# Patient Record
Sex: Female | Born: 1990 | Race: White | Hispanic: No | Marital: Single | Smoking: Former smoker
Health system: Southern US, Community
[De-identification: ages and names within clinical notes are randomized; demographics above are authoritative.]

## PROBLEM LIST (undated history)

## (undated) DIAGNOSIS — R000 Tachycardia, unspecified: Secondary | ICD-10-CM

## (undated) DIAGNOSIS — I499 Cardiac arrhythmia, unspecified: Secondary | ICD-10-CM

## (undated) DIAGNOSIS — F419 Anxiety disorder, unspecified: Secondary | ICD-10-CM

## (undated) DIAGNOSIS — R87629 Unspecified abnormal cytological findings in specimens from vagina: Secondary | ICD-10-CM

## (undated) DIAGNOSIS — F32A Depression, unspecified: Secondary | ICD-10-CM

## (undated) DIAGNOSIS — J45909 Unspecified asthma, uncomplicated: Secondary | ICD-10-CM

## (undated) HISTORY — DX: Unspecified abnormal cytological findings in specimens from vagina: R87.629

## (undated) HISTORY — DX: Unspecified asthma, uncomplicated: J45.909

## (undated) HISTORY — DX: Depression, unspecified: F32.A

## (undated) HISTORY — DX: Anxiety disorder, unspecified: F41.9

---
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
## 2009-02-20 ENCOUNTER — Emergency Department (HOSPITAL_COMMUNITY): Admission: EM | Admit: 2009-02-20 | Discharge: 2009-02-20 | Payer: Self-pay | Admitting: Emergency Medicine

## 2009-12-04 ENCOUNTER — Emergency Department (HOSPITAL_COMMUNITY): Admission: EM | Admit: 2009-12-04 | Discharge: 2009-12-04 | Payer: Self-pay | Admitting: Emergency Medicine

## 2010-06-27 ENCOUNTER — Emergency Department (HOSPITAL_COMMUNITY)
Admission: EM | Admit: 2010-06-27 | Discharge: 2010-06-28 | Disposition: A | Discharge status: Home / Self Care | Payer: Self-pay | Attending: Emergency Medicine | Admitting: Emergency Medicine

## 2010-06-27 DIAGNOSIS — I10 Essential (primary) hypertension: Secondary | ICD-10-CM | POA: Insufficient documentation

## 2010-06-27 DIAGNOSIS — L723 Sebaceous cyst: Secondary | ICD-10-CM | POA: Insufficient documentation

## 2010-12-07 LAB — CBC
HCT: 41.6
MCHC: 33.9
MCV: 85.8
Platelets: 314
RDW: 12.7
WBC: 11.1 — ABNORMAL HIGH

## 2010-12-07 LAB — BASIC METABOLIC PANEL
BUN: 13
CO2: 21
Chloride: 109
Glucose, Bld: 103 — ABNORMAL HIGH
Potassium: 4

## 2010-12-07 LAB — URINALYSIS, ROUTINE W REFLEX MICROSCOPIC
Bilirubin Urine: NEGATIVE
Ketones, ur: NEGATIVE
Nitrite: NEGATIVE
pH: 6

## 2010-12-07 LAB — DIFFERENTIAL
Eosinophils Absolute: 0.2
Eosinophils Relative: 2
Lymphs Abs: 4

## 2010-12-07 LAB — RAPID URINE DRUG SCREEN, HOSP PERFORMED
Benzodiazepines: NOT DETECTED
Cocaine: NOT DETECTED
Tetrahydrocannabinol: NOT DETECTED

## 2010-12-07 LAB — PREGNANCY, URINE: Preg Test, Ur: NEGATIVE

## 2012-04-06 ENCOUNTER — Other Ambulatory Visit: Payer: Self-pay

## 2012-04-06 ENCOUNTER — Emergency Department (HOSPITAL_COMMUNITY)
Admission: EM | Admit: 2012-04-06 | Discharge: 2012-04-06 | Disposition: A | Discharge status: Home / Self Care | Payer: Self-pay | Attending: Emergency Medicine | Admitting: Emergency Medicine

## 2012-04-06 ENCOUNTER — Encounter (HOSPITAL_COMMUNITY): Payer: Self-pay | Admitting: Emergency Medicine

## 2012-04-06 DIAGNOSIS — F1010 Alcohol abuse, uncomplicated: Secondary | ICD-10-CM | POA: Insufficient documentation

## 2012-04-06 DIAGNOSIS — F17200 Nicotine dependence, unspecified, uncomplicated: Secondary | ICD-10-CM | POA: Insufficient documentation

## 2012-04-06 DIAGNOSIS — R4182 Altered mental status, unspecified: Secondary | ICD-10-CM | POA: Insufficient documentation

## 2012-04-06 DIAGNOSIS — F10929 Alcohol use, unspecified with intoxication, unspecified: Secondary | ICD-10-CM

## 2012-04-06 HISTORY — DX: Tachycardia, unspecified: R00.0

## 2012-04-06 MED ORDER — SODIUM CHLORIDE 0.9 % IV BOLUS (SEPSIS)
1000.0000 mL | Freq: Once | INTRAVENOUS | Status: AC
  Administered 2012-04-06: 1000 mL via INTRAVENOUS

## 2012-04-06 NOTE — ED Notes (Addendum)
EMS brought patient in intoxicated from ETOH, pt admits to ETOH. Pt lethargic on triage but able to follow directions and answer questions. Vitals stable, resp stable. Denies any other drug use

## 2012-04-06 NOTE — ED Provider Notes (Signed)
History    22 year old female brought in by EMS for decreased mental status. Patient was sent home with her fianc when she laid down on the bed. He subsequently had difficulty arousing her. Patient had been drinking heavily earlier in the evening. Patient denies any other ingestion. Neysa Bonito was with her throughout the night. He is not concerned for any other ingestions aside from alcohol. No trauma. Patient in usual state of health earlier in the day. Currently her mental status is improved. Patient currently with no complaints. She has a past history of what sounds like some type of tachydysrhythmia and possibly an ablation. Currently denies any palpitations. No shortness of breath.   CSN: [id number]  Arrival date & time 04/06/12  0440   First MD Initiated Contact with Patient 04/06/12 [phone]      Chief Complaint  Patient presents with  . Alcohol Intoxication    (Consider location/radiation/quality/duration/timing/severity/associated sxs/prior treatment) HPI  Past Medical History  Diagnosis Date  . Tachycardia     Past Surgical History  Procedure Laterality Date  . Cardiac catheterization      states due to tacycardia    No family history on file.  History  Substance Use Topics  . Smoking status: Current Every Day Smoker  . Smokeless tobacco: Not on file  . Alcohol Use: Yes    OB History   Grav Para Term Preterm Abortions TAB SAB Ect Mult Living                  Review of Systems  All systems reviewed and negative, other than as noted in HPI.  Allergies  Review of patient's allergies indicates no known allergies.  Home Medications  No current outpatient prescriptions on file.  BP 136/98  Pulse 89  Temp(Src) 97.4 F (36.3 C) (Oral)  Ht 5\' 4"  (1.626 m)  Wt 132 lb (59.875 kg)  BMI 22.65 kg/m2  SpO2 100%  LMP 04/01/2012  Physical Exam  Nursing note and vitals reviewed. Constitutional: She appears well-developed and well-nourished. No distress.  No  external signs of acute trauma. Alcohol on breath.  HENT:  Head: Normocephalic and atraumatic.  Eyes: Pupils are equal, round, and reactive to light. Right eye exhibits no discharge. Left eye exhibits no discharge.  Conjunctiva injected bilaterally  Neck: Neck supple.  Cardiovascular: Normal rate, regular rhythm and normal heart sounds.  Exam reveals no gallop and no friction rub.   No murmur heard. Pulmonary/Chest: Effort normal and breath sounds normal. No respiratory distress.  Abdominal: Soft. She exhibits no distension. There is no tenderness.  Musculoskeletal: She exhibits no edema and no tenderness.  Neurological: She is alert. No cranial nerve deficit. She exhibits normal muscle tone. Coordination normal.  Speech is somewhat slow, but patient is answering questions appropriately.  Skin: Skin is warm and dry.  Psychiatric: Her behavior is normal. Thought content normal.    ED Course  Procedures (including critical care time)  Labs Reviewed - No data to display No results found.  EKG:  Rhythm: Sinus tachycardia Rate: 80 Axis: Normal Intervals: Normal ST segments: Nonspecific ST changes. There is some T wave flattening in precordial leads. Comparison: None  1. Alcohol intoxication       MDM  21yf with decreased mental status. Likely 2/2 etoh use. Drowsy on exam but protecting airway and is answering questions appropriately. Observed in ed for almost 2 hours with progressive improvement.         Raeford Razor, MD 04/06/12 [id number]

## 2014-03-04 LAB — OB RESULTS CONSOLE VARICELLA ZOSTER ANTIBODY, IGG: VARICELLA IGG: IMMUNE

## 2014-03-04 LAB — OB RESULTS CONSOLE HGB/HCT, BLOOD
HEMATOCRIT: 44 %
Hemoglobin: 14.3 g/dL

## 2014-03-04 LAB — OB RESULTS CONSOLE HEPATITIS B SURFACE ANTIGEN: HEP B S AG: NEGATIVE

## 2014-03-04 LAB — OB RESULTS CONSOLE ANTIBODY SCREEN: Antibody Screen: NEGATIVE

## 2014-03-04 LAB — OB RESULTS CONSOLE GC/CHLAMYDIA
Chlamydia: NEGATIVE
Gonorrhea: NEGATIVE

## 2014-03-04 LAB — OB RESULTS CONSOLE PLATELET COUNT: Platelets: 225 10*3/uL

## 2014-03-04 LAB — OB RESULTS CONSOLE RUBELLA ANTIBODY, IGM: RUBELLA: IMMUNE

## 2014-03-04 LAB — OB RESULTS CONSOLE ABO/RH: RH TYPE: POSITIVE

## 2014-03-04 LAB — OB RESULTS CONSOLE RPR: RPR: NONREACTIVE

## 2014-03-04 LAB — OB RESULTS CONSOLE HIV ANTIBODY (ROUTINE TESTING): HIV: NONREACTIVE

## 2014-03-04 LAB — HM PAP SMEAR: HM PAP: NEGATIVE

## 2014-04-26 DIAGNOSIS — I4719 Other supraventricular tachycardia: Secondary | ICD-10-CM

## 2014-04-26 DIAGNOSIS — Z8679 Personal history of other diseases of the circulatory system: Secondary | ICD-10-CM | POA: Insufficient documentation

## 2014-04-26 DIAGNOSIS — Z9889 Other specified postprocedural states: Secondary | ICD-10-CM

## 2014-04-26 DIAGNOSIS — I471 Supraventricular tachycardia: Secondary | ICD-10-CM | POA: Insufficient documentation

## 2014-04-26 HISTORY — DX: Other specified postprocedural states: Z98.890

## 2014-04-26 HISTORY — DX: Personal history of other diseases of the circulatory system: Z86.79

## 2014-04-26 HISTORY — DX: Other supraventricular tachycardia: I47.19

## 2014-04-26 HISTORY — DX: Supraventricular tachycardia: I47.1

## 2014-05-07 ENCOUNTER — Encounter: Payer: Self-pay | Admitting: *Deleted

## 2014-05-07 DIAGNOSIS — R87619 Unspecified abnormal cytological findings in specimens from cervix uteri: Secondary | ICD-10-CM | POA: Insufficient documentation

## 2014-05-11 ENCOUNTER — Ambulatory Visit (INDEPENDENT_AMBULATORY_CARE_PROVIDER_SITE_OTHER): Payer: Medicaid Other | Admitting: Advanced Practice Midwife

## 2014-05-11 ENCOUNTER — Encounter: Payer: Self-pay | Admitting: Advanced Practice Midwife

## 2014-05-11 VITALS — BP 100/60 | HR 72 | Wt 117.0 lb

## 2014-05-11 DIAGNOSIS — R000 Tachycardia, unspecified: Secondary | ICD-10-CM | POA: Insufficient documentation

## 2014-05-11 DIAGNOSIS — Z1389 Encounter for screening for other disorder: Secondary | ICD-10-CM

## 2014-05-11 DIAGNOSIS — Z3492 Encounter for supervision of normal pregnancy, unspecified, second trimester: Secondary | ICD-10-CM

## 2014-05-11 DIAGNOSIS — Z0283 Encounter for blood-alcohol and blood-drug test: Secondary | ICD-10-CM

## 2014-05-11 DIAGNOSIS — Z331 Pregnant state, incidental: Secondary | ICD-10-CM

## 2014-05-11 DIAGNOSIS — O09899 Supervision of other high risk pregnancies, unspecified trimester: Secondary | ICD-10-CM | POA: Insufficient documentation

## 2014-05-11 MED ORDER — CITRANATAL ASSURE 35-1 & 300 MG PO MISC: 1.0000 | Freq: Every day | ORAL | Status: DC

## 2014-05-11 NOTE — Progress Notes (Signed)
  Subjective:    Mary Meyer is a G1P0 6540w3d being seen today for her first obstetrical visit.  Her obstetrical history is significant for smoker.  Has decreased significantly Patient reports no complaints.  [facility]  Pt had a cardiac cath/ablation for SVT 2009 and thinks she had a stent placed, but records do not reflect this  Had consult 04/26/14 and report "all was normal".  Records requested   Filed Vitals:   05/11/14 1023  BP: 100/60  Pulse: 72  Weight: 117 lb (53.071 kg)    HISTORY: OB History  Gravida Para Term Preterm AB SAB TAB Ectopic Multiple Living  1             # Outcome Date GA Lbr Len/2nd Weight Sex Delivery Anes PTL Lv  1 Current              Past Medical History  Diagnosis Date  . Tachycardia    Past Surgical History  Procedure Laterality Date  . Cardiac catheterization  2009    states due to tacycardia, had alblation  . Cardiac catheterization  2009   History reviewed. No pertinent family history.   Exam                       Uterus Normal, Gravid, FH: u-3                System:     Skin: normal coloration and turgor, no rashes    Neurologic: oriented, normal, normal mood   Extremities: normal strength, tone, and muscle mass   HEENT PERRLA   Mouth/Teeth mucous membranes moist, normal dentition   Neck supple and no masses   Cardiovascular: regular rate and rhythm   Respiratory:  appears well, vitals normal, no respiratory distress, acyanotic   Abdomen: soft, non-tender;  FHR: 157          Assessment:    Pregnancy: G1P0 Patient Active Problem List   Diagnosis Date Noted  . Supervision of normal pregnancy 05/11/2014  . Tachycardia   . Abnormal Pap smear of cervix 05/07/2014        Plan:     IContinue prenatal vitamins  Problem list reviewed and updated  Reviewed recommended weight gain based on pre-gravid BMI  Encouraged well-balanced diet Genetic Screening  discussed Integrated Screen: normal.  Ultrasound discussed; fetal survey: requested.  Follow up in 1 weeks for anatomy scan.  CRESENZO-DISHMAN,Yamili Lichtenwalner 05/11/2014

## 2014-05-11 NOTE — Progress Notes (Signed)
New OB visit here at our office. [facility].

## 2014-05-12 LAB — PMP SCREEN PROFILE (10S), URINE
Amphetamine Screen, Ur: NEGATIVE ng/mL
BARBITURATE SCRN UR: NEGATIVE ng/mL
BENZODIAZEPINE SCREEN, URINE: NEGATIVE ng/mL
CREATININE(CRT), U: 93.8 mg/dL (ref 20.0–300.0)
Cannabinoids Ur Ql Scn: NEGATIVE ng/mL
Cocaine(Metab.)Screen, Urine: NEGATIVE ng/mL
METHADONE SCREEN, URINE: NEGATIVE ng/mL
OXYCODONE+OXYMORPHONE UR QL SCN: NEGATIVE ng/mL
Opiate Scrn, Ur: NEGATIVE ng/mL
PCP Scrn, Ur: NEGATIVE ng/mL
PROPOXYPHENE SCREEN: NEGATIVE ng/mL
Ph of Urine: 7.8 (ref 4.5–8.9)

## 2014-05-18 ENCOUNTER — Other Ambulatory Visit: Payer: Self-pay | Admitting: Advanced Practice Midwife

## 2014-05-18 DIAGNOSIS — Z3689 Encounter for other specified antenatal screening: Secondary | ICD-10-CM

## 2014-05-19 ENCOUNTER — Ambulatory Visit (INDEPENDENT_AMBULATORY_CARE_PROVIDER_SITE_OTHER): Payer: Medicaid Other

## 2014-05-19 ENCOUNTER — Ambulatory Visit (INDEPENDENT_AMBULATORY_CARE_PROVIDER_SITE_OTHER): Payer: Medicaid Other | Admitting: Advanced Practice Midwife

## 2014-05-19 VITALS — BP 114/62 | HR 80 | Wt 121.0 lb

## 2014-05-19 DIAGNOSIS — Z1389 Encounter for screening for other disorder: Secondary | ICD-10-CM

## 2014-05-19 DIAGNOSIS — Z331 Pregnant state, incidental: Secondary | ICD-10-CM

## 2014-05-19 DIAGNOSIS — Z36 Encounter for antenatal screening of mother: Secondary | ICD-10-CM

## 2014-05-19 DIAGNOSIS — Z3689 Encounter for other specified antenatal screening: Secondary | ICD-10-CM

## 2014-05-19 DIAGNOSIS — Z3492 Encounter for supervision of normal pregnancy, unspecified, second trimester: Secondary | ICD-10-CM

## 2014-05-19 LAB — POCT URINALYSIS DIPSTICK
Blood, UA: NEGATIVE
GLUCOSE UA: NEGATIVE
Glucose, UA: NEGATIVE
Ketones, UA: NEGATIVE
NITRITE UA: NEGATIVE
PROTEIN UA: NEGATIVE
Protein, UA: NEGATIVE

## 2014-05-19 NOTE — Progress Notes (Signed)
G1P0 4752w4d Estimated Date of Delivery: 10/09/14  Blood pressure 114/62, pulse 80, weight 121 lb (54.885 kg), last menstrual period 01/11/2014.   BP weight and urine results all reviewed and noted.  Please refer to the obstetrical flow sheet for the fundal height and fetal heart rate documentation: Had anatomy scan today:  U/S 4815w1d active fetus, pos fht 157bpm,ant pl grade 0,bilat adnexa wnl,sdp of fluid 5.34cm,anatomy appears to be normal,female  Patient reports good fetal movement, denies any bleeding and no rupture of membranes symptoms or regular contractions. Patient is without complaints. All questions were answered.  Plan:  Continued routine obstetrical care,   Follow up in 4 weeks for OB appointment,

## 2014-05-19 NOTE — Progress Notes (Signed)
U/S 3863w1d active fetus, pos fht 157bpm,ant pl grade 0,bilat adnexa wnl,sdp of fluid 5.34cm,anatomy appears to be normal,female

## 2014-06-16 ENCOUNTER — Ambulatory Visit (INDEPENDENT_AMBULATORY_CARE_PROVIDER_SITE_OTHER): Payer: Medicaid Other | Admitting: Women's Health

## 2014-06-16 ENCOUNTER — Encounter: Payer: Self-pay | Admitting: Women's Health

## 2014-06-16 VITALS — BP 110/60 | HR 88 | Wt 129.5 lb

## 2014-06-16 DIAGNOSIS — Z1389 Encounter for screening for other disorder: Secondary | ICD-10-CM

## 2014-06-16 DIAGNOSIS — Z23 Encounter for immunization: Secondary | ICD-10-CM

## 2014-06-16 DIAGNOSIS — Z3402 Encounter for supervision of normal first pregnancy, second trimester: Secondary | ICD-10-CM

## 2014-06-16 DIAGNOSIS — Z331 Pregnant state, incidental: Secondary | ICD-10-CM

## 2014-06-16 LAB — POCT URINALYSIS DIPSTICK
Blood, UA: NEGATIVE
GLUCOSE UA: NEGATIVE
KETONES UA: NEGATIVE
NITRITE UA: NEGATIVE
Protein, UA: NEGATIVE

## 2014-06-16 NOTE — Progress Notes (Signed)
Low-risk OB appointment G1P0 7480w4d Estimated Date of Delivery: 10/09/14 BP 110/60 mmHg  Pulse 88  Wt 129 lb 8 oz (58.741 kg)  LMP 01/11/2014  BP, weight, and urine reviewed.  Refer to obstetrical flow sheet for FH & FHR.  Reports good fm.  Denies regular uc's, lof, vb, or uti s/s. No complaints. No circ. Discussed cb classes- fob works 2nd so will be unable to attend and she doesn't have anyone else to take her. [facility]  Does not want to drink glucola next visit- has read a lot about it and is concerned w/ chemicals, etc. Wants to do 28 brach's jelly beans- understands this is not as sensitive. Discussed peds- her sister does not take her kids to peds and does home/natural remedies and she is looking into this. Advised against this- discussed local peds & info given.  Reviewed ptl s/s, fm. Plan:  Continue routine obstetrical care  F/U in 4wks for OB appointment and pn2 w/ jelly beans

## 2014-06-16 NOTE — Addendum Note (Signed)
Addended by: Richardson ChiquitoRAVIS, ASHLEY M on: 06/16/2014 11:21 AM   Modules accepted: Orders

## 2014-06-16 NOTE — Patient Instructions (Signed)
BRING BRACH'S JELLY BEANS WITH YOU (YOU WILL NEED 28)  [place] Pediatricians/Family Doctors:  [facility] [phone]            [facility] [phone]                 [facility] [phone] (usually not accepting new patients unless you have family there already, you are always welcome to call and ask)            [facility] [phone]([address] [place]) [phone]   [facility] Pediatricians/Family Doctors:   [facility]: [phone]  [facility]: [phone]   You will have your sugar test next visit.  Please do not eat or drink anything after midnight the night before you come, not even water.  You will be here for at least two hours.     Call the office [phone]) or go to [facility] if:  You begin to have strong, frequent contractions  Your water breaks.  Sometimes it is a big gush of fluid, sometimes it is just a trickle that keeps getting your panties wet or running down your legs  You have vaginal bleeding.  It is normal to have a small amount of spotting if your cervix was checked.   You don't feel your baby moving like normal.  If you don't, get you something to eat and drink and lay down and focus on feeling your baby move.   If your baby is still not moving like normal, you should call the office or go to [facility].  Second Trimester of Pregnancy The second trimester is from week 13 through week 28, months 4 through 6. The second trimester is often a time when you feel your best. Your body has also adjusted to being pregnant, and you begin to feel better physically. Usually, morning sickness has lessened or quit completely, you may have more energy, and you may have an increase in appetite. The second trimester is also a time when the fetus is growing rapidly. At the end of the sixth month, the fetus is about 9 inches long and weighs about 1 pounds. You will likely begin to  feel the baby move (quickening) between 18 and 20 weeks of the pregnancy. BODY CHANGES Your body goes through many changes during pregnancy. The changes vary from woman to woman.   Your weight will continue to increase. You will notice your lower abdomen bulging out.  You may begin to get stretch marks on your hips, abdomen, and breasts.  You may develop headaches that can be relieved by medicines approved by your health care provider.  You may urinate more often because the fetus is pressing on your bladder.  You may develop or continue to have heartburn as a result of your pregnancy.  You may develop constipation because certain hormones are causing the muscles that push waste through your intestines to slow down.  You may develop hemorrhoids or swollen, bulging veins (varicose veins).  You may have back pain because of the weight gain and pregnancy hormones relaxing your joints between the bones in your pelvis and as a result of a shift in weight and the muscles that support your balance.  Your breasts will continue to grow and be tender.  Your gums may bleed and may be sensitive to brushing and flossing.  Dark spots or blotches (chloasma, mask of pregnancy) may develop on your face. This will likely fade after the baby is born.  A dark line from your belly button to the pubic area (linea nigra) may appear. This will likely fade after the baby is born.  You may have changes in your hair. These can include thickening of your hair, rapid growth, and changes in texture. Some women also have hair loss during or after pregnancy, or hair that feels dry or thin. Your hair will most likely return to normal after your baby is born. WHAT TO EXPECT AT YOUR PRENATAL VISITS During a routine prenatal visit:  You will be weighed to make sure you and the fetus are growing normally.  Your blood pressure will be taken.  Your abdomen will be measured to track your baby's growth.  The fetal  heartbeat will be listened to.  Any test results from the previous visit will be discussed. Your health care provider may ask you:  How you are feeling.  If you are feeling the baby move.  If you have had any abnormal symptoms, such as leaking fluid, bleeding, severe headaches, or abdominal cramping.  If you have any questions. Other tests that may be performed during your second trimester include:  Blood tests that check for:  Low iron levels (anemia).  Gestational diabetes (between 24 and 28 weeks).  Rh antibodies.  Urine tests to check for infections, diabetes, or protein in the urine.  An ultrasound to confirm the proper growth and development of the baby.  An amniocentesis to check for possible genetic problems.  Fetal screens for spina bifida and Down syndrome. HOME CARE INSTRUCTIONS   Avoid all smoking, herbs, alcohol, and unprescribed drugs. These chemicals affect the formation and growth of the baby.  Follow your health care provider's instructions regarding medicine use. There are medicines that are either safe or unsafe to take during pregnancy.  Exercise only as directed by your health care provider. Experiencing uterine cramps is a good sign to stop exercising.  Continue to eat regular, healthy meals.  Wear a good support bra for breast tenderness.  Do not use hot tubs, steam rooms, or saunas.  Wear your seat belt at all times when driving.  Avoid raw meat, uncooked cheese, cat litter boxes, and soil used by cats. These carry germs that can cause birth defects in the baby.  Take your prenatal vitamins.  Try taking a stool softener (if your health care provider approves) if you develop constipation. Eat more high-fiber foods, such as fresh vegetables or fruit and whole grains. Drink plenty of fluids to keep your urine clear or pale yellow.  Take warm sitz baths to soothe any pain or discomfort caused by hemorrhoids. Use hemorrhoid cream if your health  care provider approves.  If you develop varicose veins, wear support hose. Elevate your feet for 15 minutes, 3-4 times a day. Limit salt in your diet.  Avoid heavy lifting, wear low heel shoes, and practice good posture.  Rest with your legs elevated if you have leg cramps or low back pain.  Visit your dentist if you have not gone yet during your pregnancy. Use a soft toothbrush to brush your teeth and be gentle when you floss.  A sexual relationship may be continued unless your health care provider directs you otherwise.  Continue to go to all your prenatal visits as directed by your health care provider. SEEK MEDICAL CARE IF:   You have dizziness.  You have mild pelvic cramps, pelvic pressure, or nagging pain in the abdominal area.  You have persistent nausea, vomiting, or diarrhea.  You have a bad smelling vaginal discharge.  You have pain with urination. SEEK IMMEDIATE MEDICAL CARE IF:   You have a fever.  You are leaking fluid from your vagina.  You have spotting or bleeding from your vagina.  You have severe abdominal cramping or pain.  You have rapid weight gain or loss.  You have shortness of breath with chest pain.  You notice sudden or extreme swelling of your face, hands, ankles, feet, or legs.  You have not felt your baby move in over an hour.  You have severe headaches that do not go away with medicine.  You have vision changes. Document Released: 02/06/2001 Document Revised: 02/17/2013 Document Reviewed: 04/15/2012 [facility] Patient Information 2015 [place], [place]. This information is not intended to replace advice given to you by your health care provider. Make sure you discuss any questions you have with your health care provider.

## 2014-07-14 ENCOUNTER — Ambulatory Visit (INDEPENDENT_AMBULATORY_CARE_PROVIDER_SITE_OTHER): Payer: Medicaid Other | Admitting: Women's Health

## 2014-07-14 ENCOUNTER — Other Ambulatory Visit: Payer: Medicaid Other

## 2014-07-14 ENCOUNTER — Encounter: Payer: Self-pay | Admitting: Women's Health

## 2014-07-14 VITALS — BP 110/62 | HR 68 | Wt 137.0 lb

## 2014-07-14 DIAGNOSIS — Z3402 Encounter for supervision of normal first pregnancy, second trimester: Secondary | ICD-10-CM

## 2014-07-14 DIAGNOSIS — Z331 Pregnant state, incidental: Secondary | ICD-10-CM

## 2014-07-14 DIAGNOSIS — Z369 Encounter for antenatal screening, unspecified: Secondary | ICD-10-CM

## 2014-07-14 DIAGNOSIS — Z131 Encounter for screening for diabetes mellitus: Secondary | ICD-10-CM

## 2014-07-14 DIAGNOSIS — Z1389 Encounter for screening for other disorder: Secondary | ICD-10-CM

## 2014-07-14 DIAGNOSIS — R000 Tachycardia, unspecified: Secondary | ICD-10-CM

## 2014-07-14 LAB — POCT URINALYSIS DIPSTICK
Blood, UA: NEGATIVE
Glucose, UA: NEGATIVE
Ketones, UA: NEGATIVE
Leukocytes, UA: NEGATIVE
NITRITE UA: NEGATIVE
Protein, UA: NEGATIVE

## 2014-07-14 NOTE — Patient Instructions (Signed)

## 2014-07-14 NOTE — Progress Notes (Signed)
Low-risk OB appointment G1P0 3342w4d Estimated Date of Delivery: 10/09/14 BP 110/62 mmHg  Pulse 68  Wt 137 lb (62.143 kg)  LMP 01/11/2014  BP, weight, and urine reviewed.  Refer to obstetrical flow sheet for FH & FHR.  Reports good fm.  Denies regular uc's, lof, vb, or uti s/s. No complaints. Reviewed ptl s/s, fkc. Recommended Tdap at HD/PCP per CDC guidelines.  Plan:  Continue routine obstetrical care  F/U in 3wks for OB appointment  PN2 today (decided not to do jelly beans, did glucola)

## 2014-07-15 LAB — CBC
HEMATOCRIT: 35.6 % (ref 34.0–46.6)
HEMOGLOBIN: 11.9 g/dL (ref 11.1–15.9)
MCH: 30.9 pg (ref 26.6–33.0)
MCHC: 33.4 g/dL (ref 31.5–35.7)
MCV: 93 fL (ref 79–97)
Platelets: 198 10*3/uL (ref 150–379)
RBC: 3.85 x10E6/uL (ref 3.77–5.28)
RDW: 13.9 % (ref 12.3–15.4)
WBC: 9.4 10*3/uL (ref 3.4–10.8)

## 2014-07-15 LAB — ANTIBODY SCREEN: ANTIBODY SCREEN: NEGATIVE

## 2014-07-15 LAB — GLUCOSE TOLERANCE, 2 HOURS W/ 1HR
Glucose, 1 hour: 99 mg/dL (ref 65–179)
Glucose, 2 hour: 97 mg/dL (ref 65–152)
Glucose, Fasting: 71 mg/dL (ref 65–91)

## 2014-07-15 LAB — HIV ANTIBODY (ROUTINE TESTING W REFLEX): HIV SCREEN 4TH GENERATION: NONREACTIVE

## 2014-07-15 LAB — RPR: RPR Ser Ql: NONREACTIVE

## 2014-07-15 LAB — HSV 2 ANTIBODY, IGG: HSV 2 Glycoprotein G Ab, IgG: <0.91 index (ref 0.00–0.90)

## 2014-07-19 ENCOUNTER — Ambulatory Visit (INDEPENDENT_AMBULATORY_CARE_PROVIDER_SITE_OTHER): Payer: Medicaid Other | Admitting: Advanced Practice Midwife

## 2014-07-19 ENCOUNTER — Encounter: Payer: Self-pay | Admitting: Advanced Practice Midwife

## 2014-07-19 VITALS — BP 102/58 | HR 72 | Wt 137.0 lb

## 2014-07-19 DIAGNOSIS — O368131 Decreased fetal movements, third trimester, fetus 1: Secondary | ICD-10-CM

## 2014-07-19 DIAGNOSIS — Z331 Pregnant state, incidental: Secondary | ICD-10-CM

## 2014-07-19 DIAGNOSIS — Z1389 Encounter for screening for other disorder: Secondary | ICD-10-CM

## 2014-07-19 LAB — POCT URINALYSIS DIPSTICK
GLUCOSE UA: NEGATIVE
KETONES UA: NEGATIVE
LEUKOCYTES UA: NEGATIVE
Nitrite, UA: NEGATIVE
Protein, UA: NEGATIVE
RBC UA: NEGATIVE

## 2014-07-19 NOTE — Progress Notes (Signed)
WORK IN:  FELL ON HANDS AND KNEES @ 1700 YESTERDAY; DECREASED fm TODAY, no bleeding G1P0 6676w2d Estimated Date of Delivery: 10/09/14  Blood pressure 102/58, pulse 72, weight 137 lb (62.143 kg), last menstrual period 01/11/2014.   BP weight and urine results all reviewed and noted..NST reassuring with moderate variability and 10X10 accels; no uterine activity, uterus soft, nontender.  All questions were answered.  Plan:  Continued routine obstetrical care, pt reassured and advised to seek FU care asap if she falls agian  Follow up in as scheduled for OB appointment,

## 2014-08-04 ENCOUNTER — Ambulatory Visit (INDEPENDENT_AMBULATORY_CARE_PROVIDER_SITE_OTHER): Payer: Medicaid Other | Admitting: Advanced Practice Midwife

## 2014-08-04 VITALS — BP 92/54 | HR 77 | Wt 140.5 lb

## 2014-08-04 DIAGNOSIS — Z3402 Encounter for supervision of normal first pregnancy, second trimester: Secondary | ICD-10-CM

## 2014-08-04 DIAGNOSIS — Z1389 Encounter for screening for other disorder: Secondary | ICD-10-CM

## 2014-08-04 DIAGNOSIS — Z3403 Encounter for supervision of normal first pregnancy, third trimester: Secondary | ICD-10-CM

## 2014-08-04 DIAGNOSIS — Z331 Pregnant state, incidental: Secondary | ICD-10-CM

## 2014-08-04 LAB — POCT URINALYSIS DIPSTICK
Blood, UA: NEGATIVE
GLUCOSE UA: NEGATIVE
Ketones, UA: NEGATIVE
LEUKOCYTES UA: NEGATIVE
NITRITE UA: NEGATIVE
Protein, UA: NEGATIVE

## 2014-08-04 NOTE — Progress Notes (Signed)
G1P0 3113w4d Estimated Date of Delivery: 10/09/14  Blood pressure 92/54, pulse 77, weight 140 lb 8 oz (63.73 kg), last menstrual period 01/11/2014.   BP weight and urine results all reviewed and noted.  Please refer to the obstetrical flow sheet for the fundal height and fetal heart rate documentation:  Patient reports good fetal movement, denies any bleeding and no rupture of membranes symptoms or regular contractions. Patient c/o hemorrhoids that sometimes hurt sometimes not.  Declines exam.  All questions were answered.  Plan:  Continued routine obstetrical care,   Follow up in 2 weeks for OB appointment,

## 2014-08-18 ENCOUNTER — Encounter: Payer: Self-pay | Admitting: Women's Health

## 2014-08-18 ENCOUNTER — Ambulatory Visit (INDEPENDENT_AMBULATORY_CARE_PROVIDER_SITE_OTHER): Payer: Medicaid Other | Admitting: Women's Health

## 2014-08-18 VITALS — BP 104/62 | HR 68 | Wt 142.0 lb

## 2014-08-18 DIAGNOSIS — Z3403 Encounter for supervision of normal first pregnancy, third trimester: Secondary | ICD-10-CM

## 2014-08-18 DIAGNOSIS — Z1389 Encounter for screening for other disorder: Secondary | ICD-10-CM

## 2014-08-18 DIAGNOSIS — Z331 Pregnant state, incidental: Secondary | ICD-10-CM

## 2014-08-18 LAB — POCT URINALYSIS DIPSTICK
Blood, UA: NEGATIVE
Glucose, UA: NEGATIVE
Ketones, UA: NEGATIVE
LEUKOCYTES UA: NEGATIVE
Nitrite, UA: NEGATIVE
PROTEIN UA: NEGATIVE

## 2014-08-18 NOTE — Progress Notes (Signed)
Low-risk OB appointment G1P0 [redacted]w[redacted]d Estimated Date of Delivery: 10/09/14 BP 104/62 mmHg  Pulse 68  Wt 142 lb (64.411 kg)  LMP 01/11/2014  BP, weight, and urine reviewed.  Refer to obstetrical flow sheet for FH & FHR.  Reports good fm.  Denies regular uc's, lof, vb, or uti s/s. No complaints. Picked out ped. Wants to go to tour- gave info again.  Reviewed ptl s/s, fkc. Plan:  Continue routine obstetrical care  F/U in 2wks for OB appointment

## 2014-08-18 NOTE — Patient Instructions (Signed)
Call the office ([phone]) or go to [facility] if:  You begin to have strong, frequent contractions  Your water breaks.  Sometimes it is a big gush of fluid, sometimes it is just a trickle that keeps getting your panties wet or running down your legs  You have vaginal bleeding.  It is normal to have a small amount of spotting if your cervix was checked.   You don't feel your baby moving like normal.  If you don't, get you something to eat and drink and lay down and focus on feeling your baby move.  You should feel at least 10 movements in 2 hours.  If you don't, you should call the office or go to [facility].    Preterm Labor Information Preterm labor is when labor starts at less than 37 weeks of pregnancy. The normal length of a pregnancy is 39 to 41 weeks. CAUSES Often, there is no identifiable underlying cause as to why a woman goes into preterm labor. One of the most common known causes of preterm labor is infection. Infections of the uterus, cervix, vagina, amniotic sac, bladder, kidney, or even the lungs (pneumonia) can cause labor to start. Other suspected causes of preterm labor include:   Urogenital infections, such as yeast infections and bacterial vaginosis.   Uterine abnormalities (uterine shape, uterine septum, fibroids, or bleeding from the placenta).   A cervix that has been operated on (it may fail to stay closed).   Malformations in the fetus.   Multiple gestations (twins, triplets, and so on).   Breakage of the amniotic sac.  RISK FACTORS  Having a previous history of preterm labor.   Having premature rupture of membranes (PROM).   Having a placenta that covers the opening of the cervix (placenta previa).   Having a placenta that separates from the uterus (placental abruption).   Having a cervix that is too weak to hold the fetus in the uterus (incompetent cervix).   Having too much fluid in the amniotic sac (polyhydramnios).   Taking  illegal drugs or smoking while pregnant.   Not gaining enough weight while pregnant.   Being younger than 18 and older than 25 years old.   Having a low socioeconomic status.   Being African American. SYMPTOMS Signs and symptoms of preterm labor include:   Menstrual-like cramps, abdominal pain, or back pain.  Uterine contractions that are regular, as frequent as six in an hour, regardless of their intensity (may be mild or painful).  Contractions that start on the top of the uterus and spread down to the lower abdomen and back.   A sense of increased pelvic pressure.   A watery or bloody mucus discharge that comes from the vagina.  TREATMENT Depending on the length of the pregnancy and other circumstances, your health care provider may suggest bed rest. If necessary, there are medicines that can be given to stop contractions and to mature the fetal lungs. If labor happens before 34 weeks of pregnancy, a prolonged hospital stay may be recommended. Treatment depends on the condition of both you and the fetus.  WHAT SHOULD YOU DO IF YOU THINK YOU ARE IN PRETERM LABOR? Call your health care provider right away. You will need to go to the hospital to get checked immediately. HOW CAN YOU PREVENT PRETERM LABOR IN FUTURE PREGNANCIES? You should:   Stop smoking if you smoke.  Maintain healthy weight gain and avoid chemicals and drugs that are not necessary.  Be watchful for   any type of infection.  Inform your health care provider if you have a known history of preterm labor. Document Released: 05/05/2003 Document Revised: 10/15/2012 Document Reviewed: 03/17/2012 [employer] Patient Information 2015 [employer]. This information is not intended to replace advice given to you by your health care provider. Make sure you discuss any questions you have with your health care provider.  

## 2014-08-25 ENCOUNTER — Inpatient Hospital Stay (HOSPITAL_COMMUNITY): Admission: AD | Admit: 2014-08-25 | Payer: Self-pay | Source: Ambulatory Visit | Admitting: Obstetrics & Gynecology

## 2014-09-01 ENCOUNTER — Encounter: Payer: Self-pay | Admitting: Obstetrics and Gynecology

## 2014-09-01 ENCOUNTER — Ambulatory Visit (INDEPENDENT_AMBULATORY_CARE_PROVIDER_SITE_OTHER): Payer: Medicaid Other | Admitting: Obstetrics and Gynecology

## 2014-09-01 VITALS — BP 110/60 | HR 84 | Wt 146.0 lb

## 2014-09-01 DIAGNOSIS — Z3402 Encounter for supervision of normal first pregnancy, second trimester: Secondary | ICD-10-CM

## 2014-09-01 DIAGNOSIS — Z1389 Encounter for screening for other disorder: Secondary | ICD-10-CM

## 2014-09-01 DIAGNOSIS — Z331 Pregnant state, incidental: Secondary | ICD-10-CM

## 2014-09-01 LAB — POCT URINALYSIS DIPSTICK
GLUCOSE UA: NEGATIVE
Ketones, UA: NEGATIVE
Leukocytes, UA: NEGATIVE
NITRITE UA: NEGATIVE
PROTEIN UA: NEGATIVE
RBC UA: NEGATIVE

## 2014-09-01 NOTE — Progress Notes (Signed)
Pt denies any problems or concerns at this time. LROB G1P0 2822w4d Estimated Date of Delivery: 10/09/14  Blood pressure 110/60, pulse 84, weight 146 lb (66.225 kg), last menstrual period 01/11/2014.   refer to the ob flow sheet for FH and FHR, also BP, Wt, Urine results:notable for negative  Patient reports   good fetal movement, denies any bleeding and no rupture of membranes symptoms or regular contractions. Patient complaints:none , has a few braxton hicks. .  Questions were answered. Assessment: LROB, NO COMPLAINTS G1P0 Plan:  Continued routine obstetrical care,   F/u in 2 weeks for lrob visit

## 2014-09-15 ENCOUNTER — Ambulatory Visit (INDEPENDENT_AMBULATORY_CARE_PROVIDER_SITE_OTHER): Payer: Medicaid Other

## 2014-09-15 ENCOUNTER — Other Ambulatory Visit: Payer: Self-pay | Admitting: Women's Health

## 2014-09-15 ENCOUNTER — Ambulatory Visit (INDEPENDENT_AMBULATORY_CARE_PROVIDER_SITE_OTHER): Payer: Medicaid Other | Admitting: Women's Health

## 2014-09-15 ENCOUNTER — Encounter: Payer: Self-pay | Admitting: Women's Health

## 2014-09-15 VITALS — BP 120/68 | HR 76 | Wt 148.0 lb

## 2014-09-15 DIAGNOSIS — Z1159 Encounter for screening for other viral diseases: Secondary | ICD-10-CM

## 2014-09-15 DIAGNOSIS — O26843 Uterine size-date discrepancy, third trimester: Secondary | ICD-10-CM | POA: Diagnosis not present

## 2014-09-15 DIAGNOSIS — Z3403 Encounter for supervision of normal first pregnancy, third trimester: Secondary | ICD-10-CM

## 2014-09-15 DIAGNOSIS — O403XX0 Polyhydramnios, third trimester, not applicable or unspecified: Secondary | ICD-10-CM | POA: Insufficient documentation

## 2014-09-15 DIAGNOSIS — Z118 Encounter for screening for other infectious and parasitic diseases: Secondary | ICD-10-CM

## 2014-09-15 DIAGNOSIS — O403XX1 Polyhydramnios, third trimester, fetus 1: Secondary | ICD-10-CM

## 2014-09-15 DIAGNOSIS — Z1389 Encounter for screening for other disorder: Secondary | ICD-10-CM

## 2014-09-15 DIAGNOSIS — Z3685 Encounter for antenatal screening for Streptococcus B: Secondary | ICD-10-CM

## 2014-09-15 DIAGNOSIS — Z331 Pregnant state, incidental: Secondary | ICD-10-CM

## 2014-09-15 DIAGNOSIS — O09893 Supervision of other high risk pregnancies, third trimester: Secondary | ICD-10-CM

## 2014-09-15 LAB — OB RESULTS CONSOLE GBS: STREP GROUP B AG: NEGATIVE

## 2014-09-15 LAB — POCT URINALYSIS DIPSTICK
Blood, UA: NEGATIVE
Glucose, UA: NEGATIVE
Ketones, UA: NEGATIVE
NITRITE UA: NEGATIVE
Protein, UA: NEGATIVE

## 2014-09-15 NOTE — Progress Notes (Signed)
US 36+4wks,ant pl gr 3,cephalic,bilat adnexa's wnl,fht 138bpm,afi 24cm,sdp 8.8cm polyhydramnios, EFW 2932 g (47%) which is consistent with dating. BPP 6/8 (-respiration).

## 2014-09-15 NOTE — Patient Instructions (Addendum)
Call the office ([phone]) or go to [facility] if:  You begin to have strong, frequent contractions  Your water breaks.  Sometimes it is a big gush of fluid, sometimes it is just a trickle that keeps getting your panties wet or running down your legs  You have vaginal bleeding.  It is normal to have a small amount of spotting if your cervix was checked.   You don't feel your baby moving like normal.  If you don't, get you something to eat and drink and lay down and focus on feeling your baby move.  You should feel at least 10 movements in 2 hours.  If you don't, you should call the office or go to [facility].    Preterm Labor Information Preterm labor is when labor starts at less than 37 weeks of pregnancy. The normal length of a pregnancy is 39 to 41 weeks. CAUSES Often, there is no identifiable underlying cause as to why a woman goes into preterm labor. One of the most common known causes of preterm labor is infection. Infections of the uterus, cervix, vagina, amniotic sac, bladder, kidney, or even the lungs (pneumonia) can cause labor to start. Other suspected causes of preterm labor include:   Urogenital infections, such as yeast infections and bacterial vaginosis.   Uterine abnormalities (uterine shape, uterine septum, fibroids, or bleeding from the placenta).   A cervix that has been operated on (it may fail to stay closed).   Malformations in the fetus.   Multiple gestations (twins, triplets, and so on).   Breakage of the amniotic sac.  RISK FACTORS  Having a previous history of preterm labor.   Having premature rupture of membranes (PROM).   Having a placenta that covers the opening of the cervix (placenta previa).   Having a placenta that separates from the uterus (placental abruption).   Having a cervix that is too weak to hold the fetus in the uterus (incompetent cervix).   Having too much fluid in the amniotic sac (polyhydramnios).   Taking  illegal drugs or smoking while pregnant.   Not gaining enough weight while pregnant.   Being younger than 18 and older than 25 years old.   Having a low socioeconomic status.   Being African American. SYMPTOMS Signs and symptoms of preterm labor include:   Menstrual-like cramps, abdominal pain, or back pain.  Uterine contractions that are regular, as frequent as six in an hour, regardless of their intensity (may be mild or painful).  Contractions that start on the top of the uterus and spread down to the lower abdomen and back.   A sense of increased pelvic pressure.   A watery or bloody mucus discharge that comes from the vagina.  TREATMENT Depending on the length of the pregnancy and other circumstances, your health care provider may suggest bed rest. If necessary, there are medicines that can be given to stop contractions and to mature the fetal lungs. If labor happens before 34 weeks of pregnancy, a prolonged hospital stay may be recommended. Treatment depends on the condition of both you and the fetus.  WHAT SHOULD YOU DO IF YOU THINK YOU ARE IN PRETERM LABOR? Call your health care provider right away. You will need to go to the hospital to get checked immediately. HOW CAN YOU PREVENT PRETERM LABOR IN FUTURE PREGNANCIES? You should:   Stop smoking if you smoke.  Maintain healthy weight gain and avoid chemicals and drugs that are not necessary.  Be watchful for   any type of infection.  Inform your health care provider if you have a known history of preterm labor. Document Released: 05/05/2003 Document Revised: 10/15/2012 Document Reviewed: 03/17/2012 [facility] Patient Information 2015 [place], [place]. This information is not intended to replace advice given to you by your health care provider. Make sure you discuss any questions you have with your health care provider.   Polyhydramnios When a woman becomes pregnant, a sac is formed around the fertilized egg (embryo)  and later the growing baby (fetus). This sac is called the amniotic sac. The amniotic sac is filled with fluid. It gets bigger as the pregnancy grows. When there is too much fluid in the sac, it is called polyhydramnios. All babies born with polyhydramnios should be looked at for congenital abnormalities. The amniotic fluid cushions and protects the baby. It also provides the baby with fluids and is crucial to normal development. Your baby breathes this fluid into its lungs and swallows it. This helps promote the healthy growth of the lungs and gastrointestinal tract. Amniotic fluid also helps the baby move around, helping with the normal development of muscle and bone.  CAUSES   Diabetes mellitus.  Downs Syndrome, fetal abnormalities of the intestinal tract and anencephaly (the fetus has no brain) can prevent the fetus from swallowing the amniotic fluid.  One twins passes (transfuses) their blood into the other twin (twin-twin transfusion syndrome).  Medical illness of the mother or heart.  Kidney disease.  Tumor (chorioangioma) of the placenta. SYMPTOMS   The uterus enlarges beyond the size it should be for that particular time of the pregnancy.  The mother may feel more pressure and discomfort than should be expected.  The mother may notice a quick and unexpected enlargement of her stomach. DIAGNOSIS   Your health care provider notices your uterus is beyond the size that is consistent with the time of the pregnancy when he or she measures you.  An ultrasound is then used (abdominally or vaginally) to see if there are twins or more, measure the growth of the baby, looks for birth defects and measures the amount of fluid in the amniotic sac.  Amniotic Fluid Index (AFI). AFI measures the amount of fluid in the amniotic sac in four different areas. If there is more than 24 centimeters, you have polyhydramnios. TREATMENT   Removing some fluid from the amniotic sac.  Take medications  that lower the fluids in your body.  Stop using salt or salty foods because it causes you to keep fluid in your body (retention).  If your health care provider thinks you have polyhydramnios, you will likely need more testing. You will be watched for the rest of your pregnancy. HOME CARE INSTRUCTIONS   Keep all your prenatal appointments. Follow your health care provider's recommendations.  Do not eat a lot of salt and salty foods.  If you have diabetes, keep in it control.  If you have heart or kidney disease, treat the disease as advised by your health care provider. SEEK MEDICAL CARE IF:   You think your uterus has grown too fast in a short period of time.  You feel a great amount of pressure in your lower belly (pelvis) and are more uncomfortable than expected. SEEK IMMEDIATE MEDICAL CARE IF:   You have a gush of fluid or are leaking fluid from your vagina.  You stop feeling the baby move.  You do not feel the baby kicking as much as usual.  You have a hard time keeping  your diabetes under control.  You are having problems with your heart or kidney disease. Document Released: 05/05/2002 Document Revised: 12/03/2012 Document Reviewed: 09/11/2012 [facility] Patient Information 2015 [place]. This information is not intended to replace advice given to you by your health care provider. Make sure you discuss any questions you have with your health care provider.

## 2014-09-15 NOTE — Progress Notes (Signed)
High Risk Pregnancy Diagnosis(es): polyhydramnios dx today G1P0 4227w4d Estimated Date of Delivery: 10/09/14 BP 120/68 mmHg  Pulse 76  Wt 148 lb (67.132 kg)  LMP 01/11/2014  Urinalysis: Negative HPI:  Doing well, no complaints BP, weight, and urine reviewed.  Reports good fm. Denies regular uc's, lof, vb, uti s/s.   Fundal Height:  32 Fetal Heart rate:  125 Edema:  1+ GBS collected SVE per request: able to reach posterior lip of outer os, but can't reach any further, unable to determine presentation  Mary Meyer has available slot, will work her in for s<d efw/afi/presentation u/s U/S: vtx, AFI 24.72=polyhydramnios, efw 47%, femur & humerus are shortened but all other long bones (radius/ulna, tib/fib) normal, BPP 6/8 (-2 for respirations)  Reviewed us results, ptl s/s, fkc All questions were answered Assessment: 7127w4d polyhydramnios dx today Medication(s) Plans:  n/a Treatment Plan:  2x/wk testing nst/sono, IOL @ 39wks Follow up in 2s for high-risk OB appt and nst GBS today

## 2014-09-16 LAB — GC/CHLAMYDIA PROBE AMP
CHLAMYDIA, DNA PROBE: NEGATIVE
NEISSERIA GONORRHOEAE BY PCR: NEGATIVE

## 2014-09-17 ENCOUNTER — Ambulatory Visit (INDEPENDENT_AMBULATORY_CARE_PROVIDER_SITE_OTHER): Payer: Medicaid Other | Admitting: Obstetrics & Gynecology

## 2014-09-17 ENCOUNTER — Encounter: Payer: Self-pay | Admitting: Obstetrics & Gynecology

## 2014-09-17 ENCOUNTER — Other Ambulatory Visit: Payer: Medicaid Other

## 2014-09-17 VITALS — BP 100/80 | HR 88 | Wt 148.0 lb

## 2014-09-17 DIAGNOSIS — O09893 Supervision of other high risk pregnancies, third trimester: Secondary | ICD-10-CM

## 2014-09-17 DIAGNOSIS — O403XX1 Polyhydramnios, third trimester, fetus 1: Secondary | ICD-10-CM

## 2014-09-17 DIAGNOSIS — Z1389 Encounter for screening for other disorder: Secondary | ICD-10-CM

## 2014-09-17 DIAGNOSIS — Z331 Pregnant state, incidental: Secondary | ICD-10-CM

## 2014-09-17 LAB — POCT URINALYSIS DIPSTICK
Glucose, UA: NEGATIVE
Ketones, UA: NEGATIVE
Leukocytes, UA: NEGATIVE
Nitrite, UA: NEGATIVE
PROTEIN UA: NEGATIVE
RBC UA: NEGATIVE

## 2014-09-17 NOTE — Progress Notes (Signed)
Fetal Surveillance Testing today:  Reactive NST   High Risk Pregnancy Diagnosis(es):   Idiopathic polyhydramnios, mild  G1P0 [redacted]w[redacted]d Estimated Date of Delivery: 10/09/14  Blood pressure 100/80, pulse 88, weight 148 lb (67.132 kg), last menstrual period 01/11/2014.  Urinalysis: Negative   HPI: The patient is being seen today for ongoing management of idiopathic polyhydramnios. Today she reports pelvic pressure patient contractions   BP weight and urine results all reviewed and noted. Patient reports good fetal movement, denies any bleeding and no rupture of membranes symptoms or regular contractions.  Fundal Height:  34 Fetal Heart rate:  130 Edema:  Trace  Patient is without complaints other than noted in her HPI. All questions were answered.  All lab and sonogram results have been reviewed. Comments: abnormal: sonogram AFI   Assessment:  1.  Pregnancy at [redacted]w[redacted]d,  Estimated Date of Delivery: 10/09/14 :                          2.  Idiopathic polyhydramnios                        3.    Medication(s) Plans:  No Changes  Treatment Plan:  Twice weekly surveillance  Follow up in Tuesday weeks for appointment for high risk OB care, nonstress test

## 2014-09-19 LAB — CULTURE, BETA STREP (GROUP B ONLY): Strep Gp B Culture: NEGATIVE

## 2014-09-21 ENCOUNTER — Other Ambulatory Visit: Payer: Medicaid Other

## 2014-09-21 ENCOUNTER — Ambulatory Visit (INDEPENDENT_AMBULATORY_CARE_PROVIDER_SITE_OTHER): Payer: Medicaid Other | Admitting: Obstetrics & Gynecology

## 2014-09-21 ENCOUNTER — Encounter: Payer: Self-pay | Admitting: Obstetrics & Gynecology

## 2014-09-21 VITALS — BP 120/80 | HR 72 | Wt 150.0 lb

## 2014-09-21 DIAGNOSIS — O09893 Supervision of other high risk pregnancies, third trimester: Secondary | ICD-10-CM | POA: Diagnosis not present

## 2014-09-21 DIAGNOSIS — Z331 Pregnant state, incidental: Secondary | ICD-10-CM

## 2014-09-21 DIAGNOSIS — Z1389 Encounter for screening for other disorder: Secondary | ICD-10-CM

## 2014-09-21 DIAGNOSIS — O403XX1 Polyhydramnios, third trimester, fetus 1: Secondary | ICD-10-CM

## 2014-09-21 LAB — POCT URINALYSIS DIPSTICK
Blood, UA: NEGATIVE
Glucose, UA: NEGATIVE
KETONES UA: NEGATIVE
Leukocytes, UA: NEGATIVE
Nitrite, UA: NEGATIVE
PROTEIN UA: NEGATIVE

## 2014-09-21 NOTE — Progress Notes (Signed)
Fetal Surveillance Testing today:  Reactive NST   High Risk Pregnancy Diagnosis(es):   Idiopathic polyhydramnios  G1P0 [redacted]w[redacted]d Estimated Date of Delivery: 10/09/14  Blood pressure 120/80, pulse 72, weight 150 lb (68.04 kg), last menstrual period 01/11/2014.  Urinalysis: Negative   HPI: The patient is being seen today for ongoing management of as above. Today she reports pelvic pressure back ache   BP weight and urine results all reviewed and noted. Patient reports good fetal movement, denies any bleeding and no rupture of membranes symptoms or regular contractions.  Fundal Height:  36 Fetal Heart rate:  130 Edema:  none  Patient is without complaints other than noted in her HPI. All questions were answered.  All lab and sonogram results have been reviewed. Comments: abnormal: idiopathic polyhydramnios   Assessment:  1.  Pregnancy at [redacted]w[redacted]d,  Estimated Date of Delivery: 10/09/14 :                          2.  Idiopathic poly, mild AFI 25 cm                        3.    Medication(s) Plans:  No changes  Treatment Plan:  twcie weekly surveillance  Follow up in friday weeks for appointment for high risk OB care, NST, sonogram next week

## 2014-09-21 NOTE — Addendum Note (Signed)
Addended by: Criss Alvine on: 09/21/2014 05:06 PM   Modules accepted: Orders

## 2014-09-22 LAB — US OB FOLLOW UP

## 2014-09-24 ENCOUNTER — Encounter: Payer: Self-pay | Admitting: Obstetrics & Gynecology

## 2014-09-24 ENCOUNTER — Ambulatory Visit (INDEPENDENT_AMBULATORY_CARE_PROVIDER_SITE_OTHER): Payer: Medicaid Other | Admitting: Obstetrics & Gynecology

## 2014-09-24 ENCOUNTER — Other Ambulatory Visit: Payer: Medicaid Other

## 2014-09-24 VITALS — BP 120/80 | HR 80 | Wt 149.4 lb

## 2014-09-24 DIAGNOSIS — O09893 Supervision of other high risk pregnancies, third trimester: Secondary | ICD-10-CM

## 2014-09-24 DIAGNOSIS — Z331 Pregnant state, incidental: Secondary | ICD-10-CM

## 2014-09-24 DIAGNOSIS — O403XX1 Polyhydramnios, third trimester, fetus 1: Secondary | ICD-10-CM | POA: Diagnosis not present

## 2014-09-24 DIAGNOSIS — Z1389 Encounter for screening for other disorder: Secondary | ICD-10-CM

## 2014-09-24 LAB — POCT URINALYSIS DIPSTICK
Blood, UA: NEGATIVE
GLUCOSE UA: NEGATIVE
Ketones, UA: NEGATIVE
Leukocytes, UA: NEGATIVE
Nitrite, UA: NEGATIVE
Protein, UA: NEGATIVE

## 2014-09-24 NOTE — Progress Notes (Signed)
Fetal Surveillance Testing today:  Reactive NST   High Risk Pregnancy Diagnosis(es):   Idiopathic polyhydramnios  G1P0 [redacted]w[redacted]d Estimated Date of Delivery: 10/09/14  Blood pressure 120/80, pulse 80, weight 149 lb 6.4 oz (67.767 kg), last menstrual period 01/11/2014.  Urinalysis: Negative   HPI: The patient is being seen today for ongoing management of idiopathic mild polyhydramnios. Today she reports back ache pelvic pressure   BP weight and urine results all reviewed and noted. Patient reports good fetal movement, denies any bleeding and no rupture of membranes symptoms or regular contractions.  Fundal Height:  36 Fetal Heart rate:  140 Edema:  none  Patient is without complaints other than noted in her HPI. All questions were answered.  All lab and sonogram results have been reviewed. Comments: abnormal: poly   Assessment:  1.  Pregnancy at [redacted]w[redacted]d,  Estimated Date of Delivery: 10/09/14 :                          2.  Idiopathic polyhydramnios, mild                        3.    Medication(s) Plans:  No changes  Treatment Plan:  Induction next Saturday, 8/6@0700  NST wednesday  Follow up in Wednesday weeks for appointment for high risk OB care, NST

## 2014-09-28 NOTE — Addendum Note (Signed)
Addended by: Criss Alvine on: 09/28/2014 11:39 AM   Modules accepted: Orders

## 2014-09-28 NOTE — Addendum Note (Signed)
Addended by: Criss Alvine on: 09/28/2014 11:40 AM   Modules accepted: Orders

## 2014-09-29 ENCOUNTER — Encounter: Payer: Self-pay | Admitting: Women's Health

## 2014-09-29 ENCOUNTER — Ambulatory Visit (INDEPENDENT_AMBULATORY_CARE_PROVIDER_SITE_OTHER): Payer: Medicaid Other | Admitting: Women's Health

## 2014-09-29 VITALS — BP 118/66 | HR 80 | Wt 149.0 lb

## 2014-09-29 DIAGNOSIS — O09893 Supervision of other high risk pregnancies, third trimester: Secondary | ICD-10-CM

## 2014-09-29 DIAGNOSIS — O403XX1 Polyhydramnios, third trimester, fetus 1: Secondary | ICD-10-CM | POA: Diagnosis not present

## 2014-09-29 DIAGNOSIS — Z1389 Encounter for screening for other disorder: Secondary | ICD-10-CM

## 2014-09-29 DIAGNOSIS — Z331 Pregnant state, incidental: Secondary | ICD-10-CM

## 2014-09-29 LAB — POCT URINALYSIS DIPSTICK
Glucose, UA: NEGATIVE
Ketones, UA: NEGATIVE
Leukocytes, UA: NEGATIVE
Nitrite, UA: NEGATIVE
PROTEIN UA: NEGATIVE
RBC UA: NEGATIVE

## 2014-09-29 NOTE — Patient Instructions (Signed)
Call the office ([phone]) or go to [facility] if:  You begin to have strong, frequent contractions  Your water breaks.  Sometimes it is a big gush of fluid, sometimes it is just a trickle that keeps getting your panties wet or running down your legs  You have vaginal bleeding.  It is normal to have a small amount of spotting if your cervix was checked.   You don't feel your baby moving like normal.  If you don't, get you something to eat and drink and lay down and focus on feeling your baby move.  You should feel at least 10 movements in 2 hours.  If you don't, you should call the office or go to [facility].    Braxton Hicks Contractions Contractions of the uterus can occur throughout pregnancy. Contractions are not always a sign that you are in labor.  WHAT ARE BRAXTON HICKS CONTRACTIONS?  Contractions that occur before labor are called Braxton Hicks contractions, or false labor. Toward the end of pregnancy (32-34 weeks), these contractions can develop more often and may become more forceful. This is not true labor because these contractions do not result in opening (dilatation) and thinning of the cervix. They are sometimes difficult to tell apart from true labor because these contractions can be forceful and people have different pain tolerances. You should not feel embarrassed if you go to the hospital with false labor. Sometimes, the only way to tell if you are in true labor is for your health care provider to look for changes in the cervix. If there are no prenatal problems or other health problems associated with the pregnancy, it is completely safe to be sent home with false labor and await the onset of true labor. HOW CAN YOU TELL THE DIFFERENCE BETWEEN TRUE AND FALSE LABOR? False Labor  The contractions of false labor are usually shorter and not as hard as those of true labor.   The contractions are usually irregular.   The contractions are often felt in the front of  the lower abdomen and in the groin.   The contractions may go away when you walk around or change positions while lying down.   The contractions get weaker and are shorter lasting as time goes on.   The contractions do not usually become progressively stronger, regular, and closer together as with true labor.  True Labor  Contractions in true labor last 30-70 seconds, become very regular, usually become more intense, and increase in frequency.   The contractions do not go away with walking.   The discomfort is usually felt in the top of the uterus and spreads to the lower abdomen and low back.   True labor can be determined by your health care provider with an exam. This will show that the cervix is dilating and getting thinner.  WHAT TO REMEMBER  Keep up with your usual exercises and follow other instructions given by your health care provider.   Take medicines as directed by your health care provider.   Keep your regular prenatal appointments.   Eat and drink lightly if you think you are going into labor.   If Braxton Hicks contractions are making you uncomfortable:   Change your position from lying down or resting to walking, or from walking to resting.   Sit and rest in a tub of warm water.   Drink 2-3 glasses of water. Dehydration may cause these contractions.   Do slow and deep breathing several times an hour.    WHEN SHOULD I SEEK IMMEDIATE MEDICAL CARE? Seek immediate medical care if:  Your contractions become stronger, more regular, and closer together.   You have fluid leaking or gushing from your vagina.   You have a fever.   You pass blood-tinged mucus.   You have vaginal bleeding.   You have continuous abdominal pain.   You have low back pain that you never had before.   You feel your baby's head pushing down and causing pelvic pressure.   Your baby is not moving as much as it used to.  Document Released: 02/12/2005 Document  Revised: 02/17/2013 Document Reviewed: 11/24/2012 [employer] Patient Information 2015 [employer]. This information is not intended to replace advice given to you by your health care provider. Make sure you discuss any questions you have with your health care provider.  

## 2014-09-29 NOTE — Progress Notes (Signed)
High Risk Pregnancy Diagnosis(es): Polyhydramnios G1P0 [redacted]w[redacted]d Estimated Date of Delivery: 10/09/14 BP 118/66 mmHg  Pulse 80  Wt 149 lb (67.586 kg)  LMP 01/11/2014  Urinalysis: Negative HPI:  Doing well, no complaints BP, weight, and urine reviewed.  Reports good fm. Denies regular uc's, lof, vb, uti s/s.   Fundal Height:  37 Fetal Heart rate:  130, reactive SNT Edema:  trace SVE per request: 1.5/70/-2, vtx  Reviewed labor s/s, fkc All questions were answered Assessment: [redacted]w[redacted]d Polyhydramnios Medication(s) Plans:  n/a Treatment Plan:  IOL 8/6 @ 0700 as previously scheduled Follow up in 2d for high-risk OB appt & bpp/afi u/s

## 2014-09-30 ENCOUNTER — Telehealth (HOSPITAL_COMMUNITY): Payer: Self-pay | Admitting: *Deleted

## 2014-09-30 NOTE — Telephone Encounter (Signed)
Preadmission screen  

## 2014-10-01 ENCOUNTER — Ambulatory Visit (INDEPENDENT_AMBULATORY_CARE_PROVIDER_SITE_OTHER): Payer: Medicaid Other

## 2014-10-01 ENCOUNTER — Encounter: Payer: Self-pay | Admitting: Obstetrics and Gynecology

## 2014-10-01 ENCOUNTER — Ambulatory Visit (INDEPENDENT_AMBULATORY_CARE_PROVIDER_SITE_OTHER): Payer: Medicaid Other | Admitting: Obstetrics and Gynecology

## 2014-10-01 VITALS — BP 120/80 | HR 80 | Wt 150.0 lb

## 2014-10-01 DIAGNOSIS — O403XX1 Polyhydramnios, third trimester, fetus 1: Secondary | ICD-10-CM

## 2014-10-01 DIAGNOSIS — O09893 Supervision of other high risk pregnancies, third trimester: Secondary | ICD-10-CM | POA: Diagnosis not present

## 2014-10-01 DIAGNOSIS — Z331 Pregnant state, incidental: Secondary | ICD-10-CM

## 2014-10-01 DIAGNOSIS — Z1389 Encounter for screening for other disorder: Secondary | ICD-10-CM

## 2014-10-01 LAB — POCT URINALYSIS DIPSTICK
Glucose, UA: NEGATIVE
Nitrite, UA: NEGATIVE
Protein, UA: NEGATIVE

## 2014-10-01 NOTE — Progress Notes (Signed)
Patient ID: Mary Meyer, female   DOB: April 22, 1990, 24 y.o.   MRN: [id number]  Fetal Surveillance Testing today:  U/S Fetal BPP W/O non-stress   High Risk Pregnancy Diagnosis(es):   Polyhydramnios - NOW RESOLVED  G1P0 [redacted]w[redacted]d Estimated Date of Delivery: 10/09/14  Blood pressure 120/80, pulse 80, weight 150 lb (68.04 kg), last menstrual period 01/11/2014.  Urinalysis: Positive for trace blood, trace ketones, trace leukocytes, otherwise negative   HPI: The patient is being seen today for ongoing management of polyhydra Today she reports . The last time her cervix was checked, it was 1.5 and 75%.    BP weight and urine results all reviewed and noted. Patient reports good fetal movement, denies any bleeding and no rupture of membranes symptoms or regular contractions.  Fundal Height:  36 cm Fetal Heart rate:  134 bpm Edema:  n/a   Patient is without complaints other than noted in her HPI. All questions were answered.  All lab and sonogram results have been reviewed. Comments: normal   Assessment:  1.  Pregnancy at [redacted]w[redacted]d,  Estimated Date of Delivery: 10/09/14   2. IOL tomorrow morning  Follow up in 4 weeks for postpartum check     This chart was SCRIBED for Christin Bach, MD by Ronney Lion, ED Scribe. This patient was seen in room 2, and the patient's care was started at 9:59 AM.  I personally performed the services described in this documentation, which was SCRIBED in my presence. The recorded information has been reviewed and considered accurate. It has been edited as necessary during review. Tilda Burrow, MD

## 2014-10-01 NOTE — Progress Notes (Signed)
[place] BPP 38+6wks,cephalic,ant pl gr 3,afi 19.7cm,fht 136bpm,normal ov's bilat,BPP 8/8

## 2014-10-02 ENCOUNTER — Encounter (HOSPITAL_COMMUNITY): Payer: Self-pay

## 2014-10-02 ENCOUNTER — Inpatient Hospital Stay (HOSPITAL_COMMUNITY)
Admission: RE | Admit: 2014-10-02 | Discharge: 2014-10-05 | DRG: 765 | Disposition: A | Discharge status: Home / Self Care | Payer: Medicaid Other | Source: Ambulatory Visit | Attending: Obstetrics & Gynecology | Admitting: Obstetrics & Gynecology

## 2014-10-02 ENCOUNTER — Inpatient Hospital Stay (HOSPITAL_COMMUNITY): Payer: Medicaid Other

## 2014-10-02 VITALS — BP 118/63 | HR 74 | Temp 98.1°F | Resp 18 | Ht 63.0 in | Wt 150.0 lb

## 2014-10-02 DIAGNOSIS — Z3A39 39 weeks gestation of pregnancy: Secondary | ICD-10-CM | POA: Diagnosis present

## 2014-10-02 DIAGNOSIS — Z87891 Personal history of nicotine dependence: Secondary | ICD-10-CM

## 2014-10-02 DIAGNOSIS — R000 Tachycardia, unspecified: Secondary | ICD-10-CM

## 2014-10-02 DIAGNOSIS — O409XX0 Polyhydramnios, unspecified trimester, not applicable or unspecified: Secondary | ICD-10-CM

## 2014-10-02 DIAGNOSIS — Z98891 History of uterine scar from previous surgery: Secondary | ICD-10-CM

## 2014-10-02 DIAGNOSIS — O403XX0 Polyhydramnios, third trimester, not applicable or unspecified: Secondary | ICD-10-CM

## 2014-10-02 DIAGNOSIS — O09893 Supervision of other high risk pregnancies, third trimester: Secondary | ICD-10-CM

## 2014-10-02 DIAGNOSIS — Z8759 Personal history of other complications of pregnancy, childbirth and the puerperium: Secondary | ICD-10-CM

## 2014-10-02 DIAGNOSIS — O09899 Supervision of other high risk pregnancies, unspecified trimester: Secondary | ICD-10-CM

## 2014-10-02 LAB — CBC
HCT: 35 % — ABNORMAL LOW (ref 36.0–46.0)
Hemoglobin: 12.2 g/dL (ref 12.0–15.0)
MCH: 31.8 pg (ref 26.0–34.0)
MCHC: 34.9 g/dL (ref 30.0–36.0)
MCV: 91.1 fL (ref 78.0–100.0)
Platelets: 144 10*3/uL — ABNORMAL LOW (ref 150–400)
RBC: 3.84 MIL/uL — ABNORMAL LOW (ref 3.87–5.11)
RDW: 13.7 % (ref 11.5–15.5)
WBC: 8.5 10*3/uL (ref 4.0–10.5)

## 2014-10-02 LAB — TYPE AND SCREEN
ABO/RH(D): O POS
ANTIBODY SCREEN: NEGATIVE

## 2014-10-02 LAB — ABO/RH: ABO/RH(D): O POS

## 2014-10-02 MED ORDER — OXYCODONE-ACETAMINOPHEN 5-325 MG PO TABS: 1.0000 | ORAL_TABLET | ORAL | Status: DC | PRN

## 2014-10-02 MED ORDER — TERBUTALINE SULFATE 1 MG/ML IJ SOLN: 0.2500 mg | Freq: Once | INTRAMUSCULAR | Status: AC | PRN

## 2014-10-02 MED ORDER — CITRIC ACID-SODIUM CITRATE 334-500 MG/5ML PO SOLN
30.0000 mL | ORAL | Status: DC | PRN
  Administered 2014-10-03: 30 mL via ORAL
  Filled 2014-10-02: qty 15

## 2014-10-02 MED ORDER — LACTATED RINGERS IV SOLN: 500.0000 mL | INTRAVENOUS | Status: DC | PRN

## 2014-10-02 MED ORDER — OXYCODONE-ACETAMINOPHEN 5-325 MG PO TABS: 2.0000 | ORAL_TABLET | ORAL | Status: DC | PRN

## 2014-10-02 MED ORDER — LACTATED RINGERS IV SOLN
INTRAVENOUS | Status: DC
  Administered 2014-10-02: 08:00:00 via INTRAVENOUS

## 2014-10-02 MED ORDER — MISOPROSTOL 25 MCG QUARTER TABLET
25.0000 ug | ORAL_TABLET | ORAL | Status: DC
Start: 2014-10-02 — End: 2014-10-03
  Administered 2014-10-02: 25 ug via VAGINAL
  Filled 2014-10-02: qty 1
  Filled 2014-10-02 (×2): qty 0.25
  Filled 2014-10-02: qty 1

## 2014-10-02 MED ORDER — FLEET ENEMA 7-19 GM/118ML RE ENEM: 1.0000 | ENEMA | RECTAL | Status: DC | PRN

## 2014-10-02 MED ORDER — OXYTOCIN 40 UNITS IN LACTATED RINGERS INFUSION - SIMPLE MED
1.0000 m[IU]/min | INTRAVENOUS | Status: DC
  Administered 2014-10-02: 2 m[IU]/min via INTRAVENOUS

## 2014-10-02 MED ORDER — ONDANSETRON HCL 4 MG/2ML IJ SOLN: 4.0000 mg | Freq: Four times a day (QID) | INTRAMUSCULAR | Status: DC | PRN

## 2014-10-02 MED ORDER — OXYTOCIN BOLUS FROM INFUSION
500.0000 mL | INTRAVENOUS | Status: DC
Start: 2014-10-02 — End: 2014-10-03

## 2014-10-02 MED ORDER — LIDOCAINE HCL (PF) 1 % IJ SOLN
30.0000 mL | INTRAMUSCULAR | Status: DC | PRN
  Filled 2014-10-02: qty 30

## 2014-10-02 MED ORDER — OXYTOCIN 40 UNITS IN LACTATED RINGERS INFUSION - SIMPLE MED
62.5000 mL/h | INTRAVENOUS | Status: DC
  Filled 2014-10-02: qty 1000

## 2014-10-02 MED ORDER — ACETAMINOPHEN 325 MG PO TABS: 650.0000 mg | ORAL_TABLET | ORAL | Status: DC | PRN

## 2014-10-02 MED ORDER — FENTANYL CITRATE (PF) 100 MCG/2ML IJ SOLN: 50.0000 ug | INTRAMUSCULAR | Status: DC | PRN

## 2014-10-02 NOTE — Progress Notes (Signed)
Dr.Allen AROM pt at 2350 clear fluid. Pt has dilated 8 cm 100, 0 station. Resident was at bedside by himself.

## 2014-10-02 NOTE — Progress Notes (Signed)
Labor Progress Note  S: Pt seen w/ RN at bedside; pt in pain w/ each contx, but does not want meds  O:  BP 130/76 mmHg  Pulse 100  Temp(Src) 98.7 F (37.1 C) (Oral)  Resp 18  Ht  (1.6 m)  Wt 68.04 kg (150 lb)  BMI 26.58 kg/m2  LMP 01/11/2014 FHR 130s; mod var, +acels, +early decels, contx q2-3 min CVE: Dilation: 6.5 Effacement (%): 90 Cervical Position: Posterior Station: -1 Presentation: Vertex Exam by::  (Dr. Freida Busman)   A&P: 24 y.o. G1P0 [redacted]w[redacted]d making slow cervical change #given slow change and IOL, will consider AROM #Pt wants to wait for family; will return to AROM in 30 min #pt counseled on risks of AROM including infx and cord prolapse #expecting normal progression to vag delivery  Lowanda Foster, MD 10:43 PM

## 2014-10-02 NOTE — H&P (Signed)
Mary Meyer is a 24 y.o. female presenting for IOL 2/2 polyhydramnios.  Maternal Medical History:  Fetal activity: Perceived fetal activity is normal.    Prenatal complications: No bleeding.     OB History    Gravida Para Term Preterm AB TAB SAB Ectopic Multiple Living   1              Past Medical History  Diagnosis Date  . Tachycardia    Past Surgical History  Procedure Laterality Date  . Cardiac catheterization  2009    states due to tacycardia, had alblation  . Cardiac catheterization  2009   Family History: family history is not on file. Social History:  reports that she has quit smoking. Her smoking use included Cigarettes. She has never used smokeless tobacco. She reports that she does not drink alcohol or use illicit drugs.   Prenatal Transfer Tool  Maternal Diabetes: No Genetic Screening: Normal Maternal Ultrasounds/Referrals: Abnormal:  Findings:   Other: Polyhydramnios Fetal Ultrasounds or other Referrals:  None Maternal Substance Abuse:  No Significant Maternal Medications:  None Significant Maternal Lab Results:  None Other Comments:  None  Review of Systems  Constitutional: Negative.   HENT: Negative.   Eyes: Negative.   Respiratory: Negative.   Cardiovascular: Negative.     Dilation: 1.5 Effacement (%): 70 Station: -2 Exam by:: katherine g jones RN  Blood pressure 138/89, pulse 95, temperature 98 F (36.7 C), temperature source Oral, resp. rate 18, height  (1.6 m), weight 150 lb (68.04 kg), last menstrual period 01/11/2014. Maternal Exam:  Abdomen: Patient reports no abdominal tenderness.   Physical Exam  Constitutional: She appears well-developed and well-nourished.  HENT:  Head: Normocephalic.  Eyes: Conjunctivae are normal.  Neck: Normal range of motion.  Cardiovascular: Normal rate and regular rhythm.   Respiratory: Effort normal and breath sounds normal.  GI: Soft.  Neurological: She is alert.  Skin: Skin is warm.   Psychiatric: She has a normal mood and affect.    Prenatal labs: ABO, Rh: --/--/O POS (08/06 0815) Antibody: NEG (08/06 0815) Rubella: Immune (01/07 0000) RPR: Non Reactive (05/18 0917)  HBsAg: Negative (01/07 0000)  HIV: Non-reactive (01/07 0000)  GBS: Negative (07/20 0000)   Assessment/Plan: 24 yo G1 @ 39+0 by /13 presenting for IOL 2/2 polyhydramnios  # IOL 2/2 polyhydramnios - polyhydramnios confirmed today - cervix 1.5 cm dilated per rn exam, first dose cyctotec given - frequent vertex checks - aware increased risk PPH  # GBS: negative  # Feeding: breast  # Contraception: depo  # Circ: no      Noah B Wouk 10/02/2014, 11:43 AM

## 2014-10-02 NOTE — Progress Notes (Signed)
LABOR PROGRESS NOTE  Mary Meyer is a 24 y.o. G1P0 at [redacted]w[redacted]d  admitted for IOL 2/2 polyhydramnios.  Subjective: Very mild contractions, no bleeding or LOF.  Objective: BP 121/70 mmHg  Pulse 86  Temp(Src) 98 F (36.7 C) (Oral)  Resp 18  Ht  (1.6 m)  Wt 150 lb (68.04 kg)  BMI 26.58 kg/m2  LMP 01/11/2014 or  Filed Vitals:   10/02/14 0733 10/02/14 0809 10/02/14 1112 10/02/14 1310  BP: 130/79  138/89 121/70  Pulse: 102  95 86  Temp:  98.3 F (36.8 C) 98 F (36.7 C)   TempSrc:  Axillary Oral   Resp: Height:  (1.6 m)     Weight: 150 lb (68.04 kg)        Dilation: 1.5 Effacement (%): 70 Station: -2 Presentation: Vertex Exam by:: katherine g jones RN  Labs: Lab Results  Component Value Date   WBC 8.5 10/02/2014   HGB 12.2 10/02/2014   HCT 35.0* 10/02/2014   MCV 91.1 10/02/2014   PLT 144* 10/02/2014    Patient Active Problem List   Diagnosis Date Noted  . Polyhydramnios 10/02/2014  . Polyhydramnios in third trimester, antepartum 09/15/2014  . Supervision of other high-risk pregnancy 05/11/2014  . Tachycardia   . Abnormal Pap smear of cervix 05/07/2014    Assessment / Plan: 24 y.o. G1P0 at [redacted]w[redacted]d here for IOL 2/2 polyhydramnios.  Labor: s/p cytotec 25 mcg vaginally @ 11:00. Currently contracting too frequently to re-dose. Foley bulb placed 15:30. Will re-dose cytotec when able. Fetal Wellbeing:  Category 1 Pain Control:  Non-pharmacologic for now Anticipated MOD:  vaginal  Silvano Bilis, MD 10/02/2014, 3:30 PM

## 2014-10-03 ENCOUNTER — Encounter (HOSPITAL_COMMUNITY)
Admission: RE | Disposition: A | Discharge status: Home / Self Care | Payer: Self-pay | Source: Ambulatory Visit | Attending: Obstetrics & Gynecology

## 2014-10-03 ENCOUNTER — Encounter (HOSPITAL_COMMUNITY): Payer: Self-pay | Admitting: Anesthesiology

## 2014-10-03 ENCOUNTER — Inpatient Hospital Stay (HOSPITAL_COMMUNITY): Payer: Medicaid Other

## 2014-10-03 ENCOUNTER — Inpatient Hospital Stay (HOSPITAL_COMMUNITY): Payer: Medicaid Other | Admitting: Anesthesiology

## 2014-10-03 ENCOUNTER — Encounter (HOSPITAL_COMMUNITY): Payer: Self-pay

## 2014-10-03 DIAGNOSIS — Z3A39 39 weeks gestation of pregnancy: Secondary | ICD-10-CM

## 2014-10-03 DIAGNOSIS — O403XX0 Polyhydramnios, third trimester, not applicable or unspecified: Secondary | ICD-10-CM

## 2014-10-03 DIAGNOSIS — Z87891 Personal history of nicotine dependence: Secondary | ICD-10-CM

## 2014-10-03 LAB — CBC
HCT: 26.5 % — ABNORMAL LOW (ref 36.0–46.0)
HCT: 24.8 % — ABNORMAL LOW (ref 36.0–46.0)
HEMOGLOBIN: 9 g/dL — AB (ref 12.0–15.0)
Hemoglobin: 8.6 g/dL — ABNORMAL LOW (ref 12.0–15.0)
MCH: 31.3 pg (ref 26.0–34.0)
MCH: 31.6 pg (ref 26.0–34.0)
MCHC: 34.3 g/dL (ref 30.0–36.0)
MCHC: 34.7 g/dL (ref 30.0–36.0)
MCV: 91.1 fL (ref 78.0–100.0)
MCV: 91.2 fL (ref 78.0–100.0)
PLATELETS: 122 10*3/uL — AB (ref 150–400)
Platelets: 130 10*3/uL — ABNORMAL LOW (ref 150–400)
RBC: 2.91 MIL/uL — ABNORMAL LOW (ref 3.87–5.11)
RBC: 2.72 MIL/uL — AB (ref 3.87–5.11)
RDW: 13.7 % (ref 11.5–15.5)
RDW: 13.7 % (ref 11.5–15.5)
WBC: 18.6 10*3/uL — ABNORMAL HIGH (ref 4.0–10.5)
WBC: 16.3 10*3/uL — ABNORMAL HIGH (ref 4.0–10.5)

## 2014-10-03 LAB — RPR: RPR Ser Ql: NONREACTIVE

## 2014-10-03 SURGERY — Surgical Case
Anesthesia: General

## 2014-10-03 MED ORDER — FENTANYL CITRATE (PF) 250 MCG/5ML IJ SOLN
INTRAMUSCULAR | Status: AC
  Filled 2014-10-03: qty 25

## 2014-10-03 MED ORDER — OXYTOCIN 10 UNIT/ML IJ SOLN
40.0000 [IU] | INTRAVENOUS | Status: DC | PRN
  Administered 2014-10-03: 40 [IU] via INTRAVENOUS

## 2014-10-03 MED ORDER — DIBUCAINE 1 % RE OINT
1.0000 "application " | TOPICAL_OINTMENT | RECTAL | Status: DC | PRN
  Administered 2014-10-04: 1 via RECTAL
  Filled 2014-10-03: qty 28

## 2014-10-03 MED ORDER — DIPHENHYDRAMINE HCL 25 MG PO CAPS: 25.0000 mg | ORAL_CAPSULE | Freq: Four times a day (QID) | ORAL | Status: DC | PRN

## 2014-10-03 MED ORDER — HYDROMORPHONE HCL 1 MG/ML IJ SOLN
INTRAMUSCULAR | Status: AC
  Filled 2014-10-03: qty 1

## 2014-10-03 MED ORDER — PROPOFOL 10 MG/ML IV BOLUS
INTRAVENOUS | Status: DC | PRN
  Administered 2014-10-03: 150 mg via INTRAVENOUS

## 2014-10-03 MED ORDER — ONDANSETRON HCL 4 MG/2ML IJ SOLN: 4.0000 mg | Freq: Once | INTRAMUSCULAR | Status: DC | PRN

## 2014-10-03 MED ORDER — OXYCODONE-ACETAMINOPHEN 5-325 MG PO TABS
1.0000 | ORAL_TABLET | ORAL | Status: DC | PRN
  Administered 2014-10-05: 1 via ORAL
  Filled 2014-10-03: qty 1

## 2014-10-03 MED ORDER — HYDROMORPHONE HCL 1 MG/ML IJ SOLN
INTRAMUSCULAR | Status: DC | PRN
  Administered 2014-10-03 (×2): 0.5 mg via INTRAVENOUS

## 2014-10-03 MED ORDER — LIDOCAINE HCL (CARDIAC) 20 MG/ML IV SOLN
INTRAVENOUS | Status: DC | PRN
  Administered 2014-10-03: 100 mg via INTRAVENOUS

## 2014-10-03 MED ORDER — SUCCINYLCHOLINE CHLORIDE 20 MG/ML IJ SOLN
INTRAMUSCULAR | Status: DC | PRN
  Administered 2014-10-03: 50 mg via INTRAVENOUS

## 2014-10-03 MED ORDER — PROPOFOL 10 MG/ML IV BOLUS
INTRAVENOUS | Status: AC
  Filled 2014-10-03: qty 20

## 2014-10-03 MED ORDER — OXYTOCIN 40 UNITS IN LACTATED RINGERS INFUSION - SIMPLE MED: 62.5000 mL/h | INTRAVENOUS | Status: AC

## 2014-10-03 MED ORDER — SIMETHICONE 80 MG PO CHEW
80.0000 mg | CHEWABLE_TABLET | Freq: Three times a day (TID) | ORAL | Status: DC
  Administered 2014-10-03 – 2014-10-05 (×6): 80 mg via ORAL
  Filled 2014-10-03 (×7): qty 1

## 2014-10-03 MED ORDER — SCOPOLAMINE 1 MG/3DAYS TD PT72
MEDICATED_PATCH | TRANSDERMAL | Status: DC | PRN
  Administered 2014-10-03: 1 mg via TRANSDERMAL

## 2014-10-03 MED ORDER — CEFAZOLIN SODIUM-DEXTROSE 2-3 GM-% IV SOLR
INTRAVENOUS | Status: DC | PRN
  Administered 2014-10-03: 2 g via INTRAVENOUS

## 2014-10-03 MED ORDER — NALOXONE HCL 0.4 MG/ML IJ SOLN: 0.4000 mg | INTRAMUSCULAR | Status: DC | PRN

## 2014-10-03 MED ORDER — ACETAMINOPHEN 325 MG PO TABS: 650.0000 mg | ORAL_TABLET | ORAL | Status: DC | PRN

## 2014-10-03 MED ORDER — SENNOSIDES-DOCUSATE SODIUM 8.6-50 MG PO TABS
2.0000 | ORAL_TABLET | ORAL | Status: DC
  Administered 2014-10-03 – 2014-10-05 (×2): 2 via ORAL
  Filled 2014-10-03 (×3): qty 2

## 2014-10-03 MED ORDER — SCOPOLAMINE 1 MG/3DAYS TD PT72
MEDICATED_PATCH | TRANSDERMAL | Status: AC
  Filled 2014-10-03: qty 1

## 2014-10-03 MED ORDER — SIMETHICONE 80 MG PO CHEW
80.0000 mg | CHEWABLE_TABLET | ORAL | Status: DC | PRN
  Administered 2014-10-03: 80 mg via ORAL

## 2014-10-03 MED ORDER — PROPOFOL 10 MG/ML IV BOLUS
INTRAVENOUS | Status: AC
  Filled 2014-10-03: qty 40

## 2014-10-03 MED ORDER — SIMETHICONE 80 MG PO CHEW
80.0000 mg | CHEWABLE_TABLET | ORAL | Status: DC
  Administered 2014-10-03 – 2014-10-05 (×2): 80 mg via ORAL
  Filled 2014-10-03 (×2): qty 1

## 2014-10-03 MED ORDER — LACTATED RINGERS IV SOLN
INTRAVENOUS | Status: DC
Start: 2014-10-03 — End: 2014-10-05
  Administered 2014-10-03: 04:00:00 via INTRAVENOUS

## 2014-10-03 MED ORDER — CEFAZOLIN SODIUM-DEXTROSE 2-3 GM-% IV SOLR
INTRAVENOUS | Status: AC
  Filled 2014-10-03: qty 50

## 2014-10-03 MED ORDER — MENTHOL 3 MG MT LOZG: 1.0000 | LOZENGE | OROMUCOSAL | Status: DC | PRN

## 2014-10-03 MED ORDER — MIDAZOLAM HCL 2 MG/2ML IJ SOLN
INTRAMUSCULAR | Status: AC
  Filled 2014-10-03: qty 4

## 2014-10-03 MED ORDER — HYDROMORPHONE HCL 1 MG/ML IJ SOLN: 0.2500 mg | INTRAMUSCULAR | Status: DC | PRN

## 2014-10-03 MED ORDER — ZOLPIDEM TARTRATE 5 MG PO TABS: 5.0000 mg | ORAL_TABLET | Freq: Every evening | ORAL | Status: DC | PRN

## 2014-10-03 MED ORDER — MORPHINE SULFATE 10 MG/ML IJ SOLN
INTRAMUSCULAR | Status: DC | PRN
  Administered 2014-10-03: 1 mg via INTRAVENOUS
  Administered 2014-10-03: 2 mg via INTRAVENOUS
  Administered 2014-10-03 (×2): 1 mg via INTRAVENOUS

## 2014-10-03 MED ORDER — WITCH HAZEL-GLYCERIN EX PADS
1.0000 "application " | MEDICATED_PAD | CUTANEOUS | Status: DC | PRN
  Administered 2014-10-04: 1 via TOPICAL

## 2014-10-03 MED ORDER — LANOLIN HYDROUS EX OINT: 1.0000 "application " | TOPICAL_OINTMENT | CUTANEOUS | Status: DC | PRN

## 2014-10-03 MED ORDER — PRENATAL MULTIVITAMIN CH
1.0000 | ORAL_TABLET | Freq: Every day | ORAL | Status: DC
  Administered 2014-10-03 – 2014-10-04 (×2): 1 via ORAL
  Filled 2014-10-03 (×2): qty 1

## 2014-10-03 MED ORDER — HYDROMORPHONE 0.3 MG/ML IV SOLN
INTRAVENOUS | Status: DC
  Administered 2014-10-03: 0.2 mg via INTRAVENOUS
  Administered 2014-10-03: 05:00:00 via INTRAVENOUS
  Filled 2014-10-03: qty 25

## 2014-10-03 MED ORDER — MORPHINE SULFATE 0.5 MG/ML IJ SOLN
INTRAMUSCULAR | Status: AC
  Filled 2014-10-03: qty 100

## 2014-10-03 MED ORDER — PHENYLEPHRINE 40 MCG/ML (10ML) SYRINGE FOR IV PUSH (FOR BLOOD PRESSURE SUPPORT)
PREFILLED_SYRINGE | INTRAVENOUS | Status: AC
  Filled 2014-10-03: qty 10

## 2014-10-03 MED ORDER — SODIUM CHLORIDE 0.9 % IJ SOLN: 9.0000 mL | INTRAMUSCULAR | Status: DC | PRN

## 2014-10-03 MED ORDER — FENTANYL CITRATE (PF) 100 MCG/2ML IJ SOLN: 25.0000 ug | INTRAMUSCULAR | Status: DC | PRN

## 2014-10-03 MED ORDER — SUCCINYLCHOLINE CHLORIDE 20 MG/ML IJ SOLN
INTRAMUSCULAR | Status: AC
  Filled 2014-10-03: qty 1

## 2014-10-03 MED ORDER — ONDANSETRON HCL 4 MG/2ML IJ SOLN
INTRAMUSCULAR | Status: AC
  Filled 2014-10-03: qty 2

## 2014-10-03 MED ORDER — ONDANSETRON HCL 4 MG/2ML IJ SOLN
INTRAMUSCULAR | Status: DC | PRN
  Administered 2014-10-03: 4 mg via INTRAVENOUS

## 2014-10-03 MED ORDER — LACTATED RINGERS IV SOLN
INTRAVENOUS | Status: DC | PRN
  Administered 2014-10-03: 01:00:00 via INTRAVENOUS

## 2014-10-03 MED ORDER — OXYCODONE-ACETAMINOPHEN 5-325 MG PO TABS: 2.0000 | ORAL_TABLET | ORAL | Status: DC | PRN

## 2014-10-03 MED ORDER — FENTANYL CITRATE (PF) 100 MCG/2ML IJ SOLN
INTRAMUSCULAR | Status: DC | PRN
  Administered 2014-10-03 (×3): 50 ug via INTRAVENOUS
  Administered 2014-10-03: 250 ug via INTRAVENOUS
  Administered 2014-10-03 (×2): 50 ug via INTRAVENOUS

## 2014-10-03 MED ORDER — DIPHENHYDRAMINE HCL 50 MG/ML IJ SOLN: 12.5000 mg | Freq: Four times a day (QID) | INTRAMUSCULAR | Status: DC | PRN

## 2014-10-03 MED ORDER — ONDANSETRON HCL 4 MG/2ML IJ SOLN: 4.0000 mg | Freq: Four times a day (QID) | INTRAMUSCULAR | Status: DC | PRN

## 2014-10-03 MED ORDER — PHENYLEPHRINE HCL 10 MG/ML IJ SOLN
INTRAMUSCULAR | Status: DC | PRN
  Administered 2014-10-03: 80 ug via INTRAVENOUS

## 2014-10-03 MED ORDER — DIPHENHYDRAMINE HCL 12.5 MG/5ML PO ELIX
12.5000 mg | ORAL_SOLUTION | Freq: Four times a day (QID) | ORAL | Status: DC | PRN
  Filled 2014-10-03: qty 5

## 2014-10-03 MED ORDER — IBUPROFEN 600 MG PO TABS
600.0000 mg | ORAL_TABLET | Freq: Four times a day (QID) | ORAL | Status: DC
  Administered 2014-10-03 – 2014-10-05 (×9): 600 mg via ORAL
  Filled 2014-10-03 (×9): qty 1

## 2014-10-03 MED ORDER — TETANUS-DIPHTH-ACELL PERTUSSIS 5-2.5-18.5 LF-MCG/0.5 IM SUSP: 0.5000 mL | Freq: Once | INTRAMUSCULAR | Status: DC

## 2014-10-03 MED ORDER — MIDAZOLAM HCL 2 MG/2ML IJ SOLN
INTRAMUSCULAR | Status: DC | PRN
  Administered 2014-10-03: 2 mg via INTRAVENOUS

## 2014-10-03 SURGICAL SUPPLY — 28 items
BARRIER ADHS 3X4 INTERCEED (GAUZE/BANDAGES/DRESSINGS) IMPLANT
CLAMP CORD UMBIL (MISCELLANEOUS) IMPLANT
CLOTH BEACON ORANGE TIMEOUT ST (SAFETY) ×2 IMPLANT
DRAPE SHEET LG 3/4 BI-LAMINATE (DRAPES) IMPLANT
DRSG OPSITE POSTOP 4X10 (GAUZE/BANDAGES/DRESSINGS) ×2 IMPLANT
DURAPREP 26ML APPLICATOR (WOUND CARE) ×2 IMPLANT
ELECT REM PT RETURN 9FT ADLT (ELECTROSURGICAL) ×2
ELECTRODE REM PT RTRN 9FT ADLT (ELECTROSURGICAL) ×1 IMPLANT
EXTRACTOR VACUUM KIWI (MISCELLANEOUS) IMPLANT
GLOVE BIO SURGEON STRL SZ 6.5 (GLOVE) ×2 IMPLANT
GLOVE BIOGEL PI IND STRL 7.0 (GLOVE) ×1 IMPLANT
GLOVE BIOGEL PI INDICATOR 7.0 (GLOVE) ×1
GOWN STRL REUS W/TWL LRG LVL3 (GOWN DISPOSABLE) ×4 IMPLANT
KIT ABG SYR 3ML LUER SLIP (SYRINGE) IMPLANT
NEEDLE HYPO 22GX1.5 SAFETY (NEEDLE) IMPLANT
NEEDLE HYPO 25X5/8 SAFETYGLIDE (NEEDLE) IMPLANT
NS IRRIG 1000ML POUR BTL (IV SOLUTION) ×2 IMPLANT
PACK C SECTION WH (CUSTOM PROCEDURE TRAY) ×2 IMPLANT
PAD OB MATERNITY 4.3X12.25 (PERSONAL CARE ITEMS) ×2 IMPLANT
RETRACTOR WND ALEXIS 25 LRG (MISCELLANEOUS) IMPLANT
RTRCTR WOUND ALEXIS 25CM LRG (MISCELLANEOUS)
SUT VIC AB 0 CT1 36 (SUTURE) ×8 IMPLANT
SUT VIC AB 2-0 CT1 27 (SUTURE) ×1
SUT VIC AB 2-0 CT1 TAPERPNT 27 (SUTURE) ×1 IMPLANT
SUT VIC AB 4-0 PS2 27 (SUTURE) ×4 IMPLANT
SYR CONTROL 10ML LL (SYRINGE) IMPLANT
TOWEL OR 17X24 6PK STRL BLUE (TOWEL DISPOSABLE) ×2 IMPLANT
TRAY FOLEY CATH SILVER 14FR (SET/KITS/TRAYS/PACK) IMPLANT

## 2014-10-03 NOTE — Progress Notes (Signed)
At 0050 pt called for pain medicine she was 8.5 cm dilated went into 164 to ask Dr. Freida Busman as pt was 8.5. Whiles in room 164 Mavis screamed for me that there a cord prolapse . I ran into the room with Edgardo Roys and Dr Freida Busman. I called Dr. Debroah Loop , he was in the room at 0055. Code C-Section was called. Arrived in OR at 0100.

## 2014-10-03 NOTE — Transfer of Care (Signed)
Immediate Anesthesia Transfer of Care Note  Patient: Mary Meyer  Procedure(s) Performed: Procedure(s): CESAREAN SECTION (N/A)  Patient Location: PACU  Anesthesia Type:General  Level of Consciousness: awake, alert  and oriented  Airway & Oxygen Therapy: Patient Spontanous Breathing and Patient connected to face mask oxygen  Post-op Assessment: Report given to RN and Post -op Vital signs reviewed and stable  Post vital signs: Reviewed and stable  Last Vitals:  Filed Vitals:   10/03/14 0030  BP: 134/82  Pulse: 98  Temp:   Resp: 18    Complications: No apparent anesthesia complications

## 2014-10-03 NOTE — Anesthesia Postprocedure Evaluation (Signed)
Anesthesia Post Note  Patient: Mary Meyer  Procedure(s) Performed: Procedure(s) (LRB): CESAREAN SECTION (N/A)  Anesthesia type: General  Patient location: Mother/Baby  Post pain: Pain level controlled  Post assessment: Post-op Vital signs reviewed  Last Vitals:  Filed Vitals:   10/03/14 0700  BP: 114/70  Pulse: 102  Temp: 36.8 C  Resp: 17    Post vital signs: Reviewed  Level of consciousness: sedated  Complications: No apparent anesthesia complications

## 2014-10-03 NOTE — Anesthesia Procedure Notes (Signed)
Procedure Name: Intubation Performed by: Miciah Covelli M Pre-anesthesia Checklist: Patient identified, Patient being monitored, Timeout performed, Emergency Drugs available and Suction available Patient Re-evaluated:Patient Re-evaluated prior to inductionOxygen Delivery Method: Circle system utilized Preoxygenation: Pre-oxygenation with 100% oxygen Intubation Type: IV induction, Rapid sequence and Cricoid Pressure applied Laryngoscope Size: Mac and 3 Grade View: Grade I Tube type: Oral Tube size: 7.0 mm Number of attempts: 1 Airway Equipment and Method: Stylet Placement Confirmation: ETT inserted through vocal cords under direct vision,  positive ETCO2 and breath sounds checked- equal and bilateral Secured at: 21 cm Tube secured with: Tape Dental Injury: Teeth and Oropharynx as per pre-operative assessment

## 2014-10-03 NOTE — Progress Notes (Signed)
Significant event  At approximately 23:45 Dr. Freida Busman performed gradual amniotomy. His hand remained at the fetal vertex through the next contraction and confirmed no cord had prolapsed. Approximately 30 minutes later the patient called out stating that she felt something happen. Fetal heart rate dropped below 100 bpm. RN exam discovered cord prolapse and I was called to the room. Patient was stated to be 10 cm dilated. I quickly switched positions with the RN to ascertain whether fetal station was adequate to proceed with expeditious vaginal delivery. It was not and so we proceeded directly to STAT c/s, I with my hand elevating the fetal vertex.

## 2014-10-03 NOTE — Lactation Note (Signed)
This note was copied from the chart of Mary Angelis Mello.  Lactation Consultation Note Initial visit at 16 hours.  Mom is recovering from c/s and not able to move around in bed well and continues to have iv line making latch hard for mom.  When I entered room FOB was holding blanket up to shield breast from visitors.   LC asked visitors to step behind curtain and encouraged mom to not use blanket when latching due to learning process.  Mom is anxious at this time, LC encouraged and tried to relax mom.  Baby latched well after a few attempts of mom allowing baby to suck on tip of nipple.  Baby in modified football hold.  Baby maintained strong sucking pattern for over 15 minutes with swallows noted.  Mom has easily expressed colostrum.  [facility] LC resources given and discussed.  Encouraged to feed with early cues on demand.  Early newborn behavior discussed.  Mom is very drowsy visitors at bedside know to watch mom during feeding.  Mom to call for assist as needed.    Patient Name: Mary Meyer ZOXWR'U Date: 10/03/2014 Reason for consult: Initial assessment   Maternal Data Has patient been taught Hand Expression?: Yes Does the patient have breastfeeding experience prior to this delivery?: No  Feeding Feeding Type: Breast Fed Length of feed:  (observed 15 minutes)  LATCH Score/Interventions Latch: Grasps breast easily, tongue down, lips flanged, rhythmical sucking. Intervention(s): Skin to skin;Teach feeding cues;Waking techniques  Audible Swallowing: Spontaneous and intermittent  Type of Nipple: Everted at rest and after stimulation  Comfort (Breast/Nipple): Soft / non-tender     Hold (Positioning): Assistance needed to correctly position infant at breast and maintain latch. Intervention(s): Breastfeeding basics reviewed;Support Pillows;Position options;Skin to skin  LATCH Score: 9  Lactation Tools Discussed/Used     Consult Status Consult Status: Follow-up Date:  10/04/14 Follow-up type: In-patient    Shoptaw, Arvella Merles 10/03/2014, 5:46 PM

## 2014-10-03 NOTE — Op Note (Signed)
Cesarean Section Procedure Note, emergent for cord prolapse  Indications: prolapsed umbilical cord  Pre-operative Diagnosis: 39 week 1 day pregnancy.  Post-operative Diagnosis: same  Surgeon: Scheryl Darter   Assistants: Dr. Shonna Chock  Anesthesia: General endotracheal anesthesia  ASA Class: 1   Procedure Details   The patient was seen in the Holding Room. The risks, benefits, complications, treatment options, and expected outcomes were discussed with the patient.  The patient concurred with the proposed plan, giving informed consent.  The site of surgery properly noted/marked. The patient was taken to Operating Room # 9, identified as DAI APEL and the procedure verified as C-Section Delivery. A Time Out was held and the above information confirmed. She was in labor at 39 weeks and prolapsed umbilical cord was diagnosed when she began pushing. The procedure and the risk of anesthesia, bleeding,transfusion, infection, postoperative pain and bowel and bladder injury were discussed and her questions were answered. Dr. Ashok Pall continued to elevate the fetal head until the fetus was delivered. Time out was performed. After induction of anesthesia, the patient was draped and prepped in the usual sterile manner. A Pfannenstiel incision was made and carried down through the subcutaneous tissue to the fascia. Fascial incision was made and extended transversely. The fascia was separated from the underlying rectus tissue superiorly and inferiorly. The peritoneum was identified and entered. Peritoneal incision was extended longitudinally.  A low transverse uterine incision was made. Delivered from cephalic presentation was a 3035 gram Female with Apgar scores of 8 at one minute and 9 at five minutes. After the umbilical cord was clamped and cut cord blood was obtained for evaluation. The placenta was removed intact and appeared normal. The uterine outline, tubes and ovaries appeared normal. The uterine  incision was closed with running locked sutures of 0 Vicryl and an imbricating layer followed. Hemostasis was observed. The peritoneum was closed with 2-0 Vicryl. The fascia was then reapproximated with running sutures of 0 Vicryl and  . The skin was reapproximated with 4-0 Vicryl.  Instrument, sponge, and needle counts were correct prior the abdominal closure and at the conclusion of the case.An X ray was performed to confirm.   Findings: Liveborn female  Estimated Blood Loss:  1000 ml         Drains: Foley catheter placed at the end of the procedure         Total IV Fluids:  2000 ml         Specimens: none                    Complications:  None; patient tolerated the procedure well.         Disposition: PACU - hemodynamically stable.         Condition: stable  Attending Attestation: I was present and scrubbed for the entire procedure.   Adam Phenix, MD 10/03/2014 2:18 AM

## 2014-10-03 NOTE — Anesthesia Preprocedure Evaluation (Addendum)
Anesthesia Evaluation  Patient identified by MRN, date of birth, ID band Patient awake    Reviewed: Allergy & Precautions, NPO status , Patient's Chart, lab work & pertinent test resultsPreop documentation limited or incomplete due to emergent nature of procedure.  History of Anesthesia Complications Negative for: history of anesthetic complications  Airway Mallampati: II  TM Distance: >3 FB Neck ROM: Full    Dental no notable dental hx. (+) Dental Advisory Given   Pulmonary neg pulmonary ROS, former smoker,  breath sounds clear to auscultation  Pulmonary exam normal       Cardiovascular negative cardio ROS Normal cardiovascular examRhythm:Regular Rate:Normal     Neuro/Psych negative neurological ROS  negative psych ROS   GI/Hepatic negative GI ROS, Neg liver ROS,   Endo/Other  negative endocrine ROS  Renal/GU negative Renal ROS  negative genitourinary   Musculoskeletal negative musculoskeletal ROS (+)   Abdominal   Peds negative pediatric ROS (+)  Hematology negative hematology ROS (+)   Anesthesia Other Findings   Reproductive/Obstetrics (+) Pregnancy                            Anesthesia Physical Anesthesia Plan  ASA: II and emergent  Anesthesia Plan: General   Post-op Pain Management:    Induction: Intravenous  Airway Management Planned: Oral ETT  Additional Equipment:   Intra-op Plan:   Post-operative Plan: Extubation in OR  Informed Consent: I have reviewed the patients History and Physical, chart, labs and discussed the procedure including the risks, benefits and alternatives for the proposed anesthesia with the patient or authorized representative who has indicated his/her understanding and acceptance.   Dental advisory given  Plan Discussed with: CRNA  Anesthesia Plan Comments:         Anesthesia Quick Evaluation

## 2014-10-04 ENCOUNTER — Encounter (HOSPITAL_COMMUNITY): Payer: Self-pay | Admitting: Obstetrics & Gynecology

## 2014-10-04 MED ORDER — TERBUTALINE SULFATE 1 MG/ML IJ SOLN
0.2500 mg | Freq: Once | INTRAMUSCULAR | Status: AC | PRN
  Filled 2014-10-04: qty 1

## 2014-10-04 MED ORDER — OXYTOCIN 40 UNITS IN LACTATED RINGERS INFUSION - SIMPLE MED: 1.0000 m[IU]/min | INTRAVENOUS | Status: DC

## 2014-10-04 NOTE — Progress Notes (Signed)
UR chart review completed.  

## 2014-10-04 NOTE — Lactation Note (Signed)
This note was copied from the chart of Boy Afnan Ballowe. Lactation Consultation Note  Patient Name: Boy Maygan Koeller ZOXWR'U Date: 10/04/2014 Reason for consult: Follow-up assessment   Follow-up at 39 hours old.  Mom is a P1. Infant has breastfed x8 (10-60 min) + attempt x2 (0 min) in past 24 hours; voids-5 in 24 hours/ 6 life; stools-3 in 24 hours & life.  LS-8 by RN. Infant lying in mom's arms STS at breast when [facility] entered room and began cuing.  Mom attempted latching in cross-cradle hold with belly positioned upward and head past nipple.  Shallow latch.  LC assisted with attaining depth in cross-cradle hold.  Taught mom how to sandwich breast for a deeper latch and positioned baby's body chest-to-chest with mom.  Infant took several sucks and went to sleep.  LS-8.  Mom reports knowing how to hand express and reports being able to hand express colostrum.     Mom c/o baby falling asleep at breast and was unsure of "how much baby is getting."  Reassurance given to mom and discussed baby's progress, normal infant newborn behavior, and current feeding positioning and depth.  Discussed supply and demand and encouraged to continue exclusive breastfeeding with cues.  Reviewed cluster feeding.   Mom has hand pump in room that she can use after discharge home.  Discussed engorgement prevention as milk volume begins to increase.   Informed of outpatient services and support group.  Reviewed breastfeeding information in Baby & Me booklet to reassure parents of infant's progress.   Encouraged to call for assistance as needed.    Feeding Feeding Type: Breast Fed  LATCH Score/Interventions Latch: Grasps breast easily, tongue down, lips flanged, rhythmical sucking. Intervention(s): Waking techniques;Teach feeding cues;Skin to skin  Audible Swallowing: A few with stimulation  Type of Nipple: Everted at rest and after stimulation  Comfort (Breast/Nipple): Soft / non-tender     Hold  (Positioning): Assistance needed to correctly position infant at breast and maintain latch. Intervention(s): Breastfeeding basics reviewed;Position options;Support Pillows  LATCH Score: 8  Lactation Tools Discussed/Used WIC Program: No   Consult Status Consult Status: Follow-up Date: 10/05/14 Follow-up type: In-patient    Lendon Ka 10/04/2014, 1:43 PM

## 2014-10-04 NOTE — Progress Notes (Signed)
Subjective: Postpartum Day 1: Cesarean Delivery Patient reports incisional pain, tolerating PO, + flatus and no problems voiding.    Objective: Vital signs in last 24 hours: Temp:  [98.3 F (36.8 C)-98.7 F (37.1 C)] 98.3 F (36.8 C) (08/08 0539) Pulse Rate:  [85-110] 85 (08/08 0539) Resp:  [14-23] 20 (08/08 0539) BP: (109-133)/(60-79) 111/62 mmHg (08/08 0539) SpO2:  [98 %-100 %] 98 % (08/07 2018)  Physical Exam:  General: alert, cooperative, appears stated age and no distress Lochia: appropriate Uterine Fundus: firm Incision: healing well, no significant drainage, no dehiscence, no significant erythema DVT Evaluation: No evidence of DVT seen on physical exam. Negative Homan's sign. No cords or calf tenderness.   Recent Labs  10/03/14 0520 10/03/14 0845  HGB 9.0* 8.6*  HCT 26.5* 24.8*    Assessment/Plan: Status post Cesarean section. Doing well postoperatively.  Continue current care.  Deshunda Thackston DARLENE 10/04/2014, 6:18 AM

## 2014-10-04 NOTE — Progress Notes (Signed)
Dilaudid PCA discontinued by MD. Notified Tiffany (pharmacist) in Pharmacy and was told to waste in sink and have a witness verify amount. Wasted 24 ML of Dilaudid in sink. Witnessed by Nemiah Commander RN.

## 2014-10-05 MED ORDER — IBUPROFEN 600 MG PO TABS: 600.0000 mg | ORAL_TABLET | Freq: Four times a day (QID) | ORAL | Status: DC

## 2014-10-05 MED ORDER — ACETAMINOPHEN 325 MG PO TABS: 650.0000 mg | ORAL_TABLET | ORAL | Status: DC | PRN

## 2014-10-05 MED ORDER — OXYCODONE-ACETAMINOPHEN 5-325 MG PO TABS: 1.0000 | ORAL_TABLET | ORAL | Status: DC | PRN

## 2014-10-05 MED ORDER — FERROUS SULFATE 325 (65 FE) MG PO TABS: 325.0000 mg | ORAL_TABLET | Freq: Two times a day (BID) | ORAL | Status: DC

## 2014-10-05 NOTE — Discharge Summary (Signed)
Obstetric Discharge Summary  Reason for Admission: induction of labor Prenatal Procedures: NST and ultrasound Intrapartum Procedures: cesarean: low cervical, transverse Postpartum Procedures: none Complications-Operative and Postpartum: none  Cesarean Section Procedure Note, emergent for cord prolapse  Indications: prolapsed umbilical cord  Pre-operative Diagnosis: 39 week 1 day pregnancy.  Post-operative Diagnosis: same  Surgeon: Scheryl Darter   Assistants: Dr. Shonna Chock  Anesthesia: General endotracheal anesthesia  ASA Class: 1   Procedure Details   The patient was seen in the Holding Room. The risks, benefits, complications, treatment options, and expected outcomes were discussed with the patient. The patient concurred with the proposed plan, giving informed consent. The site of surgery properly noted/marked. The patient was taken to Operating Room # 9, identified as JAMESON Mary Meyer and the procedure verified as C-Section Delivery. A Time Out was held and the above information confirmed. She was in labor at 39 weeks and prolapsed umbilical cord was diagnosed when she began pushing. The procedure and the risk of anesthesia, bleeding,transfusion, infection, postoperative pain and bowel and bladder injury were discussed and her questions were answered. Dr. Ashok Pall continued to elevate the fetal head until the fetus was delivered. Time out was performed. After induction of anesthesia, the patient was draped and prepped in the usual sterile manner. A Pfannenstiel incision was made and carried down through the subcutaneous tissue to the fascia. Fascial incision was made and extended transversely. The fascia was separated from the underlying rectus tissue superiorly and inferiorly. The peritoneum was identified and entered. Peritoneal incision was extended longitudinally. A low transverse uterine incision was made. Delivered from cephalic presentation was a 3035 gram Female with Apgar  scores of 8 at one minute and 9 at five minutes. After the umbilical cord was clamped and cut cord blood was obtained for evaluation. The placenta was removed intact and appeared normal. The uterine outline, tubes and ovaries appeared normal. The uterine incision was closed with running locked sutures of 0 Vicryl and an imbricating layer followed. Hemostasis was observed. The peritoneum was closed with 2-0 Vicryl. The fascia was then reapproximated with running sutures of 0 Vicryl and  . The skin was reapproximated with 4-0 Vicryl.  Instrument, sponge, and needle counts were correct prior the abdominal closure and at the conclusion of the case.An X ray was performed to confirm.   Findings: Liveborn female  Estimated Blood Loss: 1000 ml   Drains: Foley catheter placed at the end of the procedure   Total IV Fluids: 2000 ml   Specimens: none        Complications: None; patient tolerated the procedure well.   Disposition: PACU - hemodynamically stable.   Condition: stable  Attending Attestation: I was present and scrubbed for the entire procedure.   Adam Phenix, MD 10/03/2014 2:18 AM  Hospital Course:  Active Problems:   Polyhydramnios   ARADHANA Mary Meyer is a 24 y.o. G1P1001 s/p emergent LTCS.  Patient was admitted IOL 2/2 polyhydramnios. She suffered a prolapsed cord at approximately 8 cm dilation and received STAT LTCS c/b EBL 1000 ml. H trended from 12.2 prior to surgery To 8.6 after surgery; astymptomatic.  She has postpartum course that was uncomplicated. The pt feels ready to go home and  will be discharged with outpatient follow-up.   Today: No acute events overnight.  Pt denies problems with ambulating, voiding or po intake.  She denies nausea or vomiting.  Pain is well controlled.  She has had flatus. She has not had bowel movement.  Lochia  Minimal.  Plan for birth control is  Depo-Provera.  Method of Feeding:  breast  Physical Exam:  General: alert Lochia: appropriate Uterine Fundus: firm Incision: healing well, no significant drainage, no dehiscence, no significant erythema DVT Evaluation: No evidence of DVT seen on physical exam.  H/H: Lab Results  Component Value Date/Time   HGB 8.6* 10/03/2014 08:45 AM   HGB 14.3 03/04/2014   HCT 24.8* 10/03/2014 08:45 AM   HCT 35.6 07/14/2014 09:17 AM   HCT 44 03/04/2014    Discharge Diagnoses: Term Pregnancy-delivered  Discharge Information: Date: 10/05/2014 Activity: pelvic rest Diet: routine  Medications: Ibuprofen, Iron and Percocet Breast feeding:  Yes Condition: stable Instructions: refer to handout Discharge to: home   Discharge Instructions     Remove dressing in 72 hours    Complete by:  As directed      Call MD for:  difficulty breathing, headache or visual disturbances    Complete by:  As directed      Call MD for:  extreme fatigue    Complete by:  As directed      Call MD for:  hives    Complete by:  As directed      Call MD for:  persistant dizziness or light-headedness    Complete by:  As directed      Call MD for:  persistant nausea and vomiting    Complete by:  As directed      Call MD for:  redness, tenderness, or signs of infection (pain, swelling, redness, odor or green/yellow discharge around incision site)    Complete by:  As directed      Call MD for:  severe uncontrolled pain    Complete by:  As directed      Call MD for:  temperature >100.4    Complete by:  As directed      Diet general    Complete by:  As directed      Lifting restrictions    Complete by:  As directed   Weight restriction of 15 lbs.     Sexual acrtivity    Complete by:  As directed   Nothing in the vagina for at least 2 weeks            Medication List    STOP taking these medications        ranitidine 150 MG tablet  Commonly known as:  ZANTAC      TAKE these medications        acetaminophen 325 MG tablet  Commonly known  as:  TYLENOL  Take 2 tablets (650 mg total) by mouth every 4 (four) hours as needed (for pain scale < 4).     CITRANATAL ASSURE 35-1 & 300 MG tablet  Take 1 tablet by mouth daily.     ibuprofen 600 MG tablet  Commonly known as:  ADVIL,MOTRIN  Take 1 tablet (600 mg total) by mouth every 6 (six) hours.     oxyCODONE-acetaminophen 5-325 MG per tablet  Commonly known as:  PERCOCET/ROXICET  Take 1 tablet by mouth every 4 (four) hours as needed (for pain scale 4-7).           Follow-up Information    Follow up with [facility] In 6 weeks.   Contact information:   [address] [place] [address] [phone]      Silvano Bilis ,MD OB Fellow 10/05/2014,7:30 AM

## 2014-10-05 NOTE — Lactation Note (Signed)
This note was copied from the chart of Mary Marieli Prochazka. Lactation Consultation Note: Mother's breast are filling . She states she feels slight tenderness when infant is feeding. Nipple tissue intact. Reviewed cluster feeding . Advised mother to do STS to rouse infant and  mother to do good breast massage and ice to assist with good milk removal. Mother receptive to all teaching. Dr Ave Filter in to exam infant. i will follow up to see feeding piror to discharge. Infant has had multiple feedings with good wet and dirty diapers. Infant is at 7 % weight loss this am.  Follow up to see feeding and mother states that infant is sleeping and this is only the second time he has slept this good. Mother advised to page to check latch if needed. Mother states that she is sore only will initial latch. Mother was given comfort gels . Informed mother of BFSG's and outpatient services.   Patient Name: Mary Meyer ZOXWR'U Date: 10/05/2014 Reason for consult: Follow-up assessment   Maternal Data    Feeding Feeding Type: Breast Fed  LATCH Score/Interventions       Type of Nipple: Everted at rest and after stimulation  Comfort (Breast/Nipple): Filling, red/small blisters or bruises, mild/mod discomfort           Lactation Tools Discussed/Used     Consult Status      Mary Meyer 10/05/2014, 10:21 AM

## 2014-10-05 NOTE — Discharge Instructions (Signed)

## 2014-10-05 NOTE — Clinical Documentation Improvement (Signed)
  Possible Clinical Conditions? (Please respond in Discharge Summary or Progress Note)   Expected Acute Blood Loss Anemia  Acute Blood Loss Anemia  Acute on chronic blood loss anemia  Chronic blood loss anemia  Precipitous drop in Hematocrit  Other Condition________________  Cannot Clinically Determine  Supporting Information: Op Note states EBL =   Diagnostics: Hemoglobin on admit:12.2: Post OP Hemoglobin: 9.0 Current Hemoglobin: 8.6   Thank You,  Alesia Richards, RN CDS [facility] Health Information Management Richards [email or web address] [phone]

## 2014-10-06 ENCOUNTER — Telehealth: Payer: Self-pay | Admitting: *Deleted

## 2014-10-06 ENCOUNTER — Telehealth: Payer: Self-pay | Admitting: Obstetrics & Gynecology

## 2014-10-06 ENCOUNTER — Inpatient Hospital Stay (HOSPITAL_COMMUNITY)
Admission: AD | Admit: 2014-10-06 | Discharge: 2014-10-06 | Disposition: A | Discharge status: Home / Self Care | Payer: Medicaid Other | Source: Ambulatory Visit | Attending: Obstetrics & Gynecology | Admitting: Obstetrics & Gynecology

## 2014-10-06 ENCOUNTER — Encounter (HOSPITAL_COMMUNITY): Payer: Self-pay | Admitting: *Deleted

## 2014-10-06 DIAGNOSIS — O9089 Other complications of the puerperium, not elsewhere classified: Secondary | ICD-10-CM | POA: Insufficient documentation

## 2014-10-06 DIAGNOSIS — R609 Edema, unspecified: Secondary | ICD-10-CM | POA: Diagnosis present

## 2014-10-06 DIAGNOSIS — R600 Localized edema: Secondary | ICD-10-CM | POA: Insufficient documentation

## 2014-10-06 DIAGNOSIS — M7989 Other specified soft tissue disorders: Secondary | ICD-10-CM | POA: Diagnosis not present

## 2014-10-06 DIAGNOSIS — O3470 Maternal care for abnormality of vulva and perineum, unspecified trimester: Secondary | ICD-10-CM

## 2014-10-06 DIAGNOSIS — Z87891 Personal history of nicotine dependence: Secondary | ICD-10-CM | POA: Diagnosis not present

## 2014-10-06 DIAGNOSIS — N909 Noninflammatory disorder of vulva and perineum, unspecified: Secondary | ICD-10-CM | POA: Insufficient documentation

## 2014-10-06 LAB — CBC WITH DIFFERENTIAL/PLATELET
BASOS ABS: 0 10*3/uL (ref 0.0–0.1)
Basophils Relative: 0 % (ref 0–1)
Eosinophils Absolute: 0.4 10*3/uL (ref 0.0–0.7)
Eosinophils Relative: 4 % (ref 0–5)
HCT: 23.7 % — ABNORMAL LOW (ref 36.0–46.0)
Hemoglobin: 8 g/dL — ABNORMAL LOW (ref 12.0–15.0)
LYMPHS PCT: 16 % (ref 12–46)
Lymphs Abs: 1.5 10*3/uL (ref 0.7–4.0)
MCH: 31.4 pg (ref 26.0–34.0)
MCHC: 33.8 g/dL (ref 30.0–36.0)
MCV: 92.9 fL (ref 78.0–100.0)
MONO ABS: 0.4 10*3/uL (ref 0.1–1.0)
Monocytes Relative: 4 % (ref 3–12)
Neutro Abs: 7.2 10*3/uL (ref 1.7–7.7)
Neutrophils Relative %: 76 % (ref 43–77)
Platelets: 216 10*3/uL (ref 150–400)
RBC: 2.55 MIL/uL — ABNORMAL LOW (ref 3.87–5.11)
RDW: 14.1 % (ref 11.5–15.5)
WBC: 9.5 10*3/uL (ref 4.0–10.5)

## 2014-10-06 MED ORDER — HYDROCHLOROTHIAZIDE 25 MG PO TABS: 25.0000 mg | ORAL_TABLET | Freq: Every day | ORAL | Status: DC

## 2014-10-06 NOTE — MAU Note (Signed)
Pt reports labial swelling that has gotten worse since discharge yesterday. Had c-section on Sunday.

## 2014-10-06 NOTE — Discharge Instructions (Signed)
Edema  Edema is an abnormal buildup of fluids. It is more common in your legs and thighs. Painless swelling of the feet and ankles is more likely as a person ages. It also is common in looser skin, like around your eyes.  HOME CARE   · Keep the affected body part above the level of the heart while lying down.  · Do not sit still or stand for a long time.  · Do not put anything right under your knees when you lie down.  · Do not wear tight clothes on your upper legs.  · Exercise your legs to help the puffiness (swelling) go down.  · Wear elastic bandages or support stockings as told by your doctor.  · A low-salt diet may help lessen the puffiness.  · Only take medicine as told by your doctor.  GET HELP IF:  · Treatment is not working.  · You have heart, liver, or kidney disease and notice that your skin looks puffy or shiny.  · You have puffiness in your legs that does not get better when you raise your legs.  · You have sudden weight gain for no reason.  GET HELP RIGHT AWAY IF:   · You have shortness of breath or chest pain.  · You cannot breathe when you lie down.  · You have pain, redness, or warmth in the areas that are puffy.  · You have heart, liver, or kidney disease and get edema all of a sudden.  · You have a fever and your symptoms get worse all of a sudden.  MAKE SURE YOU:   · Understand these instructions.  · Will watch your condition.  · Will get help right away if you are not doing well or get worse.  Document Released: 08/01/2007 Document Revised: 02/17/2013 Document Reviewed: 12/05/2012  ExitCare® Patient Information ©2015 [employer]. This information is not intended to replace advice given to you by your health care provider. Make sure you discuss any questions you have with your health care provider.

## 2014-10-06 NOTE — Telephone Encounter (Signed)
Pt left message stating that she has concerns about some swelling and would like a call back.

## 2014-10-06 NOTE — MAU Provider Note (Signed)
History     CSN: [id number]  Arrival date and time: 10/06/14 1033   First Provider Initiated Contact with Patient 10/06/14 1118      Chief Complaint  Patient presents with  . Edema   HPI Mary Meyer 24 y.o. G1P1001 s/p c-section on 10/03/14 is here for swelling below the waistline.  It is worst in labia and she cannot close her legs together.  She also notes swelling in lower extremities but it improves with elevating feet.  She denies HA and vision changes, abdominal pain.  OB History    Gravida Para Term Preterm AB TAB SAB Ectopic Multiple Living   1 1 1       0 1      Past Medical History  Diagnosis Date  . Tachycardia     Past Surgical History  Procedure Laterality Date  . Cardiac catheterization  2009    states due to tacycardia, had alblation  . Cardiac catheterization  2009  . Cesarean section N/A 10/03/2014    Procedure: CESAREAN SECTION;  Surgeon: Adam Phenix, MD;  Location: [facility];  Service: Obstetrics;  Laterality: N/A;    No family history on file.  Social History  Substance Use Topics  . Smoking status: Former Smoker    Types: Cigarettes  . Smokeless tobacco: Never Used  . Alcohol Use: No    Allergies: No Known Allergies  Prescriptions prior to admission  Medication Sig Dispense Refill Last Dose  . ferrous sulfate (FERROUSUL) 325 (65 FE) MG tablet Take 1 tablet (325 mg total) by mouth 2 (two) times daily. 60 tablet 1 10/06/2014 at Unknown time  . ibuprofen (ADVIL,MOTRIN) 600 MG tablet Take 1 tablet (600 mg total) by mouth every 6 (six) hours. 90 tablet 0 10/06/2014 at Unknown time  . oxyCODONE-acetaminophen (PERCOCET/ROXICET) 5-325 MG per tablet Take 1 tablet by mouth every 4 (four) hours as needed (for pain scale 4-7). 30 tablet 0 10/05/2014 at Unknown time  . Prenat w/o A-FeCbGl-DSS-FA-DHA (CITRANATAL ASSURE) 35-1 & 300 MG tablet Take 1 tablet by mouth daily. 60 tablet 11 Past Week at Unknown time  . acetaminophen (TYLENOL) 325 MG tablet Take 2  tablets (650 mg total) by mouth every 4 (four) hours as needed (for pain scale < 4). 60 tablet 3     ROS Pertinent ROS in HPI.  All other systems are negative.   Physical Exam   Blood pressure 131/79, pulse 77, temperature 97.9 F (36.6 C), resp. rate 18, height 5\' 3"  (1.6 m), weight 144 lb 9.6 oz (65.59 kg), last menstrual period 01/11/2014, SpO2 100 %, currently breastfeeding.  Physical Exam  Constitutional: She is oriented to person, place, and time. She appears well-developed and well-nourished. No distress.  HENT:  Head: Normocephalic and atraumatic.  Eyes: EOM are normal.  Neck: Normal range of motion.  Cardiovascular: Normal rate.   No pain of calves Significant swelling bilat lower ext  Respiratory: Effort normal and breath sounds normal. No respiratory distress.  GI:  Incision appears to be well healing without sign of infection  Genitourinary:  Labia majora and labia minor with extensive swelling, purple in color, exquisite tenderness  Neurological: She is alert and oriented to person, place, and time.  Skin: Skin is warm and dry.  Psychiatric: She has a normal mood and affect.    MAU Course  Procedures  MDM Dr. Emelda Fear in to see pt.  He advises for CBC, and if normal, okay to discharge with 5-7 days of HCTZ   and f/u in office next week.  Results for orders placed or performed during the hospital encounter of 10/06/14 (from the past 24 hour(s))  CBC with Differential/Platelet     Status: Abnormal   Collection Time: 10/06/14 11:40 AM  Result Value Ref Range   WBC 9.5 4.0 - 10.5 K/uL   RBC 2.55 (L) 3.87 - 5.11 MIL/uL   Hemoglobin 8.0 (L) 12.0 - 15.0 g/dL   HCT 69.6 (L) 29.5 - 28.4 %   MCV 92.9 78.0 - 100.0 fL   MCH 31.4 26.0 - 34.0 pg   MCHC 33.8 30.0 - 36.0 g/dL   RDW 13.2 44.0 - 10.2 %   Platelets 216 150 - 400 K/uL   Neutrophils Relative % 76 43 - 77 %   Neutro Abs 7.2 1.7 - 7.7 K/uL   Lymphocytes Relative 16 12 - 46 %   Lymphs Abs 1.5 0.7 - 4.0 K/uL    Monocytes Relative 4 3 - 12 %   Monocytes Absolute 0.4 0.1 - 1.0 K/uL   Eosinophils Relative 4 0 - 5 %   Eosinophils Absolute 0.4 0.0 - 0.7 K/uL   Basophils Relative 0 0 - 1 %   Basophils Absolute 0.0 0.0 - 0.1 K/uL     Assessment and Plan  A:  1. Localized swelling of lower extremity   2. Abnormality of vulva, postpartum condition or complication    P: Discharge to home Ice packs/sitz baths HCTZ  daily x 5-7 days F/u at [facility] next week.  Patient may return to MAU as needed or if her condition were to change or worsen   Bertram Denver 10/06/2014, 11:19 AM

## 2014-10-06 NOTE — Telephone Encounter (Signed)
Pt states she delivered 10/03/2014 by c-section. Pt states "vagina really swollen, cannot close her legs, vagina really tight and hurts." Pt states crying she is in a lot of pain "it is really really bad." Pt advised to go to MAU for evaluation now. Pt verbalized understanding.

## 2014-10-06 NOTE — Telephone Encounter (Signed)
Pt advised to go to MAU now

## 2014-10-07 NOTE — Telephone Encounter (Signed)
Contacted patient, patient was seen in MAU after her call to clinic, states issue has resolved.  Pt has no further questions.

## 2014-10-12 ENCOUNTER — Ambulatory Visit (INDEPENDENT_AMBULATORY_CARE_PROVIDER_SITE_OTHER): Payer: Medicaid Other | Admitting: Obstetrics and Gynecology

## 2014-10-12 ENCOUNTER — Encounter: Payer: Self-pay | Admitting: Obstetrics and Gynecology

## 2014-10-12 VITALS — BP 112/60 | Ht 63.0 in | Wt 128.0 lb

## 2014-10-12 DIAGNOSIS — Z9889 Other specified postprocedural states: Secondary | ICD-10-CM

## 2014-10-12 DIAGNOSIS — O1205 Gestational edema, complicating the puerperium: Secondary | ICD-10-CM | POA: Insufficient documentation

## 2014-10-12 HISTORY — DX: Gestational edema, complicating the puerperium: O12.05

## 2014-10-12 NOTE — Progress Notes (Signed)
Patient ID: Mary Meyer, female   DOB: [birth year], 24 y.o.   MRN: [id number] Pt here today for incision check. Pt denies any problems or concerns at this time. Pt denies any redness or drainage from her incision.     Subjective:  Mary Meyer is a 24 y.o. female now 1 weeks status post cesarean section, notable for ED visit for severe vulvar swelling on pod 4.    pt weighed 150 at del, now 128. Swelling resolved dramatically with 3 HCTZ pills , No pih sx Review of Systems Negative except    Diet:   reg   Bowel movements : normal.  Pain is controlled with current analgesics. Medications being used: prescription NSAID's including ibuprofen (Motrin).  Objective:  BP 112/60 mmHg  Ht  (1.6 m)  Wt 128 lb (58.06 kg)  BMI 22.68 kg/m2  LMP 01/11/2014  Breastfeeding? Yes General:Well developed, well nourished.  No acute distress. Abdomen: Bowel sounds normal, soft, non-tender. Pelvic Exam:    External Genitalia:  Normal.    Vagina: Normal    *  Incision(s):   Healing well, no drainage, no erythema, no hernia, no swelling, no dehiscence,     Assessment:  Post-Op 1 weeks s/p c/s  Done by Dustin Flock  postpartum edema  now resolved resolced edema  Doing well postoperatively.   Plan:  1.Wound care discussed   2. . current medications.ibuprofen , stop diuretic already 3. Activity restrictions: no lifting more than 15 pounds 4. return to work: not applicable. 5. Follow up in 4 week.

## 2014-10-28 ENCOUNTER — Ambulatory Visit: Payer: Medicaid Other | Admitting: Advanced Practice Midwife

## 2014-10-29 ENCOUNTER — Encounter: Payer: Self-pay | Admitting: Obstetrics & Gynecology

## 2014-10-29 ENCOUNTER — Ambulatory Visit (INDEPENDENT_AMBULATORY_CARE_PROVIDER_SITE_OTHER): Payer: Medicaid Other | Admitting: Obstetrics & Gynecology

## 2014-10-29 MED ORDER — HYDROCODONE-ACETAMINOPHEN 5-325 MG PO TABS: 1.0000 | ORAL_TABLET | Freq: Four times a day (QID) | ORAL | Status: DC | PRN

## 2014-10-29 MED ORDER — MEDROXYPROGESTERONE ACETATE 150 MG/ML IM SUSP: 150.0000 mg | INTRAMUSCULAR | Status: DC

## 2014-11-12 ENCOUNTER — Ambulatory Visit (INDEPENDENT_AMBULATORY_CARE_PROVIDER_SITE_OTHER): Payer: Medicaid Other | Admitting: *Deleted

## 2014-11-12 ENCOUNTER — Ambulatory Visit: Payer: Medicaid Other | Admitting: Obstetrics & Gynecology

## 2014-11-12 ENCOUNTER — Encounter: Payer: Self-pay | Admitting: *Deleted

## 2014-11-12 DIAGNOSIS — Z3042 Encounter for surveillance of injectable contraceptive: Secondary | ICD-10-CM | POA: Diagnosis not present

## 2014-11-12 DIAGNOSIS — Z3202 Encounter for pregnancy test, result negative: Secondary | ICD-10-CM | POA: Diagnosis not present

## 2014-11-12 LAB — POCT URINE PREGNANCY: Preg Test, Ur: NEGATIVE

## 2014-11-12 MED ORDER — MEDROXYPROGESTERONE ACETATE 150 MG/ML IM SUSP
150.0000 mg | Freq: Once | INTRAMUSCULAR | Status: AC
  Administered 2014-11-12: 150 mg via INTRAMUSCULAR

## 2014-11-12 NOTE — Progress Notes (Signed)
Pt here for Depo. Pt mentioned a foul smell in vagina. Pt is spotting. I advised if smell continues after spotting stops or if smell gets worse, let us know. Pt voiced understanding. Return in 12 weeks for next shot. JSY

## 2014-12-09 ENCOUNTER — Telehealth: Payer: Self-pay | Admitting: Obstetrics & Gynecology

## 2014-12-09 NOTE — Telephone Encounter (Signed)
Spoke with pt. Pt got 1st Depo 3 weeks ago. She has been bleeding x 2 weeks. Some days it's spotting, other days, heavier. I advised it's not uncommon to have irregular bleeding when you start a new birth control. I advised to give it more time to see what it's gonna do with her period. If period stays heavy, call us back. Pt voiced understanding. JSY

## 2014-12-20 ENCOUNTER — Telehealth: Payer: Self-pay | Admitting: Obstetrics & Gynecology

## 2014-12-20 NOTE — Telephone Encounter (Signed)
Pt c/o vaginal bleeding x 5 weeks with depo provera, vaginal odor, and abdominal pain. Pt given an appt with Dr.Eure for 12/24/2014 for evaluation.

## 2014-12-24 ENCOUNTER — Encounter: Payer: Self-pay | Admitting: Obstetrics & Gynecology

## 2014-12-24 ENCOUNTER — Ambulatory Visit (INDEPENDENT_AMBULATORY_CARE_PROVIDER_SITE_OTHER): Payer: Medicaid Other | Admitting: Obstetrics & Gynecology

## 2014-12-24 VITALS — BP 100/70 | HR 84 | Wt 119.0 lb

## 2014-12-24 DIAGNOSIS — N939 Abnormal uterine and vaginal bleeding, unspecified: Secondary | ICD-10-CM

## 2014-12-24 DIAGNOSIS — N938 Other specified abnormal uterine and vaginal bleeding: Secondary | ICD-10-CM

## 2014-12-24 LAB — POCT HEMOGLOBIN: Hemoglobin: 13.9 g/dL (ref 12.2–16.2)

## 2014-12-24 MED ORDER — ESTRADIOL 2 MG PO TABS: 2.0000 mg | ORAL_TABLET | Freq: Every day | ORAL | Status: DC

## 2014-12-24 NOTE — Progress Notes (Signed)
Patient ID: Mary Meyer, female   DOB: 1990/06/15, 24 y.o.   MRN: [id number] Chief Complaint  Patient presents with  . gyn visit    BTB on Depo-provera.    Blood pressure 100/70, pulse 84, weight 119 lb (53.978 kg), currently breastfeeding.  24 y.o. G1P1001 No LMP recorded. The current method of family planning is Depo-Provera injections.  Subjective Pt has been bleeding almost daily, variable in volume and color, brown red pink Some cramping associated  Objective   Pertinent ROS Some anxiety and depression, adjustment transition  Labs or studies     Impression Diagnoses this Encounter::   ICD-9-CM ICD-10-CM   1. DUB (dysfunctional uterine bleeding) 626.8 N93.8   2. Vaginal bleeding 623.8 N93.9 POCT hemoglobin    Established relevant diagnosis(es):   Plan/Recommendations: Meds ordered this encounter  Medications  . estradiol (ESTRACE) 2 MG tablet    Sig: Take 1 tablet (2 mg total) by mouth daily.    Dispense:  30 tablet    Refill:  11    Labs or Scans Ordered: Orders Placed This Encounter  Procedures  . POCT hemoglobin      Follow up Return if symptoms worsen or fail to improve.      Face to face time:  15 minutes  Greater than 50% of the visit time was spent in counseling and coordination of care with the patient.  The summary and outline of the counseling and care coordination is summarized in the note above.   All questions were answered.

## 2014-12-29 NOTE — Progress Notes (Signed)
Patient ID: Mary Meyer, female   DOB: 05-28-1990, 24 y.o.   MRN: [id number] . Subjective:     Mary FreeKlarissa M Touch is a 24 y.o. female who presents for a postpartum visit. She is 1 month postpartum following a low cervical transverse Cesarean section. I have fully reviewed the prenatal and intrapartum course. The delivery was at 39 gestational weeks. Outcome: primary cesarean section, low transverse incision. Anesthesia: general. Postpartum course has been normal. Baby's course has been normal. Baby is feeding by . Bleeding staining only. Bowel function is normal. Bladder function is normal. Patient is not sexually active. Contraception method is none. Postpartum depression screening: negative.  The following portions of the patient's history were reviewed and updated as appropriate: allergies, current medications, past family history, past medical history, past social history, past surgical history and problem list.  Review of Systems Pertinent items noted in HPI and remainder of comprehensive ROS otherwise negative.   Objective:    BP 110/80 mmHg  Pulse 72  Wt 123 lb (55.792 kg)  General:  alert, cooperative and no distress   Breasts:    Lungs:   Heart:    Abdomen: soft, non-tender; bowel sounds normal; no masses,  no organomegaly   Vulva:  normal  Vagina: normal vagina  Cervix:  no cervical motion tenderness and no lesions  Corpus: normal size, contour, position, consistency, mobility, non-tender  Adnexa:  normal adnexa  Rectal Exam:         Assessment:     normal postpartum exam. Pap smear not done at today's visit.   Plan:    1. Contraception: Depo-Provera injections 2.  3. Follow up in: 3 months or as needed.

## 2015-01-26 ENCOUNTER — Encounter: Payer: Self-pay | Admitting: Women's Health

## 2015-01-26 ENCOUNTER — Ambulatory Visit (INDEPENDENT_AMBULATORY_CARE_PROVIDER_SITE_OTHER): Payer: Medicaid Other | Admitting: Women's Health

## 2015-01-26 ENCOUNTER — Telehealth: Payer: Self-pay | Admitting: Women's Health

## 2015-01-26 VITALS — BP 120/62 | HR 72 | Wt 125.0 lb

## 2015-01-26 DIAGNOSIS — Z113 Encounter for screening for infections with a predominantly sexual mode of transmission: Secondary | ICD-10-CM

## 2015-01-26 DIAGNOSIS — N921 Excessive and frequent menstruation with irregular cycle: Secondary | ICD-10-CM

## 2015-01-26 MED ORDER — MEGESTROL ACETATE 40 MG PO TABS: ORAL_TABLET | ORAL | Status: DC

## 2015-01-26 NOTE — Progress Notes (Signed)
Patient ID: Mary Meyer, female   DOB: 1990/08/22, 24 y.o.   MRN: [id number]   [facility]  Patient name: Mary Meyer MRN [id number]  Date of birth: 1990/08/22  CC & HPI:  Mary Meyer is a 24 y.o. 211P1001 Caucasian female presenting today for report of daily heavy bleeding since depo injection in Sept, changes saturated pads q 2hrs, no clots, some cramping. Has been on estradiol 2mg  daily x 1 month, states she feels like things are just getting worse. Baby almost 4mths old. Bottlefeeding. Due for 2nd depo shortly, states she just wants to come off all birth control and let her body regulate. Does not want pregnancy- plans to use condoms. Last pap 02/2014 neg.  No LMP recorded.  Pertinent History Reviewed:  Medical & Surgical Hx:   Past Medical History  Diagnosis Date  . Tachycardia    Past Surgical History  Procedure Laterality Date  . Cardiac catheterization  2009    states due to tacycardia, had alblation  . Cardiac catheterization  2009  . Cesarean section N/A 10/03/2014    Procedure: CESAREAN SECTION;  Surgeon: Adam PhenixJames G Arnold, MD;  Location: [facility];  Service: Obstetrics;  Laterality: N/A;   Medications: Reviewed & Updated - see associated section Social History: Reviewed -  reports that she has been smoking Cigarettes.  She has never used smokeless tobacco.  Objective Findings:  Vitals: BP 120/62 mmHg  Pulse 72  Wt 125 lb (56.7 kg)  Breastfeeding? No Body mass index is 22.15 kg/(m^2).  Physical Examination: General appearance - alert, well appearing, and in no distress Pelvic - normal external genitalia, cx appears normal, sm amt menstrual blood in vault, no CMT, uterus/adnexa feel normal w/o masses  Fingerstick Hgb: 14+  Assessment & Plan:  A:   DUB on depo  P:  GC/CT today  Stop estrace  Rx megace algorithm  Condoms for pregnancy prevention  Return for jan for physical.  Marge DuncansBooker, Jamar Weatherall Randall CNM,  [facility]NP-BC 01/26/2015 2:39 PM

## 2015-01-26 NOTE — Patient Instructions (Addendum)
Stop taking the estradiol (estrace), begin megace  Use condoms to prevent pregnancy

## 2015-01-26 NOTE — Telephone Encounter (Signed)
Pt informed results pending for GC/CHL that was done today, will take 3-5 days for results. Pt verbalized understanding.

## 2015-01-27 LAB — GC/CHLAMYDIA PROBE AMP
Chlamydia trachomatis, NAA: NEGATIVE
NEISSERIA GONORRHOEAE BY PCR: NEGATIVE

## 2015-02-04 ENCOUNTER — Ambulatory Visit: Payer: Medicaid Other

## 2015-03-28 ENCOUNTER — Other Ambulatory Visit: Payer: Medicaid Other | Admitting: Women's Health

## 2015-03-28 ENCOUNTER — Encounter: Payer: Self-pay | Admitting: Women's Health

## 2015-12-15 ENCOUNTER — Ambulatory Visit (INDEPENDENT_AMBULATORY_CARE_PROVIDER_SITE_OTHER): Payer: Medicaid Other | Admitting: Women's Health

## 2015-12-15 ENCOUNTER — Other Ambulatory Visit (HOSPITAL_COMMUNITY)
Admission: RE | Admit: 2015-12-15 | Discharge: 2015-12-15 | Disposition: A | Discharge status: Home / Self Care | Payer: Medicaid Other | Source: Ambulatory Visit | Attending: Obstetrics & Gynecology | Admitting: Obstetrics & Gynecology

## 2015-12-15 ENCOUNTER — Encounter: Payer: Self-pay | Admitting: Women's Health

## 2015-12-15 VITALS — BP 100/70 | HR 78 | Ht 63.0 in | Wt 116.0 lb

## 2015-12-15 DIAGNOSIS — N898 Other specified noninflammatory disorders of vagina: Secondary | ICD-10-CM

## 2015-12-15 DIAGNOSIS — Z0000 Encounter for general adult medical examination without abnormal findings: Secondary | ICD-10-CM | POA: Diagnosis not present

## 2015-12-15 DIAGNOSIS — Z01419 Encounter for gynecological examination (general) (routine) without abnormal findings: Secondary | ICD-10-CM | POA: Diagnosis not present

## 2015-12-15 DIAGNOSIS — F419 Anxiety disorder, unspecified: Secondary | ICD-10-CM | POA: Insufficient documentation

## 2015-12-15 DIAGNOSIS — Z113 Encounter for screening for infections with a predominantly sexual mode of transmission: Secondary | ICD-10-CM | POA: Insufficient documentation

## 2015-12-15 DIAGNOSIS — Z01411 Encounter for gynecological examination (general) (routine) with abnormal findings: Secondary | ICD-10-CM | POA: Insufficient documentation

## 2015-12-15 DIAGNOSIS — Z98891 History of uterine scar from previous surgery: Secondary | ICD-10-CM | POA: Insufficient documentation

## 2015-12-15 DIAGNOSIS — Z1151 Encounter for screening for human papillomavirus (HPV): Secondary | ICD-10-CM | POA: Insufficient documentation

## 2015-12-15 HISTORY — DX: History of uterine scar from previous surgery: Z98.891

## 2015-12-15 LAB — POCT WET PREP (WET MOUNT)
Clue Cells Wet Prep Whiff POC: NEGATIVE
Trichomonas Wet Prep HPF POC: ABSENT

## 2015-12-15 MED ORDER — CITRANATAL ASSURE 35-1 & 300 MG PO MISC: ORAL | 11 refills | Status: DC

## 2015-12-15 NOTE — Addendum Note (Signed)
Addended by: Colen DarlingYOUNG, JANET S on: 12/15/2015 03:21 PM   Modules accepted: Orders

## 2015-12-15 NOTE — Progress Notes (Signed)
Subjective:   Mary Meyer is a 25 y.o. 591P1001 Caucasian female here for a routine well-woman exam.  Patient's last menstrual period was 12/01/2015.   Not on birth control, 'not preventing' pregnancy, not taking pnv.  Current complaints: abnormal d/c, thinks has bacterial infection PCP: none       Does not desire labs  Social History: Sexual: heterosexual Marital Status: dating Living situation: w/ boyfriend and child Occupation: unemployed Tobacco/alcohol: smokes Hydrographic surveyor~2cigarettes/day, glass wine occ Illicit drugs: no history of illicit drug use  The following portions of the patient's history were reviewed and updated as appropriate: allergies, current medications, past family history, past medical history, past social history, past surgical history and problem list.  Past Medical History Past Medical History:  Diagnosis Date  . Anxiety   . Tachycardia     Past Surgical History Past Surgical History:  Procedure Laterality Date  . CARDIAC CATHETERIZATION  2009   states due to tacycardia, had alblation  . CARDIAC CATHETERIZATION  2009  . CESAREAN SECTION N/A 10/03/2014   Procedure: CESAREAN SECTION;  Surgeon: Adam PhenixJames G Arnold, MD;  Location: [facility];  Service: Obstetrics;  Laterality: N/A;    Gynecologic History G1P1001  Patient's last menstrual period was 12/01/2015. Contraception: none Last Pap: 02/2014. Results were: normal, has had h/o abnormal Last mammogram: never. Results were: n/a Last TCS: never  Obstetric History OB History  Gravida Para Term Preterm AB Living  1 1 1     1   SAB TAB Ectopic Multiple Live Births        0 1    # Outcome Date GA Lbr Len/2nd Weight Sex Delivery Anes PTL Lv  1 Term 10/03/14 [id number]  6 lb 11.1 oz (3.035 kg) M CS-LTranv None  LIV      Current Medications No current outpatient prescriptions on file prior to visit.   No current facility-administered medications on file prior to visit.     Review of Systems Patient denies any  headaches, blurred vision, shortness of breath, chest pain, abdominal pain, problems with bowel movements, urination, or intercourse.  Objective:  BP 100/70   Pulse 78   Ht 5\' 3"  (1.6 m)   Wt 116 lb (52.6 kg)   LMP 12/01/2015   BMI 20.55 kg/m  Physical Exam  General:  Well developed, well nourished, no acute distress. She is alert and oriented x3. Skin:  Warm and dry Neck:  Midline trachea, no thyromegaly or nodules Cardiovascular: Regular rate and rhythm, no murmur heard Lungs:  Effort normal, all lung fields clear to auscultation bilaterally Breasts:  No dominant palpable mass, retraction, or nipple discharge Abdomen:  Soft, non tender, no hepatosplenomegaly or masses Pelvic:  External genitalia is normal in appearance.  The vagina is normal in appearance. The cervix is bulbous, no CMT.  Thin prep pap is done w/ reflex HR HPV cotesting. Uterus is felt to be normal size, shape, and contour.  No adnexal masses or tenderness noted. Extremities:  No swelling or varicosities noted Psych:  She has a normal mood and affect  Results for orders placed or performed in visit on 12/15/15 (from the past 24 hour(s))  POCT Wet Prep Mellody Drown(Wet [place])     Status: Normal   Collection Time: 12/15/15  3:10 PM  Result Value Ref Range   Source Wet Prep POC vaginal    WBC, Wet Prep HPF POC few    Bacteria Wet Prep HPF POC None None, Few, Too numerous to count   BACTERIA WET  PREP MORPHOLOGY POC     Clue Cells Wet Prep HPF POC None None, Too numerous to count   Clue Cells Wet Prep Whiff POC Negative Whiff    Yeast Wet Prep HPF POC None    KOH Wet Prep POC     Trichomonas Wet Prep HPF POC Absent Absent     Assessment:   Healthy well-woman exam Not on birth control  Plan:  GC/CT from pap Needs to be taking pnv, wants refill on citranatal assure- sent today F/U 32yr for physical, or sooner if needed Mammogram @25yo  or sooner if problems Colonoscopy @25yo  or sooner if problems  Marge Duncans CNM, [facility] 12/15/2015 3:04 PM

## 2015-12-15 NOTE — Patient Instructions (Signed)
Start prenatal vitamin now.

## 2015-12-21 LAB — CYTOLOGY - PAP
Chlamydia: NEGATIVE
DIAGNOSIS: UNDETERMINED — AB
HPV (WINDOPATH): DETECTED — AB
NEISSERIA GONORRHEA: NEGATIVE

## 2015-12-23 ENCOUNTER — Telehealth: Payer: Self-pay | Admitting: *Deleted

## 2015-12-23 NOTE — Telephone Encounter (Signed)
Pt informed of abnormal pap results and instructed to call back up here Monday to schedule a colposcopy.  Pt verbalized understanding.

## 2016-01-06 ENCOUNTER — Encounter: Payer: Medicaid Other | Admitting: Obstetrics & Gynecology

## 2016-01-16 ENCOUNTER — Ambulatory Visit (INDEPENDENT_AMBULATORY_CARE_PROVIDER_SITE_OTHER): Payer: Medicaid Other | Admitting: Obstetrics & Gynecology

## 2016-01-16 ENCOUNTER — Other Ambulatory Visit: Payer: Self-pay | Admitting: Obstetrics & Gynecology

## 2016-01-16 ENCOUNTER — Encounter: Payer: Self-pay | Admitting: Obstetrics & Gynecology

## 2016-01-16 VITALS — BP 122/88 | HR 76 | Wt 118.0 lb

## 2016-01-16 DIAGNOSIS — R87810 Cervical high risk human papillomavirus (HPV) DNA test positive: Secondary | ICD-10-CM

## 2016-01-16 DIAGNOSIS — Z3202 Encounter for pregnancy test, result negative: Secondary | ICD-10-CM | POA: Diagnosis not present

## 2016-01-16 DIAGNOSIS — R87610 Atypical squamous cells of undetermined significance on cytologic smear of cervix (ASC-US): Secondary | ICD-10-CM | POA: Diagnosis not present

## 2016-01-16 LAB — POCT URINE PREGNANCY: PREG TEST UR: NEGATIVE

## 2016-01-16 NOTE — Progress Notes (Signed)
Colposcopy Procedure Note:  Colposcopy Procedure Note  Indications: Pap smear 1 months ago showed: ASCUS with POSITIVE high risk HPV. The prior pap showed ASCUS with POSITIVE high risk HPV.  Prior cervical/vaginal disease: . Prior cervical treatment: .  Smoker:  No. New sexual partner:  No.  : time frame:    History of abnormal Pap: yes  Procedure Details  The risks and benefits of the procedure and Written informed consent obtained.  Speculum placed in vagina and excellent visualization of cervix achieved, cervix swabbed x 3 with acetic acid solution.  Findings: Cervix: ; acetowhite changes of the endocervical tissue, biopsy take of the endocervix. Vaginal inspection: normal without visible lesions. Vulvar colposcopy: vulvar colposcopy not performed.  Specimens: cervical biopsy  Complications: none.  Plan: Return to discuss Pathology results in 1 week.

## 2016-01-24 ENCOUNTER — Ambulatory Visit (INDEPENDENT_AMBULATORY_CARE_PROVIDER_SITE_OTHER): Payer: Medicaid Other | Admitting: Obstetrics & Gynecology

## 2016-01-24 ENCOUNTER — Encounter: Payer: Self-pay | Admitting: Obstetrics & Gynecology

## 2016-01-24 VITALS — BP 118/80 | HR 70 | Ht 63.0 in | Wt 117.2 lb

## 2016-01-24 DIAGNOSIS — R87810 Cervical high risk human papillomavirus (HPV) DNA test positive: Secondary | ICD-10-CM

## 2016-01-24 DIAGNOSIS — R87610 Atypical squamous cells of undetermined significance on cytologic smear of cervix (ASC-US): Secondary | ICD-10-CM | POA: Diagnosis not present

## 2016-01-24 NOTE — Progress Notes (Signed)
Follow up appointment for results  Chief Complaint  Patient presents with  . Follow-up    discuss colposcopy results    Blood pressure 118/80, pulse 70, height 5\' 3"  (1.6 m), weight 117 lb 3.2 oz (53.2 kg), last menstrual period 01/02/2016, not currently breastfeeding.  Biopsy revealed benign squamous changes no atypia    MEDS ordered this encounter: No orders of the defined types were placed in this encounter.   Orders for this encounter: No orders of the defined types were placed in this encounter.   Impression: ASCUS with positive high risk HPV cervical     Plan: Pap 1 year  Follow Up: No Follow-up on file.       Face to face time:  10 minutes  Greater than 50% of the visit time was spent in counseling and coordination of care with the patient.  The summary and outline of the counseling and care coordination is summarized in the note above.   All questions were answered.  Past Medical History:  Diagnosis Date  . Anxiety   . Tachycardia     Past Surgical History:  Procedure Laterality Date  . CARDIAC CATHETERIZATION  2009   states due to tacycardia, had alblation  . CARDIAC CATHETERIZATION  2009  . CESAREAN SECTION N/A 10/03/2014   Procedure: CESAREAN SECTION;  Surgeon: Adam PhenixJames G Arnold, MD;  Location: [facility];  Service: Obstetrics;  Laterality: N/A;    OB History    Gravida Para Term Preterm AB Living   1 1 1     1    SAB TAB Ectopic Multiple Live Births         0 1      No Known Allergies  Social History   Social History  . Marital status: Single    Spouse name: N/A  . Number of children: N/A  . Years of education: N/A   Social History Main Topics  . Smoking status: Current Some Day Smoker    Types: Cigarettes  . Smokeless tobacco: Never Used  . Alcohol use 0.0 oz/week     Comment: occ beer  . Drug use: No  . Sexual activity: Yes    Birth control/ protection: None   Other Topics Concern  . None   Social History Narrative  .  None    Family History  Problem Relation Age of Onset  . Thyroid disease Maternal Aunt   . Heart Problems Maternal Aunt

## 2016-03-13 ENCOUNTER — Ambulatory Visit (INDEPENDENT_AMBULATORY_CARE_PROVIDER_SITE_OTHER): Payer: Medicaid Other | Admitting: Adult Health

## 2016-03-13 ENCOUNTER — Encounter: Payer: Self-pay | Admitting: Adult Health

## 2016-03-13 VITALS — BP 118/65 | HR 79 | Ht 64.0 in | Wt 122.0 lb

## 2016-03-13 DIAGNOSIS — F419 Anxiety disorder, unspecified: Secondary | ICD-10-CM

## 2016-03-13 DIAGNOSIS — Z3490 Encounter for supervision of normal pregnancy, unspecified, unspecified trimester: Secondary | ICD-10-CM

## 2016-03-13 DIAGNOSIS — N926 Irregular menstruation, unspecified: Secondary | ICD-10-CM | POA: Diagnosis not present

## 2016-03-13 DIAGNOSIS — Z3201 Encounter for pregnancy test, result positive: Secondary | ICD-10-CM | POA: Diagnosis not present

## 2016-03-13 DIAGNOSIS — O3680X0 Pregnancy with inconclusive fetal viability, not applicable or unspecified: Secondary | ICD-10-CM

## 2016-03-13 LAB — POCT URINE PREGNANCY: PREG TEST UR: POSITIVE — AB

## 2016-03-13 MED ORDER — FOLIC ACID 1 MG PO TABS: ORAL_TABLET | ORAL | 11 refills | Status: DC

## 2016-03-13 NOTE — Progress Notes (Signed)
Subjective:     Patient ID: Mary Meyer, female   DOB: 1990-07-27, 26 y.o.   MRN: [id number]  HPI Mary Meyer is a 26 year old white female in for UPT, has missed a period and had +HPT yesterday.She is on depakote and lexapro for anxiety and anger, [facility] Okey Regalarol.   Review of Systems +missed period,denies any nausea, vomiting or bleeding or cramping Reviewed past medical,surgical, social and family history. Reviewed medications and allergies.     Objective:   Physical Exam BP 118/65 (BP Location: Left Arm, Patient Position: Sitting, Cuff Size: Normal)   Pulse 79   Ht 5\' 4"  (1.626 m)   Wt 122 lb (55.3 kg)   LMP 01/15/2016 (Approximate)   Breastfeeding? No   BMI 20.94 kg/m UPT +, about 8+2 weeks by LMP with EDD 10/21/16. Skin warm and dry. Neck: mid line trachea, normal thyroid, good ROM, no lymphadenopathy noted. Lungs: clear to ausculation bilaterally. Cardiovascular: regular rate and rhythm.Abdomen is soft and non tender. PHQ 2 score 1.Discussed meds with Dr Despina HiddenEure, can continue for now.     Assessment:     1. Pregnancy examination or test, positive result   2. Pregnancy, unspecified gestational age   403. Anxiety   4. Encounter to determine fetal viability of pregnancy, single or unspecified fetus       Plan:     Meds ordered this encounter  Medications  . folic acid (FOLVITE) 1 MG tablet    Sig: Take 4 mg daily    Dispense:  120 tablet    Refill:  11    Order Specific Question:   Supervising Provider    Answer:   Lazaro ArmsEURE, LUTHER H [[id number]]  Ok to continue lexapro and depakote per Dr Despina HiddenEure Return in 1 week for dating US and intake  Review handout on first trimester

## 2016-03-13 NOTE — Patient Instructions (Signed)
First Trimester of Pregnancy  The first trimester of pregnancy is from week 1 until the end of week 12 (months 1 through 3). A week after a sperm fertilizes an egg, the egg will implant on the wall of the uterus. This embryo will begin to develop into a baby. Genes from you and your partner are forming the baby. The female genes determine whether the baby is a boy or a girl. At 6-8 weeks, the eyes and face are formed, and the heartbeat can be seen on ultrasound. At the end of 12 weeks, all the baby's organs are formed.   Now that you are pregnant, you will want to do everything you can to have a healthy baby. Two of the most important things are to get good prenatal care and to follow your health care provider's instructions. Prenatal care is all the medical care you receive before the baby's birth. This care will help prevent, find, and treat any problems during the pregnancy and childbirth.  BODY CHANGES  Your body goes through many changes during pregnancy. The changes vary from woman to woman.   · You may gain or lose a couple of pounds at first.  · You may feel sick to your stomach (nauseous) and throw up (vomit). If the vomiting is uncontrollable, call your health care provider.  · You may tire easily.  · You may develop headaches that can be relieved by medicines approved by your health care provider.  · You may urinate more often. Painful urination may mean you have a bladder infection.  · You may develop heartburn as a result of your pregnancy.  · You may develop constipation because certain hormones are causing the muscles that push waste through your intestines to slow down.  · You may develop hemorrhoids or swollen, bulging veins (varicose veins).  · Your breasts may begin to grow larger and become tender. Your nipples may stick out more, and the tissue that surrounds them (areola) may become darker.  · Your gums may bleed and may be sensitive to brushing and flossing.   · Dark spots or blotches (chloasma, mask of pregnancy) may develop on your face. This will likely fade after the baby is born.  · Your menstrual periods will stop.  · You may have a loss of appetite.  · You may develop cravings for certain kinds of food.  · You may have changes in your emotions from day to day, such as being excited to be pregnant or being concerned that something may go wrong with the pregnancy and baby.  · You may have more vivid and strange dreams.  · You may have changes in your hair. These can include thickening of your hair, rapid growth, and changes in texture. Some women also have hair loss during or after pregnancy, or hair that feels dry or thin. Your hair will most likely return to normal after your baby is born.  WHAT TO EXPECT AT YOUR PRENATAL VISITS  During a routine prenatal visit:  · You will be weighed to make sure you and the baby are growing normally.  · Your blood pressure will be taken.  · Your abdomen will be measured to track your baby's growth.  · The fetal heartbeat will be listened to starting around week 10 or 12 of your pregnancy.  · Test results from any previous visits will be discussed.  Your health care provider may ask you:  · How you are feeling.  · If you   are feeling the baby move.  · If you have had any abnormal symptoms, such as leaking fluid, bleeding, severe headaches, or abdominal cramping.  · If you are using any tobacco products, including cigarettes, chewing tobacco, and electronic cigarettes.  · If you have any questions.  Other tests that may be performed during your first trimester include:  · Blood tests to find your blood type and to check for the presence of any previous infections. They will also be used to check for low iron levels (anemia) and Rh antibodies. Later in the pregnancy, blood tests for diabetes will be done along with other tests if problems develop.  · Urine tests to check for infections, diabetes, or protein in the urine.   · An ultrasound to confirm the proper growth and development of the baby.  · An amniocentesis to check for possible genetic problems.  · Fetal screens for spina bifida and Down syndrome.  · You may need other tests to make sure you and the baby are doing well.  · HIV (human immunodeficiency virus) testing. Routine prenatal testing includes screening for HIV, unless you choose not to have this test.  HOME CARE INSTRUCTIONS   Medicines  · Follow your health care provider's instructions regarding medicine use. Specific medicines may be either safe or unsafe to take during pregnancy.  · Take your prenatal vitamins as directed.  · If you develop constipation, try taking a stool softener if your health care provider approves.  Diet  · Eat regular, well-balanced meals. Choose a variety of foods, such as meat or vegetable-based protein, fish, milk and low-fat dairy products, vegetables, fruits, and whole grain breads and cereals. Your health care provider will help you determine the amount of weight gain that is right for you.  · Avoid raw meat and uncooked cheese. These carry germs that can cause birth defects in the baby.  · Eating four or five small meals rather than three large meals a day may help relieve nausea and vomiting. If you start to feel nauseous, eating a few soda crackers can be helpful. Drinking liquids between meals instead of during meals also seems to help nausea and vomiting.  · If you develop constipation, eat more high-fiber foods, such as fresh vegetables or fruit and whole grains. Drink enough fluids to keep your urine clear or pale yellow.  Activity and Exercise  · Exercise only as directed by your health care provider. Exercising will help you:    Control your weight.    Stay in shape.    Be prepared for labor and delivery.  · Experiencing pain or cramping in the lower abdomen or low back is a good sign that you should stop exercising. Check with your health care provider  before continuing normal exercises.  · Try to avoid standing for long periods of time. Move your legs often if you must stand in one place for a long time.  · Avoid heavy lifting.  · Wear low-heeled shoes, and practice good posture.  · You may continue to have sex unless your health care provider directs you otherwise.  Relief of Pain or Discomfort  · Wear a good support bra for breast tenderness.    · Take warm sitz baths to soothe any pain or discomfort caused by hemorrhoids. Use hemorrhoid cream if your health care provider approves.    · Rest with your legs elevated if you have leg cramps or low back pain.  · If you develop varicose veins in your   legs, wear support hose. Elevate your feet for 15 minutes, 3-4 times a day. Limit salt in your diet.  Prenatal Care  · Schedule your prenatal visits by the twelfth week of pregnancy. They are usually scheduled monthly at first, then more often in the last 2 months before delivery.  · Write down your questions. Take them to your prenatal visits.  · Keep all your prenatal visits as directed by your health care provider.  Safety  · Wear your seat belt at all times when driving.  · Make a list of emergency phone numbers, including numbers for family, friends, the hospital, and police and fire departments.  General Tips  · Ask your health care provider for a referral to a local prenatal education class. Begin classes no later than at the beginning of month 6 of your pregnancy.  · Ask for help if you have counseling or nutritional needs during pregnancy. Your health care provider can offer advice or refer you to specialists for help with various needs.  · Do not use hot tubs, steam rooms, or saunas.  · Do not douche or use tampons or scented sanitary pads.  · Do not cross your legs for long periods of time.  · Avoid cat litter boxes and soil used by cats. These carry germs that can cause birth defects in the baby and possibly loss of the fetus by miscarriage or stillbirth.   · Avoid all smoking, herbs, alcohol, and medicines not prescribed by your health care provider. Chemicals in these affect the formation and growth of the baby.  · Do not use any tobacco products, including cigarettes, chewing tobacco, and electronic cigarettes. If you need help quitting, ask your health care provider. You may receive counseling support and other resources to help you quit.  · Schedule a dentist appointment. At home, brush your teeth with a soft toothbrush and be gentle when you floss.  SEEK MEDICAL CARE IF:   · You have dizziness.  · You have mild pelvic cramps, pelvic pressure, or nagging pain in the abdominal area.  · You have persistent nausea, vomiting, or diarrhea.  · You have a bad smelling vaginal discharge.  · You have pain with urination.  · You notice increased swelling in your face, hands, legs, or ankles.  SEEK IMMEDIATE MEDICAL CARE IF:   · You have a fever.  · You are leaking fluid from your vagina.  · You have spotting or bleeding from your vagina.  · You have severe abdominal cramping or pain.  · You have rapid weight gain or loss.  · You vomit blood or material that looks like coffee grounds.  · You are exposed to German measles and have never had them.  · You are exposed to fifth disease or chickenpox.  · You develop a severe headache.  · You have shortness of breath.  · You have any kind of trauma, such as from a fall or a car accident.     This information is not intended to replace advice given to you by your health care provider. Make sure you discuss any questions you have with your health care provider.     Document Released: 02/06/2001 Document Revised: 03/05/2014 Document Reviewed: 12/23/2012  Elsevier Interactive Patient Education ©2017 [employer]

## 2016-03-20 ENCOUNTER — Other Ambulatory Visit: Payer: Medicaid Other

## 2016-03-21 ENCOUNTER — Other Ambulatory Visit: Payer: Medicaid Other

## 2016-03-28 ENCOUNTER — Ambulatory Visit (INDEPENDENT_AMBULATORY_CARE_PROVIDER_SITE_OTHER): Payer: Medicaid Other | Admitting: *Deleted

## 2016-03-28 ENCOUNTER — Encounter (INDEPENDENT_AMBULATORY_CARE_PROVIDER_SITE_OTHER): Payer: Self-pay

## 2016-03-28 ENCOUNTER — Encounter: Payer: Self-pay | Admitting: *Deleted

## 2016-03-28 ENCOUNTER — Ambulatory Visit (INDEPENDENT_AMBULATORY_CARE_PROVIDER_SITE_OTHER): Payer: Medicaid Other

## 2016-03-28 VITALS — BP 100/70 | HR 85

## 2016-03-28 DIAGNOSIS — Z1389 Encounter for screening for other disorder: Secondary | ICD-10-CM

## 2016-03-28 DIAGNOSIS — O3680X0 Pregnancy with inconclusive fetal viability, not applicable or unspecified: Secondary | ICD-10-CM | POA: Diagnosis not present

## 2016-03-28 DIAGNOSIS — R87610 Atypical squamous cells of undetermined significance on cytologic smear of cervix (ASC-US): Secondary | ICD-10-CM | POA: Diagnosis not present

## 2016-03-28 DIAGNOSIS — Z23 Encounter for immunization: Secondary | ICD-10-CM | POA: Diagnosis not present

## 2016-03-28 DIAGNOSIS — Z3481 Encounter for supervision of other normal pregnancy, first trimester: Secondary | ICD-10-CM | POA: Diagnosis not present

## 2016-03-28 DIAGNOSIS — Z3A08 8 weeks gestation of pregnancy: Secondary | ICD-10-CM

## 2016-03-28 DIAGNOSIS — Z3682 Encounter for antenatal screening for nuchal translucency: Secondary | ICD-10-CM

## 2016-03-28 DIAGNOSIS — Z3480 Encounter for supervision of other normal pregnancy, unspecified trimester: Secondary | ICD-10-CM | POA: Insufficient documentation

## 2016-03-28 DIAGNOSIS — Z331 Pregnant state, incidental: Secondary | ICD-10-CM | POA: Diagnosis not present

## 2016-03-28 DIAGNOSIS — O4691 Antepartum hemorrhage, unspecified, first trimester: Secondary | ICD-10-CM

## 2016-03-28 LAB — POCT URINALYSIS DIPSTICK
GLUCOSE UA: NEGATIVE
Ketones, UA: NEGATIVE
LEUKOCYTES UA: NEGATIVE
NITRITE UA: NEGATIVE
Protein, UA: NEGATIVE
RBC UA: NEGATIVE

## 2016-03-28 NOTE — Progress Notes (Signed)
Mary Meyer is a 26 y.o. 692P1001 female here today for initial OB intake/educational visit with RN  Patient's medical, surgical, and obstetrical history obtained and reviewed.  Current medications and allergies also reviewed.   Dating ultrasound today revealed GA of [redacted]wk4d based on US. EDC 11/10/16   BP 100/70   Pulse 85   LMP 01/10/2016   Patient Active Problem List   Diagnosis Date Noted  . Encounter for supervision of other normal pregnancy 03/28/2016  . Anxiety 12/15/2015  . Previous cesarean section 12/15/2015  . Gestational edema, postpartum 10/12/2014  . Tachycardia   . Abnormal Pap smear of cervix 05/07/2014   Past Medical History:  Diagnosis Date  . Anxiety   . Asthma   . Tachycardia   . Vaginal Pap smear, abnormal    Past Surgical History:  Procedure Laterality Date  . CARDIAC CATHETERIZATION  2009   states due to tacycardia, had alblation  . CARDIAC CATHETERIZATION  2009  . CESAREAN SECTION N/A 10/03/2014   Procedure: CESAREAN SECTION;  Surgeon: Adam PhenixJames G Arnold, MD;  Location: [facility];  Service: Obstetrics;  Laterality: N/A;   OB History    Gravida Para Term Preterm AB Living   2 1 1     1    SAB TAB Ectopic Multiple Live Births         0 1      She is taking prenatal vitamins CCNC form completed PN1 labs drawn Baby scripts and mychart activated  Reviewed recommended weight gain based on pre-gravid BMI  Genetic Screening discussed First Screen: requested Cystic fibrosis screening discussed declined  Face-to-face time at least 30 minutes. 50% or more of this visit was spent in counseling and coordination of care.  Return in about 4 weeks (around 04/27/2016) for New OB, US;NT+1stIT.   Debbe OdeaLatisha Cresenzo RN-C 03/28/2016 2:16 PM     Attestation of supervision of visit:   Evaluation and management procedures were performed by Jobe MarkerLatisha Cresenzo, RN,  under my direct supervision and collaboration. I have reviewed the Registered Nurse's note and chart,  and I agree with the management and plan.

## 2016-03-28 NOTE — Progress Notes (Addendum)
US 7+4 wks,single IUP w/ys, positive FHT 165 bpm,normal ov's bilat,subchorionic hemorrhage 2.2 x .6 x 1.4 cm,disproportionate gestational sac 16.9 mm=6+4 wks,EDD 11/10/2016

## 2016-03-29 LAB — GC/CHLAMYDIA PROBE AMP
CHLAMYDIA, DNA PROBE: NEGATIVE
Neisseria gonorrhoeae by PCR: NEGATIVE

## 2016-03-30 LAB — URINE CULTURE

## 2016-04-08 ENCOUNTER — Encounter (HOSPITAL_COMMUNITY): Payer: Self-pay | Admitting: Certified Nurse Midwife

## 2016-04-08 ENCOUNTER — Inpatient Hospital Stay (HOSPITAL_COMMUNITY)
Admission: AD | Admit: 2016-04-08 | Discharge: 2016-04-09 | Disposition: A | Discharge status: Home / Self Care | Payer: Medicaid Other | Source: Ambulatory Visit | Attending: Family Medicine | Admitting: Family Medicine

## 2016-04-08 ENCOUNTER — Inpatient Hospital Stay (HOSPITAL_COMMUNITY): Payer: Medicaid Other

## 2016-04-08 DIAGNOSIS — O039 Complete or unspecified spontaneous abortion without complication: Secondary | ICD-10-CM

## 2016-04-08 DIAGNOSIS — Z87891 Personal history of nicotine dependence: Secondary | ICD-10-CM | POA: Diagnosis not present

## 2016-04-08 DIAGNOSIS — Z3A09 9 weeks gestation of pregnancy: Secondary | ICD-10-CM | POA: Diagnosis not present

## 2016-04-08 DIAGNOSIS — O209 Hemorrhage in early pregnancy, unspecified: Secondary | ICD-10-CM | POA: Diagnosis present

## 2016-04-08 DIAGNOSIS — Z3481 Encounter for supervision of other normal pregnancy, first trimester: Secondary | ICD-10-CM

## 2016-04-08 LAB — CBC
HCT: 38.5 % (ref 36.0–46.0)
Hemoglobin: 13.4 g/dL (ref 12.0–15.0)
MCH: 30.9 pg (ref 26.0–34.0)
MCHC: 34.8 g/dL (ref 30.0–36.0)
MCV: 88.9 fL (ref 78.0–100.0)
PLATELETS: 211 10*3/uL (ref 150–400)
RBC: 4.33 MIL/uL (ref 3.87–5.11)
RDW: 12.3 % (ref 11.5–15.5)
WBC: 7.8 10*3/uL (ref 4.0–10.5)

## 2016-04-08 NOTE — MAU Note (Signed)
Patient presents to MAU with vaginal bleeding and some cramping. Patient states she had a big gush at 1845 and has been wiping blood since then.

## 2016-04-08 NOTE — MAU Note (Signed)
Urine sent to lab 

## 2016-04-09 DIAGNOSIS — O039 Complete or unspecified spontaneous abortion without complication: Secondary | ICD-10-CM

## 2016-04-09 LAB — URINALYSIS, ROUTINE W REFLEX MICROSCOPIC
BILIRUBIN URINE: NEGATIVE
GLUCOSE, UA: NEGATIVE mg/dL
KETONES UR: NEGATIVE mg/dL
Leukocytes, UA: NEGATIVE
NITRITE: NEGATIVE
PH: 6 (ref 5.0–8.0)
Protein, ur: NEGATIVE mg/dL
SPECIFIC GRAVITY, URINE: 1.01 (ref 1.005–1.030)

## 2016-04-09 LAB — URINALYSIS, MICROSCOPIC (REFLEX)

## 2016-04-09 MED ORDER — OXYCODONE-ACETAMINOPHEN 5-325 MG PO TABS: 1.0000 | ORAL_TABLET | Freq: Four times a day (QID) | ORAL | 0 refills | Status: DC | PRN

## 2016-04-09 MED ORDER — MISOPROSTOL 200 MCG PO TABS: ORAL_TABLET | ORAL | 0 refills | Status: DC

## 2016-04-09 NOTE — MAU Provider Note (Signed)
Chief Complaint: Vaginal Bleeding and Abdominal Cramping   None     SUBJECTIVE HPI: Mary Meyer is a 26 y.o. G2P1001 at [redacted]w[redacted]d by LMP who presents to maternity admissions reporting onset of bright red vaginal bleeding today with small clots. This is a new symptom.  It is constant and unchanged since onset.  It has no associated symptoms.  She has not tried any treatments.  Nothing makes it better or worse. She denies vaginal itching/burning, urinary symptoms, h/a, dizziness, n/v, or fever/chills.     HPI  Past Medical History:  Diagnosis Date  . Anxiety   . Asthma   . Tachycardia   . Vaginal Pap smear, abnormal    Past Surgical History:  Procedure Laterality Date  . CARDIAC CATHETERIZATION  2009   states due to tacycardia, had alblation  . CARDIAC CATHETERIZATION  2009  . CESAREAN SECTION N/A 10/03/2014   Procedure: CESAREAN SECTION;  Surgeon: Adam Phenix, MD;  Location: [facility];  Service: Obstetrics;  Laterality: N/A;   Social History   Social History  . Marital status: Single    Spouse name: N/A  . Number of children: N/A  . Years of education: N/A   Occupational History  . Not on file.   Social History Main Topics  . Smoking status: Former Smoker    Years: 10.00    Types: Cigarettes  . Smokeless tobacco: Never Used  . Alcohol use No     Comment: occ beer; not now  . Drug use: No  . Sexual activity: Yes    Birth control/ protection: None   Other Topics Concern  . Not on file   Social History Narrative  . No narrative on file   No current facility-administered medications on file prior to encounter.    Current Outpatient Prescriptions on File Prior to Encounter  Medication Sig Dispense Refill  . folic acid (FOLVITE) 1 MG tablet Take 4 mg daily (Patient not taking: Reported on 03/28/2016) 120 tablet 11  . Prenat w/o A-[id number]-[id number]-FA-DHA (CITRANATAL ASSURE) 35-1 & 300 MG tablet One tablet and one capsule daily 60 tablet 11   No Known Allergies  ROS:   Review of Systems  Constitutional: Negative for chills, fatigue and fever.  Respiratory: Negative for shortness of breath.   Cardiovascular: Negative for chest pain.  Genitourinary: Positive for vaginal bleeding. Negative for difficulty urinating, dysuria, flank pain, pelvic pain, vaginal discharge and vaginal pain.  Neurological: Negative for dizziness and headaches.  Psychiatric/Behavioral: Negative.      I have reviewed patient's Past Medical Hx, Surgical Hx, Family Hx, Social Hx, medications and allergies.   Physical Exam   Patient Vitals for the past 24 hrs:  BP Temp Temp src Pulse Resp Height Weight  04/09/16 0024 148/78 97.9 F (36.6 C) Oral 109 16 - -  04/08/16 2124 137/73 97.5 F (36.4 C) Oral 97 16 5\' 4"  (1.626 m) 124 lb (56.2 kg)   Constitutional: Well-developed, well-nourished female in no acute distress.  Cardiovascular: normal rate Respiratory: normal effort GI: Abd soft, non-tender. Pos BS x 4 MS: Extremities nontender, no edema, normal ROM Neurologic: Alert and oriented x 4.  GU: Neg CVAT.  PELVIC EXAM: Deferred Pad count, 1 pad in MAU 1/2 soaked in 1 hour   LAB RESULTS Results for orders placed or performed during the hospital encounter of 04/08/16 (from the past 24 hour(s))  Urinalysis, Routine w reflex microscopic     Status: Abnormal   Collection Time: 04/08/16  9:05  PM  Result Value Ref Range   Color, Urine YELLOW YELLOW   APPearance CLEAR CLEAR   Specific Gravity, Urine 1.010 1.005 - 1.030   pH 6.0 5.0 - 8.0   Glucose, UA NEGATIVE NEGATIVE mg/dL   Hgb urine dipstick LARGE (A) NEGATIVE   Bilirubin Urine NEGATIVE NEGATIVE   Ketones, ur NEGATIVE NEGATIVE mg/dL   Protein, ur NEGATIVE NEGATIVE mg/dL   Nitrite NEGATIVE NEGATIVE   Leukocytes, UA NEGATIVE NEGATIVE  Urinalysis, Microscopic (reflex)     Status: Abnormal   Collection Time: 04/08/16  9:05 PM  Result Value Ref Range   RBC / HPF 6-30 0 - 5 RBC/hpf   WBC, UA 0-5 0 - 5 WBC/hpf   Bacteria,  UA FEW (A) NONE SEEN   Squamous Epithelial / LPF 0-5 (A) NONE SEEN  CBC     Status: None   Collection Time: 04/08/16 11:11 PM  Result Value Ref Range   WBC 7.8 4.0 - 10.5 K/uL   RBC 4.33 3.87 - 5.11 MIL/uL   Hemoglobin 13.4 12.0 - 15.0 g/dL   HCT 16.1 09.6 - 04.5 %   MCV 88.9 78.0 - 100.0 fL   MCH 30.9 26.0 - 34.0 pg   MCHC 34.8 30.0 - 36.0 g/dL   RDW 40.9 81.1 - 91.4 %   Platelets 211 150 - 400 K/uL       IMAGING US Ob Comp Less 14 Wks  Result Date: 03/28/2016 DATING AND VIABILITY SONOGRAM [name] is a 26 y.o. year old G2P1001 with LMP 01/10/2016 which would correlate to  11+[redacted] weeks gestation.  She has regular menstrual cycles.   She is here today for a confirmatory initial sonogram. GESTATION: SINGLETON   FETAL ACTIVITY:          Heart rate         165        CERVIX: Appears closed ADNEXA: The ovaries are normal. GESTATIONAL AGE AND  BIOMETRICS: Gestational criteria: Estimated Date of Delivery: 10/16/2016 by LMP now at 11+1 wks Previous Scans:0 GESTATIONAL SAC           16.9 mm         6+4 weeks CROWN RUMP LENGTH           12.8 mm         7+4 weeks                                                                      AVERAGE EGA(BY THIS SCAN):  7+4 weeks WORKING EDD( early ultrasound ):  11/10/2016  TECHNICIAN COMMENTS: [place] 7+4 wks,single IUP w/ys, positive FHT 165 bpm,normal ov's bilat,subchorionic hemorrhage 2.2 x .6 x 1.4 cm,disproportionate gestational sac 16.9 mm=6+4 wks A copy of this report including all images has been saved and backed up to a second source for retrieval if needed. All measures and details of the anatomical scan, placentation, fluid volume and pelvic anatomy are contained in that report. Amber Flora Lipps 03/28/2016 1:39 PM Clinical Impression and recommendations: I have reviewed the sonogram results above, combined with the patient's current clinical course, below are my impressions and any appropriate recommendations for management based on the sonographic  findings. Viable early IUP G2P1001 Estimated Date of Delivery: 11/10/16  by today's sonogram, discrepant from her LMP Normal general sonographic findings Recommend routine care unless otherwise clinically indicated EURE,LUTHER H 03/28/2016 4:59 PM   [place] Ob Transvaginal  Result Date: 04/08/2016 CLINICAL DATA:  Vaginal bleeding EXAM: TRANSVAGINAL OB ULTRASOUND TECHNIQUE: Transvaginal ultrasound was performed for complete evaluation of the gestation as well as the maternal uterus, adnexal regions, and pelvic cul-de-sac. COMPARISON:  03/28/2016 FINDINGS: Intrauterine gestational sac: Present Yolk sac:  Absent Embryo:  Presents Cardiac Activity: Absent Subchorionic hemorrhage:  None visualized. Maternal uterus/adnexae: Within normal limits. No free fluid is noted. IMPRESSION: Intrauterine gestation is noted without cardiac activity consistent with fetal demise. Electronically Signed   By: Alcide CleverMark  Lukens M.D.   On: 04/08/2016 22:57    MAU Management/MDM: Ordered labs and US and reviewed results.  Today's US definitive for failed pregnancy. Offered expectant management, Cytotec, or D&C. Discussed risks/benefits of each.  Pt is undecided but prefers an Rx for Cytotec.  Cytotec 800 mcg buccally prescribed x 1 dose plus Percocet 5/325, take 1-2 Q 6 hours PRN x 10 tabs.  Bleeding precautions/reasons to return to MAU reviewed. Pt to follow up at [facility] in 1-2 weeks.  Pt stable at time of discharge.  ASSESSMENT 1. SAB (spontaneous abortion)   2. Vaginal bleeding in pregnancy, first trimester     PLAN Discharge home Allergies as of 04/09/2016   No Known Allergies     Medication List    STOP taking these medications   divalproex 125 MG DR tablet Commonly known as:  DEPAKOTE   divalproex 500 MG DR tablet Commonly known as:  DEPAKOTE   escitalopram 20 MG tablet Commonly known as:  LEXAPRO     TAKE these medications   CITRANATAL ASSURE 35-1 & 300 MG tablet One tablet and one capsule daily   folic acid 1 MG  tablet Commonly known as:  FOLVITE Take 4 mg daily   misoprostol 200 MCG tablet Commonly known as:  CYTOTEC Place all 4 tablets into your cheek and let them dissolve before swallowing them.   oxyCODONE-acetaminophen 5-325 MG tablet Commonly known as:  PERCOCET/ROXICET Take 1-2 tablets by mouth every 6 (six) hours as needed.      Follow-up Information    [facility] Follow up.   Why:  Call to make follow up appointment in 1-2 weeks. Return to MAU as needed for emergencies. Contact information: [address] [place] [id number] [phone]          Sharen CounterLisa Leftwich-Kirby Certified Nurse-Midwife 04/09/2016  4:37 AM

## 2016-04-09 NOTE — Discharge Instructions (Signed)

## 2016-04-12 ENCOUNTER — Telehealth: Payer: Self-pay | Admitting: Obstetrics & Gynecology

## 2016-04-12 NOTE — Telephone Encounter (Signed)
Patient called with complaints of heavy bleeding, clots and severe abdominal cramping. She was diagnosed with a miscarriage on Sunday. I informed patient that she could be passing the miscarriage now since she is having increased cramping and bleeding. Encouraged patient to continue taking Percocet along with Ibuprofen as needed for the pain. Advised patient to call us back tomorrow if bleeding does not lighten or cramping is worse. Pt verbalized understanding.

## 2016-04-12 NOTE — Telephone Encounter (Signed)
Pt called stating that she would like to speak with a nurse, Pt did not state the reason why. Please contact pt °

## 2016-04-16 ENCOUNTER — Encounter: Payer: Self-pay | Admitting: Women's Health

## 2016-04-16 ENCOUNTER — Ambulatory Visit (INDEPENDENT_AMBULATORY_CARE_PROVIDER_SITE_OTHER): Payer: Medicaid Other | Admitting: Women's Health

## 2016-04-16 VITALS — BP 140/80 | HR 72 | Ht 64.0 in | Wt 125.0 lb

## 2016-04-16 DIAGNOSIS — O039 Complete or unspecified spontaneous abortion without complication: Secondary | ICD-10-CM | POA: Diagnosis not present

## 2016-04-16 DIAGNOSIS — R030 Elevated blood-pressure reading, without diagnosis of hypertension: Secondary | ICD-10-CM | POA: Diagnosis not present

## 2016-04-16 DIAGNOSIS — Z3009 Encounter for other general counseling and advice on contraception: Secondary | ICD-10-CM

## 2016-04-16 HISTORY — DX: Complete or unspecified spontaneous abortion without complication: O03.9

## 2016-04-16 MED ORDER — MEDROXYPROGESTERONE ACETATE 150 MG/ML IM SUSP: 150.0000 mg | INTRAMUSCULAR | 3 refills | Status: DC

## 2016-04-16 NOTE — Patient Instructions (Signed)
Come to lab Wednesday morning for pregnancy level bloodwork (they up at 8am) NO SEX UNTIL AFTER YOU GET YOUR BIRTH CONTROL
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
## 2016-04-17 LAB — BETA HCG QUANT (REF LAB): HCG QUANT: 167 m[IU]/mL

## 2016-04-18 ENCOUNTER — Other Ambulatory Visit: Payer: Medicaid Other

## 2016-04-18 ENCOUNTER — Ambulatory Visit: Payer: Medicaid Other

## 2016-04-26 ENCOUNTER — Other Ambulatory Visit: Payer: Medicaid Other

## 2016-04-26 ENCOUNTER — Other Ambulatory Visit: Payer: Self-pay | Admitting: Women's Health

## 2016-04-26 ENCOUNTER — Ambulatory Visit (INDEPENDENT_AMBULATORY_CARE_PROVIDER_SITE_OTHER): Payer: Medicaid Other | Admitting: *Deleted

## 2016-04-26 VITALS — BP 100/62

## 2016-04-26 DIAGNOSIS — Z0130 Encounter for examination of blood pressure without abnormal findings: Secondary | ICD-10-CM | POA: Diagnosis not present

## 2016-04-26 DIAGNOSIS — Z3042 Encounter for surveillance of injectable contraceptive: Secondary | ICD-10-CM

## 2016-04-26 DIAGNOSIS — Z308 Encounter for other contraceptive management: Secondary | ICD-10-CM

## 2016-04-26 MED ORDER — NORETHIN-ETH ESTRAD-FE BIPHAS 1 MG-10 MCG / 10 MCG PO TABS: 1.0000 | ORAL_TABLET | Freq: Every day | ORAL | 3 refills | Status: DC

## 2016-04-26 NOTE — Progress Notes (Signed)
Pt here for depo w/ RN, had discussed if bp was normal would do coc's instead. BP normal today. Rx Lo loestrin 3pk w/ 3RF. F/u in 3mths for coc f/u.  Cheral MarkerKimberly R. Booker, CNM, WHNP-BC 04/26/2016 3:30 PM

## 2016-04-27 LAB — BETA HCG QUANT (REF LAB): HCG QUANT: 6 m[IU]/mL

## 2016-04-30 ENCOUNTER — Other Ambulatory Visit: Payer: Medicaid Other

## 2016-04-30 ENCOUNTER — Encounter: Payer: Medicaid Other | Admitting: Women's Health

## 2016-08-02 ENCOUNTER — Encounter: Payer: Self-pay | Admitting: Women's Health

## 2016-08-02 ENCOUNTER — Ambulatory Visit (INDEPENDENT_AMBULATORY_CARE_PROVIDER_SITE_OTHER): Payer: Medicaid Other | Admitting: Women's Health

## 2016-08-02 VITALS — BP 116/78 | HR 84 | Wt 128.0 lb

## 2016-08-02 DIAGNOSIS — Z30016 Encounter for initial prescription of transdermal patch hormonal contraceptive device: Secondary | ICD-10-CM

## 2016-08-02 MED ORDER — NORELGESTROMIN-ETH ESTRADIOL 150-35 MCG/24HR TD PTWK: MEDICATED_PATCH | TRANSDERMAL | 3 refills | Status: DC

## 2016-08-02 NOTE — Patient Instructions (Signed)

## 2016-08-02 NOTE — Progress Notes (Signed)
[facility] Visit  Patient name: Mary Meyer MRN [id number]  Date of birth: 11-10-1990  CC & HPI:  Mary Meyer is a 26 y.o. G41P1011 Caucasian female presenting today for coc f/u. Was rx'd LoLoestrin 04/26/16. Having frequent irregular periods and weight gain. Discussed all options. She wants to try patch. Possibly interested in IUD.  Patient's last menstrual period was 01/10/2016. Last pap 11/2015 ASCUS w/ +HRHPV, colpo bx neg, due for pap Oct 2018  Pertinent History Reviewed:  Medical & Surgical Hx:   Past medical, surgical, family, and social history reviewed in electronic medical record Medications: Reviewed & Updated - see associated section Allergies: Reviewed in electronic medical record  Objective Findings:  Vitals: BP 116/78 (BP Location: Right Arm, Patient Position: Sitting, Cuff Size: Normal)   Pulse 84   Wt 128 lb (58.1 kg)   LMP 01/10/2016   BMI 21.97 kg/m  Body mass index is 21.97 kg/m.  Physical Examination: General appearance - alert, well appearing, and in no distress  No results found for this or any previous visit (from the past 24 hour(s)).   Assessment & Plan:  A:   Contraception management  P:  Stop LoLoestrin after finished w/ current pack  Rx ortho evra patch, place 1 patch weekly x 3wks, leave off 4th week for period, #9patches w/ 3RF  Gave IUD pamphlet  Return for after 10/19 for , Pap & physical.  Marge Duncans CNM, [facility] 08/02/2016 3:06 PM

## 2016-12-25 ENCOUNTER — Encounter: Payer: Self-pay | Admitting: Women's Health

## 2016-12-25 ENCOUNTER — Ambulatory Visit (INDEPENDENT_AMBULATORY_CARE_PROVIDER_SITE_OTHER): Payer: Medicaid Other | Admitting: Women's Health

## 2016-12-25 VITALS — BP 120/66 | HR 88 | Wt 134.0 lb

## 2016-12-25 DIAGNOSIS — N3000 Acute cystitis without hematuria: Secondary | ICD-10-CM | POA: Diagnosis not present

## 2016-12-25 DIAGNOSIS — N898 Other specified noninflammatory disorders of vagina: Secondary | ICD-10-CM | POA: Diagnosis not present

## 2016-12-25 DIAGNOSIS — R300 Dysuria: Secondary | ICD-10-CM | POA: Diagnosis not present

## 2016-12-25 DIAGNOSIS — Z3202 Encounter for pregnancy test, result negative: Secondary | ICD-10-CM | POA: Diagnosis not present

## 2016-12-25 LAB — POCT WET PREP (WET MOUNT)
Clue Cells Wet Prep Whiff POC: NEGATIVE
Trichomonas Wet Prep HPF POC: ABSENT

## 2016-12-25 LAB — POCT URINALYSIS DIPSTICK
Blood, UA: NEGATIVE
Glucose, UA: NEGATIVE
Ketones, UA: POSITIVE
NITRITE UA: POSITIVE
PROTEIN UA: NEGATIVE

## 2016-12-25 LAB — POCT URINE PREGNANCY: Preg Test, Ur: NEGATIVE

## 2016-12-25 MED ORDER — SULFAMETHOXAZOLE-TRIMETHOPRIM 800-160 MG PO TABS: 1.0000 | ORAL_TABLET | Freq: Two times a day (BID) | ORAL | 0 refills | Status: DC

## 2016-12-25 NOTE — Progress Notes (Signed)
GYN VISIT Patient name: Mary Meyer MRN [id number]  Date of birth: [birth year] Chief Complaint:   Vaginal Irritation and Urinary Tract Infection  History of Present Illness:   Mary Meyer is a 26 y.o. 642P1011 Caucasian female being seen today for report of foul vaginal odor since June. Denies itching/irritation, abnormal discharge. Also reports difficulty urinating- has urge to go, but not much comes out. Increased urinary frequency, no dysuria. Using ortho-evra patches, come off easily if puts on abd, not so much if puts on buttocks.   Patient's last menstrual period was 11/27/2016. The current method of family planning is Ortho-Evra patches weekly. Last pap 12/15/15. Results were:  ASCUS w/ +HRHPV, colpo neg Review of Systems:   Pertinent items are noted in HPI Denies fever/chills, dizziness, headaches, visual disturbances, fatigue, shortness of breath, chest pain, abdominal pain, vomiting, abnormal vaginal discharge/itching/odor/irritation, problems with periods, bowel movements, urination, or intercourse unless otherwise stated above.  Pertinent History Reviewed:  Reviewed past medical,surgical, social, obstetrical and family history.  Reviewed problem list, medications and allergies. Physical Assessment:   Vitals:   12/25/16 1508  BP: 120/66  Pulse: 88  Weight: 134 lb (60.8 kg)  Body mass index is 23 kg/m.       Physical Examination:   General appearance: alert, well appearing, and in no distress  Mental status: alert, oriented to person, place, and time  Skin: warm & dry   Cardiovascular: normal heart rate noted   Respiratory: normal respiratory effort, no distress   Abdomen: soft, non-tender   Pelvic: VULVA: normal appearing vulva with no masses, tenderness or lesions, VAGINA: normal appearing vagina with normal color and non-odorous discharge, no lesions, CERVIX: normal appearing cervix without discharge or lesions. I let pt smell discharge- and she said  she agrees, does not smell  Extremities: no edema   Results for orders placed or performed in visit on 12/25/16 (from the past 24 hour(s))  POCT Urinalysis Dipstick   Collection Time: 12/25/16  3:11 PM  Result Value Ref Range   Color, UA     Clarity, UA     Glucose, UA negative    Bilirubin, UA     Ketones, UA positive    Spec Grav, UA  1.010 - 1.025   Blood, UA negative    pH, UA  5.0 - 8.0   Protein, UA negative    Urobilinogen, UA  0.2 or 1.0 E.U./dL   Nitrite, UA positive    Leukocytes, UA Large (3+) (A) Negative  POCT urine pregnancy   Collection Time: 12/25/16  3:13 PM  Result Value Ref Range   Preg Test, Ur Negative Negative  POCT Wet Prep Mellody Drown([place])   Collection Time: 12/25/16  3:33 PM  Result Value Ref Range   Source Wet Prep POC vaginal    WBC, Wet Prep HPF POC none    Bacteria Wet Prep HPF POC None (A) Few   BACTERIA WET PREP MORPHOLOGY POC     Clue Cells Wet Prep HPF POC None None   Clue Cells Wet Prep Whiff POC Negative Whiff    Yeast Wet Prep HPF POC None    KOH Wet Prep POC     Trichomonas Wet Prep HPF POC Absent Absent    Assessment & Plan:  1) UTI> rx bactrim ds bid x 7d, send urine cx  2) Perceived vaginal odor> was likely malodorous urine from uti  3) Ortho evra patches come off> to wear on buttocks, as she reports  they don't come off as easily there  Orders Placed This Encounter  Procedures  . Urine Culture  . GC/Chlamydia Probe Amp  . POCT urine pregnancy  . POCT Urinalysis Dipstick  . POCT Wet Prep [facility])    Return for As scheduled. Nov for pap & physical  Marge Duncans CNM, [facility] 12/25/2016 3:38 PM

## 2016-12-27 LAB — GC/CHLAMYDIA PROBE AMP
CHLAMYDIA, DNA PROBE: NEGATIVE
NEISSERIA GONORRHOEAE BY PCR: NEGATIVE

## 2016-12-27 LAB — URINE CULTURE

## 2016-12-28 ENCOUNTER — Telehealth: Payer: Self-pay | Admitting: *Deleted

## 2016-12-28 NOTE — Telephone Encounter (Signed)
LMOVM that Mary BattenKim rx'd bactrim at visit for uti, urine cx shows +UTI but did not give list of antibiotics bacteria was susceptible to, so advised to let us know if sx not improved after she finishes bactrim.

## 2016-12-31 ENCOUNTER — Telehealth: Payer: Self-pay | Admitting: *Deleted

## 2016-12-31 MED ORDER — NORELGESTROMIN-ETH ESTRADIOL 150-35 MCG/24HR TD PTWK: MEDICATED_PATCH | TRANSDERMAL | 3 refills | Status: DC

## 2016-12-31 NOTE — Telephone Encounter (Signed)
[facility]

## 2017-01-10 ENCOUNTER — Telehealth: Payer: Self-pay | Admitting: *Deleted

## 2017-01-10 NOTE — Telephone Encounter (Signed)
Pt advised to come by office tomorrow and leave a urine for culture. JSY

## 2017-01-10 NOTE — Telephone Encounter (Signed)
Spoke with pt. Pt finished an antibiotic for UTI on 11/6. She started noticing a foul odor with urine again the end of last week. No other symptoms. What do you advise? Thanks!! JSY

## 2017-01-11 ENCOUNTER — Other Ambulatory Visit: Payer: Medicaid Other

## 2017-01-11 DIAGNOSIS — Z87440 Personal history of urinary (tract) infections: Secondary | ICD-10-CM

## 2017-01-13 LAB — URINE CULTURE: Organism ID, Bacteria: NO GROWTH

## 2017-01-14 ENCOUNTER — Telehealth: Payer: Self-pay | Admitting: *Deleted

## 2017-01-14 NOTE — Telephone Encounter (Signed)
LMOVM that urine culture neg. If she is still having odor, she needs to come in for wet prep.

## 2017-01-23 ENCOUNTER — Other Ambulatory Visit (HOSPITAL_COMMUNITY)
Admission: RE | Admit: 2017-01-23 | Discharge: 2017-01-23 | Disposition: A | Discharge status: Home / Self Care | Payer: Medicaid Other | Source: Ambulatory Visit | Attending: Obstetrics & Gynecology | Admitting: Obstetrics & Gynecology

## 2017-01-23 ENCOUNTER — Encounter: Payer: Self-pay | Admitting: Women's Health

## 2017-01-23 ENCOUNTER — Ambulatory Visit: Payer: Medicaid Other | Admitting: Women's Health

## 2017-01-23 VITALS — BP 120/80 | HR 96 | Ht 63.0 in | Wt 133.3 lb

## 2017-01-23 DIAGNOSIS — F418 Other specified anxiety disorders: Secondary | ICD-10-CM

## 2017-01-23 DIAGNOSIS — N898 Other specified noninflammatory disorders of vagina: Secondary | ICD-10-CM

## 2017-01-23 DIAGNOSIS — R3911 Hesitancy of micturition: Secondary | ICD-10-CM

## 2017-01-23 DIAGNOSIS — N760 Acute vaginitis: Secondary | ICD-10-CM

## 2017-01-23 DIAGNOSIS — Z01419 Encounter for gynecological examination (general) (routine) without abnormal findings: Secondary | ICD-10-CM | POA: Diagnosis not present

## 2017-01-23 DIAGNOSIS — Z3202 Encounter for pregnancy test, result negative: Secondary | ICD-10-CM | POA: Diagnosis not present

## 2017-01-23 DIAGNOSIS — R6882 Decreased libido: Secondary | ICD-10-CM

## 2017-01-23 DIAGNOSIS — N926 Irregular menstruation, unspecified: Secondary | ICD-10-CM

## 2017-01-23 DIAGNOSIS — Z0000 Encounter for general adult medical examination without abnormal findings: Secondary | ICD-10-CM

## 2017-01-23 DIAGNOSIS — R232 Flushing: Secondary | ICD-10-CM

## 2017-01-23 DIAGNOSIS — L739 Follicular disorder, unspecified: Secondary | ICD-10-CM

## 2017-01-23 DIAGNOSIS — B9689 Other specified bacterial agents as the cause of diseases classified elsewhere: Secondary | ICD-10-CM

## 2017-01-23 LAB — POCT URINALYSIS DIPSTICK
Glucose, UA: NEGATIVE
KETONES UA: NEGATIVE
LEUKOCYTES UA: NEGATIVE
Nitrite, UA: NEGATIVE
PROTEIN UA: NEGATIVE
RBC UA: NEGATIVE

## 2017-01-23 LAB — POCT WET PREP (WET MOUNT)
CLUE CELLS WET PREP WHIFF POC: POSITIVE
TRICHOMONAS WET PREP HPF POC: ABSENT

## 2017-01-23 LAB — POCT URINE PREGNANCY: Preg Test, Ur: NEGATIVE

## 2017-01-23 MED ORDER — METRONIDAZOLE 0.75 % VA GEL: 1.0000 | Freq: Every day | VAGINAL | 0 refills | Status: DC

## 2017-01-23 NOTE — Progress Notes (Signed)
WELL-WOMAN EXAMINATION Patient name: Mary Meyer MRN [id number]  Date of birth: 05-Jan-1991 Chief Complaint:   Gynecologic Exam (pap/physcial/ no period since September 2018)  History of Present Illness:   Mary Meyer is a 26 y.o. G28P1011 Caucasian female being seen today for a routine well-woman exam.  Current complaints: reports no period since Sept, however when I saw her 10/30 she had reported LMP as 10/2, so really no period so far in Nov. Is on 3rd week of patch, getting ready to remove it for 1wk. Doesn't feel pregnant, took 2 HPT that were neg. Has had some stress.  Does still report vaginal odor, takes shower in am and feels midday that she just needs another shower d/t odor. Denies itching/irritation/discharge. GC/CT neg 10/30.  Feels like she doesn't completely empty bladder w/ voiding. Recent uti 10/30 w/ + urine cx, completed antibiotics, called back thinking it was still there but had neg repeat urine cx 11/16.  Decreased sex drive/desire and discomfort w/ sex. Not sleeping well. Does still find joy in things she used- actually beginning to find more joy. Stays in house all day w/ child b/c family member who usually watches him so she can work is out after having surgery for awhile, so she has been unable to work/get out of house. Denies SI/HI. Does have dep/anx- is on zoloft, vistaril, depakote and feels she is much better than she used to be. Sees [facility] monthly now instead of weekly. They are aware of all complaints- and are managing. She feels she is at a good place right now with her medicines. PHQ9 today 16.   Some hot flashes, wants to get TSH checked.  Also has bump on Rt mons x ~10month, hurts some. No drainage or s/s infection.   PCP: none      does desire labs- TSH only Patient's last menstrual period was 11/14/2016. The current method of family planning is Ortho-Evra patches weekly Last pap 12/15/15. Results were: ASCUS w/ +HRHPV Last mammogram:  never. Results were: n/a Last colonoscopy: never. Results were: n/a  Review of Systems:   Pertinent items are noted in HPI Denies any headaches, blurred vision, shortness of breath, chest pain, abdominal pain, abnormal vaginal discharge/itching/irritation, problems with bowel movements or intercourse unless otherwise stated above. Pertinent History Reviewed:  Reviewed past medical,surgical, social and family history.  Reviewed problem list, medications and allergies. Physical Assessment:   Vitals:   01/23/17 1125  BP: 120/80  Pulse: 96  Weight: 133 lb 4.8 oz (60.5 kg)  Height: 5\' 3"  (1.6 m)  Body mass index is 23.61 kg/m.        Physical Examination:   General appearance - well appearing, and in no distress  Mental status - alert, oriented to person, place, and time  Psych:  She has a normal mood and affect  Skin - warm and dry, normal color, no suspicious lesions noted  Chest - effort normal, all lung fields clear to auscultation bilaterally  Heart - normal rate and regular rhythm  Neck:  midline trachea, no thyromegaly or nodules  Breasts - breasts appear normal, no suspicious masses, no skin or nipple changes or  axillary nodes  Abdomen - soft, nontender, nondistended, no masses or organomegaly  Pelvic - VULVA: Small almost flush slightly erythematous area Rt mons, slightly uncomfortable to touch- does not appear to be boil- looks more like ingrown hair, no s/s infection.   VAGINA: normal appearing vagina with normal color and small amt thin  white slightly malodorous discharge, no lesions  CERVIX: normal appearing cervix without discharge or lesions, no CMT  Thin prep pap is done w/ reflex HR HPV cotesting  UTERUS: uterus is felt to be normal size, shape, consistency and nontender   ADNEXA: No adnexal masses or tenderness noted.  Extremities:  No swelling or varicosities noted  Results for orders placed or performed in visit on 01/23/17 (from the past 24 hour(s))  POCT Wet Prep  Mellody Drown [place])   Collection Time: 01/23/17  1:36 PM  Result Value Ref Range   Source Wet Prep POC vaginal    WBC, Wet Prep HPF POC few    Bacteria Wet Prep HPF POC None (A) Few   BACTERIA WET PREP MORPHOLOGY POC     Clue Cells Wet Prep HPF POC Few (A) None   Clue Cells Wet Prep Whiff POC Positive Whiff    Yeast Wet Prep HPF POC None    KOH Wet Prep POC     Trichomonas Wet Prep HPF POC Absent Absent  POCT urine pregnancy   Collection Time: 01/23/17  1:42 PM  Result Value Ref Range   Preg Test, Ur Negative Negative  POCT urinalysis dipstick   Collection Time: 01/23/17  1:46 PM  Result Value Ref Range   Color, UA     Clarity, UA     Glucose, UA neg    Bilirubin, UA     Ketones, UA neg    Spec Grav, UA  1.010 - 1.025   Blood, UA neg    pH, UA  5.0 - 8.0   Protein, UA neg    Urobilinogen, UA  0.2 or 1.0 E.U./dL   Nitrite, UA neg    Leukocytes, UA Negative Negative   Assessment & Plan:  1) Well-Woman Exam  2) Mild BV> rx metrogel q hs x 5nights, no sex or etoh while using  3) Decreased libido> discussed can be related to antidepressants as well as not sleeping well/fatigue, caring for kids, etc. Can try scheduling times to have sex to see if helps  4) Dep/anx> managed by [facility], continue monthly visits  5) Hot flashes> will check TSH  6) Folliculitis Rt mons> warm compresses  7) No period in Nov> has had some stress, is getting ready to remove CHC patch, if still doesn't have period, repeat HPT  Labs/procedures today: pap, TSH, ua dipstick, UPT, wet prep  Mammogram @27yo  or sooner if problems Colonoscopy @27yo  or sooner if problems  Orders Placed This Encounter  Procedures  . TSH  . POCT urine pregnancy  . POCT Wet Prep [employer])  . POCT urinalysis dipstick    Follow-up: Return in about 1 year (around 01/23/2018) for Physical.  Marge Duncans CNM, [facility] 01/23/2017 1:46 PM

## 2017-01-24 LAB — CYTOLOGY - PAP: Diagnosis: NEGATIVE

## 2017-01-24 LAB — TSH: TSH: 1.19 u[IU]/mL (ref 0.450–4.500)

## 2017-01-31 ENCOUNTER — Telehealth: Payer: Self-pay | Admitting: Women's Health

## 2017-01-31 NOTE — Telephone Encounter (Signed)
Pt called requesting results of TSH and PAP done on 11/28. DOB verified. Informed pt that both were normal. Pt verbalized understanding.

## 2017-02-14 ENCOUNTER — Ambulatory Visit: Payer: Medicaid Other | Admitting: Women's Health

## 2017-02-14 VITALS — BP 110/74 | HR 78 | Ht 63.0 in | Wt 134.6 lb

## 2017-02-14 DIAGNOSIS — N3289 Other specified disorders of bladder: Secondary | ICD-10-CM

## 2017-02-14 DIAGNOSIS — N39498 Other specified urinary incontinence: Secondary | ICD-10-CM

## 2017-02-14 LAB — POCT URINALYSIS DIPSTICK
Blood, UA: NEGATIVE
Glucose, UA: NEGATIVE
KETONES UA: NEGATIVE
LEUKOCYTES UA: NEGATIVE
NITRITE UA: NEGATIVE
PROTEIN UA: NEGATIVE

## 2017-02-14 MED ORDER — URIBEL 118 MG PO CAPS: ORAL_CAPSULE | ORAL | 0 refills | Status: DC

## 2017-02-14 NOTE — Progress Notes (Signed)
GYN VISIT Patient name: Mary Meyer MRN [id number]  Date of birth: [birth year] Chief Complaint:   Follow-up (vaginal odor)  History of Present Illness:   Mary Meyer is a 26 y.o. G77P1011 Caucasian female being seen today for continued vaginal odor. Husband who is present, states it smells more like urine. Pt states underwear are wet throughout the day, smells strong.  Does not feel likes she is leaking urine. Denies leakage w/ jumping/coughing/sneezing or w/ urge to urinate. States it feels like the same sensation when she is bleeding w/ period. Was treated for mild BV w/ metrogel 11/28.     Patient's last menstrual period was 01/28/2017. The current method of family planning is Ortho-Evra patches weekly. Last pap 12/15/15. Results were:  ASCUS w/ +HRHPV Review of Systems:   Pertinent items are noted in HPI Denies fever/chills, dizziness, headaches, visual disturbances, fatigue, shortness of breath, chest pain, abdominal pain, vomiting, abnormal vaginal discharge/itching/odor/irritation, problems with periods, bowel movements, urination, or intercourse unless otherwise stated above.  Pertinent History Reviewed:  Reviewed past medical,surgical, social, obstetrical and family history.  Reviewed problem list, medications and allergies. Physical Assessment:   Vitals:   02/14/17 1614  BP: 110/74  Pulse: 78  Weight: 134 lb 9.6 oz (61.1 kg)  Height: 5\' 3"  (1.6 m)  Body mass index is 23.84 kg/m.       Physical Examination:   General appearance: alert, well appearing, and in no distress  Mental status: alert, oriented to person, place, and time  Skin: warm & dry   Cardiovascular: normal heart rate noted  Respiratory: normal respiratory effort, no distress  Abdomen: soft, non-tender   Pelvic: VULVA: normal appearing vulva with no masses, tenderness or lesions, VAGINA: normal appearing vagina with normal color and discharge, no lesions, no cystocele noted. CERVIX: normal  appearing cervix without discharge or lesions, UTERUS: uterus is normal size, shape, consistency and nontender, ADNEXA: normal adnexa in size, nontender and no masses,+ pain w/ palpation of anterior vaginal wall/bladder area, pt states it feels like this w/ sex too  Extremities: no edema    Pt has her underwear w/ her, they do indeed smell like urine/ammonia  Results for orders placed or performed in visit on 02/14/17 (from the past 24 hour(s))  POCT Urinalysis Dipstick   Collection Time: 02/14/17  4:18 PM  Result Value Ref Range   Color, UA     Clarity, UA     Glucose, UA Negative    Bilirubin, UA     Ketones, UA Negative    Spec Grav, UA  1.010 - 1.025   Blood, UA Negative    pH, UA  5.0 - 8.0   Protein, UA Negative    Urobilinogen, UA  0.2 or 1.0 E.U./dL   Nitrite, UA Negative    Leukocytes, UA Negative Negative   Appearance     Odor      Assessment & Plan:  1) Urinary incontinence/bladder irritation> kegels 100/day, avoid bladder irritants- caffeine/carbonation/chocolate/spicy stuff/alcohol, rx uribel TID x 14d- coupon given  Orders Placed This Encounter  Procedures  . POCT Urinalysis Dipstick    Return in about 2 weeks (around 02/28/2017) for F/U.  Marge Duncans CNM, [facility] 02/14/2017 5:03 PM

## 2017-02-14 NOTE — Patient Instructions (Signed)
  Cut out caffeine, carbonation, chocolate, spicy foods, alcohol  Kegel Exercises (100 per day) Kegel exercises help strengthen the muscles that support the rectum, vagina, small intestine, bladder, and uterus. Doing Kegel exercises can help:  Improve bladder and bowel control.  Improve sexual response.  Reduce problems and discomfort during pregnancy.  Kegel exercises involve squeezing your pelvic floor muscles, which are the same muscles you squeeze when you try to stop the flow of urine. The exercises can be done while sitting, standing, or lying down, but it is best to vary your position. Phase 1 exercises 1. Squeeze your pelvic floor muscles tight. You should feel a tight lift in your rectal area. If you are a female, you should also feel a tightness in your vaginal area. Keep your stomach, buttocks, and legs relaxed. 2. Hold the muscles tight for up to 10 seconds. 3. Relax your muscles. Repeat this exercise 50 times a day or as many times as told by your health care provider. Continue to do this exercise for at least 4-6 weeks or for as long as told by your health care provider. This information is not intended to replace advice given to you by your health care provider. Make sure you discuss any questions you have with your health care provider. Document Released: 01/30/2012 Document Revised: 10/08/2015 Document Reviewed: 01/02/2015 Elsevier Interactive Patient Education  [employer]2018 Elsevier Inc.

## 2017-03-05 ENCOUNTER — Ambulatory Visit: Payer: Medicaid Other | Admitting: Women's Health

## 2017-03-08 IMAGING — CR DG ABDOMEN 1V
1 series · 1 of 1 positions shown · non-contrast
Comparison: None.

CLINICAL DATA: Status post emergency C-section. Evaluate for
foreign body.

EXAM:
ABDOMEN - 1 VIEW

[view not recorded]
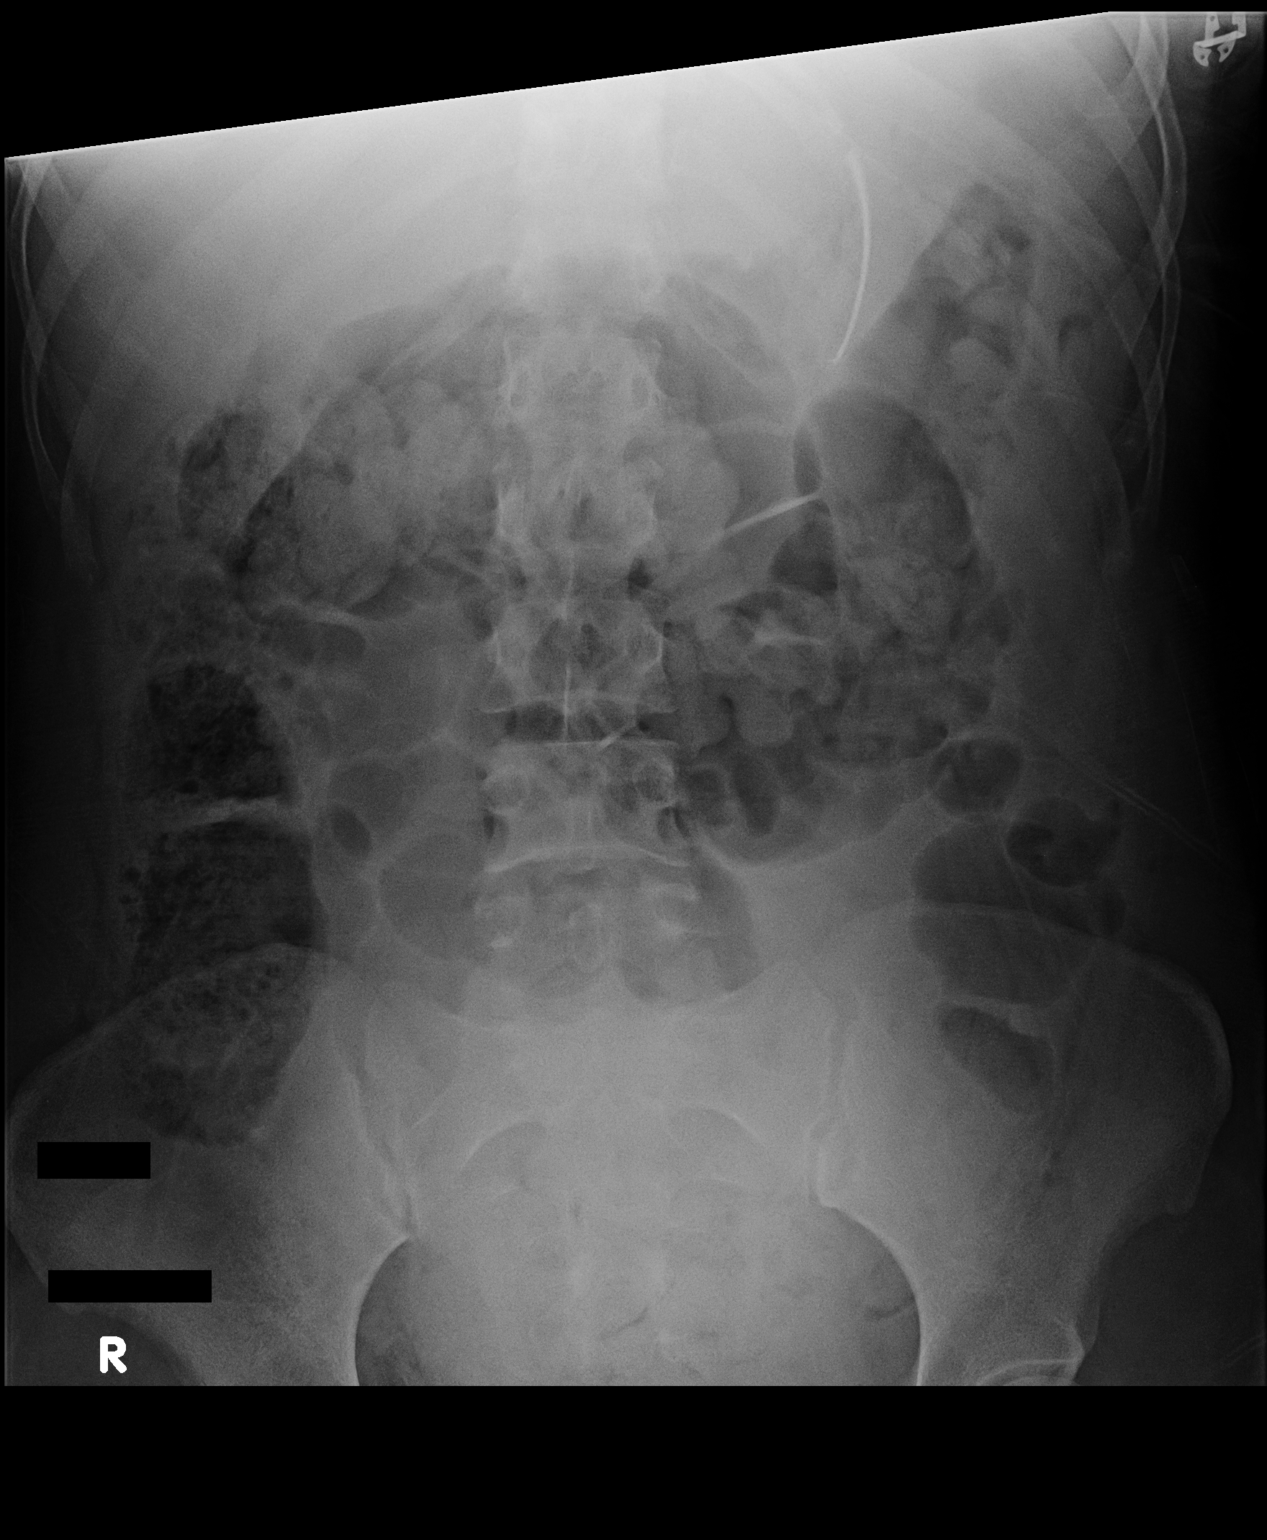

[1 of 1 positions shown; findings below may reference images not displayed]

FINDINGS: There is a nasogastric tube in the stomach. There is no radiopaque
foreign body in the abdomen or pelvis. Generous volume colonic stool
noted.
IMPRESSION: Negative for radiopaque foreign body.

## 2017-03-22 ENCOUNTER — Encounter: Payer: Self-pay | Admitting: Women's Health

## 2017-03-22 ENCOUNTER — Ambulatory Visit: Payer: Medicaid Other | Admitting: Women's Health

## 2017-03-22 VITALS — BP 128/86 | HR 84 | Ht 63.0 in | Wt 140.0 lb

## 2017-03-22 DIAGNOSIS — R61 Generalized hyperhidrosis: Secondary | ICD-10-CM | POA: Diagnosis not present

## 2017-03-22 DIAGNOSIS — R32 Unspecified urinary incontinence: Secondary | ICD-10-CM | POA: Diagnosis not present

## 2017-03-22 DIAGNOSIS — T730XXA Starvation, initial encounter: Secondary | ICD-10-CM

## 2017-03-22 MED ORDER — OXYBUTYNIN CHLORIDE 5 MG PO TABS: 5.0000 mg | ORAL_TABLET | Freq: Three times a day (TID) | ORAL | 1 refills | Status: DC

## 2017-03-22 NOTE — Progress Notes (Signed)
GYN VISIT Patient name: Mary Meyer MRN [id number]  Date of birth: 10-05-1990 Chief Complaint:   Follow-up (medication)  History of Present Illness:   Mary Meyer is a 27 y.o. G19P1011 Caucasian female being seen today for f/u after trial of uribel for urinary incontinence/bladder irritation visit on 02/14/17. States she has noticed no difference at all. Is affecting daily life. Is self-conscious about smell. Urine leaks randomly, not w/ urge or stress. Does plan to have at least 1 more child. Also reports waking up in middle of night w/ sweats and is soaking wet all over; excessive hunger- wants to be checked for hypoglycemia.      Patient's last menstrual period was 03/05/2017. The current method of family planning is Ortho-Evra patches weekly. Last pap 01/23/17. Results were:  normal Review of Systems:   Pertinent items are noted in HPI Denies fever/chills, dizziness, headaches, visual disturbances, fatigue, shortness of breath, chest pain, abdominal pain, vomiting, abnormal vaginal discharge/itching/odor/irritation, problems with periods, bowel movements, urination, or intercourse unless otherwise stated above.  Pertinent History Reviewed:  Reviewed past medical,surgical, social, obstetrical and family history.  Reviewed problem list, medications and allergies. Physical Assessment:   Vitals:   03/22/17 0940  BP: 128/86  Pulse: 84  Weight: 140 lb (63.5 kg)  Height: 5\' 3"  (1.6 m)  Body mass index is 24.8 kg/m.       Physical Examination:   General appearance: alert, well appearing, and in no distress  Mental status: alert, oriented to person, place, and time  Skin: warm & dry   Cardiovascular: normal heart rate noted  Respiratory: normal respiratory effort, no distress  Abdomen: soft, non-tender   Pelvic: examination not indicated  Extremities: no edema   No results found for this or any previous visit (from the past 24 hour(s)).  Assessment & Plan:  1)  Urinary incontinence> discussed w/ JVF, rx Ditropan 5mg  TID, f/u w/ LHE  2) Night sweats, hunger> will check thyroid panel, CBC, CMP  Meds:  Meds ordered this encounter  Medications  . oxybutynin (DITROPAN) 5 MG tablet    Sig: Take 1 tablet (5 mg total) by mouth 3 (three) times daily.    Dispense:  90 tablet    Refill:  1    Order Specific Question:   Supervising Provider    Answer:   Duane Lope H [[id number]]    Orders Placed This Encounter  Procedures  . Thyroid Panel With TSH  . CBC  . Comprehensive metabolic panel    Return in about 2 weeks (around 04/05/2017) for F/U w/ LHE.  Marge Duncans CNM, [facility] 03/22/2017 10:32 AM

## 2017-03-23 LAB — COMPREHENSIVE METABOLIC PANEL
ALT: 6 IU/L (ref 0–32)
AST: 11 IU/L (ref 0–40)
Albumin/Globulin Ratio: 1.3 (ref 1.2–2.2)
Albumin: 4.1 g/dL (ref 3.5–5.5)
Alkaline Phosphatase: 63 IU/L (ref 39–117)
BUN/Creatinine Ratio: 16 (ref 9–23)
BUN: 14 mg/dL (ref 6–20)
Bilirubin Total: <0.2 mg/dL (ref 0.0–1.2)
CO2: 22 mmol/L (ref 20–29)
Calcium: 9.4 mg/dL (ref 8.7–10.2)
Chloride: 103 mmol/L (ref 96–106)
Creatinine, Ser: 0.89 mg/dL (ref 0.57–1.00)
GFR calc Af Amer: 103 mL/min/{1.73_m2} (ref >59)
GFR calc non Af Amer: 89 mL/min/{1.73_m2} (ref >59)
GLUCOSE: 83 mg/dL (ref 65–99)
Globulin, Total: 3.1 g/dL (ref 1.5–4.5)
Potassium: 5.2 mmol/L (ref 3.5–5.2)
Sodium: 139 mmol/L (ref 134–144)
Total Protein: 7.2 g/dL (ref 6.0–8.5)

## 2017-03-23 LAB — CBC
HEMOGLOBIN: 13.7 g/dL (ref 11.1–15.9)
Hematocrit: 39.3 % (ref 34.0–46.6)
MCH: 31.1 pg (ref 26.6–33.0)
MCHC: 34.9 g/dL (ref 31.5–35.7)
MCV: 89 fL (ref 79–97)
Platelets: 288 10*3/uL (ref 150–379)
RBC: 4.4 x10E6/uL (ref 3.77–5.28)
RDW: 13.2 % (ref 12.3–15.4)
WBC: 10.4 10*3/uL (ref 3.4–10.8)

## 2017-03-23 LAB — THYROID PANEL WITH TSH
FREE THYROXINE INDEX: 1.4 (ref 1.2–4.9)
T3 UPTAKE RATIO: 18 % — AB (ref 24–39)
T4, Total: 7.9 ug/dL (ref 4.5–12.0)
TSH: 1.75 u[IU]/mL (ref 0.450–4.500)

## 2017-04-05 ENCOUNTER — Ambulatory Visit: Payer: Medicaid Other | Admitting: Obstetrics & Gynecology

## 2017-04-05 ENCOUNTER — Encounter: Payer: Self-pay | Admitting: Obstetrics & Gynecology

## 2017-04-05 VITALS — BP 100/80 | HR 95 | Wt 142.0 lb

## 2017-04-05 DIAGNOSIS — N3281 Overactive bladder: Secondary | ICD-10-CM

## 2017-04-05 DIAGNOSIS — N3941 Urge incontinence: Secondary | ICD-10-CM | POA: Diagnosis not present

## 2017-04-05 MED ORDER — MIRABEGRON ER 50 MG PO TB24: 50.0000 mg | ORAL_TABLET | Freq: Every day | ORAL | 11 refills | Status: DC

## 2017-04-05 MED ORDER — OXYBUTYNIN CHLORIDE ER 10 MG PO TB24: 10.0000 mg | ORAL_TABLET | Freq: Every day | ORAL | 11 refills | Status: DC

## 2017-04-05 NOTE — Progress Notes (Signed)
Chief Complaint  Patient presents with  . Follow-up    medication-Uribel      27 y.o. G2P1011 No LMP recorded. The current method of family planning is OCP (estrogen/progesterone).  Outpatient Encounter Medications as of 04/05/2017  Medication Sig  . divalproex (DEPAKOTE ER) 500 MG 24 hr tablet Take by mouth daily.  . hydrOXYzine (ATARAX/VISTARIL) 10 MG tablet Take 10 mg by mouth 3 (three) times daily as needed.  . Meth-Hyo-M Bl-Na Phos-Ph Sal (URIBEL) 118 MG CAPS 1 capsule PO TID x 14 days  . norelgestromin-ethinyl estradiol (ORTHO EVRA) 150-35 MCG/24HR transdermal patch Place 1 patch on skin weekly x 3 weeks, leave off 4th week for period  . sertraline (ZOLOFT) 50 MG tablet Take 50 mg by mouth daily.  . metroNIDAZOLE (METROGEL VAGINAL) 0.75 % vaginal gel Place 1 Applicatorful vaginally at bedtime. X 5 nights, no alcohol or sex while using (Patient not taking: Reported on 03/22/2017)  . mirabegron ER (MYRBETRIQ) 50 MG TB24 tablet Take 1 tablet (50 mg total) by mouth daily.  Marland Kitchen oxybutynin (DITROPAN) 5 MG tablet Take 1 tablet (5 mg total) by mouth 3 (three) times daily. (Patient not taking: Reported on 04/05/2017)  . oxybutynin (DITROPAN-XL) 10 MG 24 hr tablet Take 1 tablet (10 mg total) by mouth at bedtime.   No facility-administered encounter medications on file as of 04/05/2017.     Subjective Since delivery has had worsening urine loss Unprovoked mostly During sleep No dysuria No recurrent UTI All since delivery, ddid not experience before Past Medical History:  Diagnosis Date  . Anxiety   . Asthma   . Tachycardia   . Vaginal Pap smear, abnormal     Past Surgical History:  Procedure Laterality Date  . CARDIAC CATHETERIZATION  2009   states due to tacycardia, had alblation  . CARDIAC CATHETERIZATION  2009  . CESAREAN SECTION N/A 10/03/2014   Procedure: CESAREAN SECTION;  Surgeon: Adam Phenix, MD;  Location: [facility];  Service: Obstetrics;  Laterality: N/A;    OB  History    Gravida Para Term Preterm AB Living   2 1 1   1 1    SAB TAB Ectopic Multiple Live Births   1     0 1      No Known Allergies  Social History   Socioeconomic History  . Marital status: Single    Spouse name: None  . Number of children: None  . Years of education: None  . Highest education level: None  Social Needs  . Financial resource strain: None  . Food insecurity - worry: None  . Food insecurity - inability: None  . Transportation needs - medical: None  . Transportation needs - non-medical: None  Occupational History  . None  Tobacco Use  . Smoking status: Current Every Day Smoker    Packs/day: 0.25    Years: 10.00    Pack years: 2.50    Types: Cigarettes  . Smokeless tobacco: Never Used  Substance and Sexual Activity  . Alcohol use: Yes    Alcohol/week: 0.0 oz    Comment: occ beer; not now  . Drug use: No  . Sexual activity: Yes    Birth control/protection: Pill  Other Topics Concern  . None  Social History Narrative  . None    Family History  Problem Relation Age of Onset  . Thyroid disease Maternal Aunt   . Heart Problems Maternal Aunt   . Cancer Maternal Grandmother   .  Other Maternal Grandmother        brain tumor  . Cancer Maternal Grandfather     Medications:       Current Outpatient Medications:  .  divalproex (DEPAKOTE ER) 500 MG 24 hr tablet, Take by mouth daily., Disp: , Rfl:  .  hydrOXYzine (ATARAX/VISTARIL) 10 MG tablet, Take 10 mg by mouth 3 (three) times daily as needed., Disp: , Rfl:  .  Meth-Hyo-M Bl-Na Phos-Ph Sal (URIBEL) 118 MG CAPS, 1 capsule PO TID x 14 days, Disp: 42 capsule, Rfl: 0 .  norelgestromin-ethinyl estradiol (ORTHO EVRA) 150-35 MCG/24HR transdermal patch, Place 1 patch on skin weekly x 3 weeks, leave off 4th week for period, Disp: 9 patch, Rfl: 3 .  sertraline (ZOLOFT) 50 MG tablet, Take 50 mg by mouth daily., Disp: , Rfl:  .  metroNIDAZOLE (METROGEL VAGINAL) 0.75 % vaginal gel, Place 1 Applicatorful  vaginally at bedtime. X 5 nights, no alcohol or sex while using (Patient not taking: Reported on 03/22/2017), Disp: 70 g, Rfl: 0 .  mirabegron ER (MYRBETRIQ) 50 MG TB24 tablet, Take 1 tablet (50 mg total) by mouth daily., Disp: 30 tablet, Rfl: 11 .  oxybutynin (DITROPAN) 5 MG tablet, Take 1 tablet (5 mg total) by mouth 3 (three) times daily. (Patient not taking: Reported on 04/05/2017), Disp: 90 tablet, Rfl: 1 .  oxybutynin (DITROPAN-XL) 10 MG 24 hr tablet, Take 1 tablet (10 mg total) by mouth at bedtime., Disp: 30 tablet, Rfl: 11  Objective Blood pressure 100/80, pulse 95, weight 142 lb (64.4 kg).  General WDWN female NAD Vulva:  normal appearing vulva with no masses, tenderness or lesions Vagina:  normal mucosa, no lesions or discharge noted, no  Lesions, bladder neck with appropriate mobility, not excessive Cervix:  no cervical motion tenderness and no lesions Uterus:  normal size, contour, position, consistency, mobility, non-tender Adnexa: ovaries:present,  normal adnexa in size, nontender and no masses   Pertinent ROS No burning with urination, frequency or urgency No nausea, vomiting or diarrhea Nor fever chills or other constitutional symptoms   Labs or studies No new    Impression Diagnoses this Encounter::   ICD-10-CM   1. OAB (overactive bladder) N32.81   2. Urge incontinence N39.41     Established relevant diagnosis(es):   Plan/Recommendations: Meds ordered this encounter  Medications  . mirabegron ER (MYRBETRIQ) 50 MG TB24 tablet    Sig: Take 1 tablet (50 mg total) by mouth daily.    Dispense:  30 tablet    Refill:  11  . oxybutynin (DITROPAN-XL) 10 MG 24 hr tablet    Sig: Take 1 tablet (10 mg total) by mouth at bedtime.    Dispense:  30 tablet    Refill:  11    Labs or Scans Ordered: No orders of the defined types were placed in this encounter.   Management:: Will use 2 different receptor site meds to treat this challenging pt, anti cholinergic + Beta  3 adrenergic agonist  Follow up Return in about 6 weeks (around 05/17/2017) for Follow up, with Dr Despina HiddenEure.     All questions were answered.

## 2017-05-14 ENCOUNTER — Encounter: Payer: Self-pay | Admitting: Obstetrics & Gynecology

## 2017-05-14 ENCOUNTER — Ambulatory Visit: Payer: Medicaid Other | Admitting: Obstetrics & Gynecology

## 2017-05-14 VITALS — BP 104/62 | HR 72 | Ht 63.0 in | Wt 142.5 lb

## 2017-05-14 DIAGNOSIS — N3281 Overactive bladder: Secondary | ICD-10-CM | POA: Diagnosis not present

## 2017-05-14 DIAGNOSIS — Z331 Pregnant state, incidental: Secondary | ICD-10-CM

## 2017-05-14 DIAGNOSIS — N3941 Urge incontinence: Secondary | ICD-10-CM | POA: Diagnosis not present

## 2017-05-14 DIAGNOSIS — Z1389 Encounter for screening for other disorder: Secondary | ICD-10-CM

## 2017-05-14 NOTE — Progress Notes (Signed)
Chief Complaint  Patient presents with  . Follow-up      27 y.o. G2P1011 No LMP recorded. The current method of family planning is Ortho-Evra patches weekly.  Outpatient Encounter Medications as of 05/14/2017  Medication Sig  . divalproex (DEPAKOTE ER) 500 MG 24 hr tablet Take by mouth daily.  . hydrOXYzine (ATARAX/VISTARIL) 10 MG tablet Take 10 mg by mouth 3 (three) times daily as needed.  . norelgestromin-ethinyl estradiol (ORTHO EVRA) 150-35 MCG/24HR transdermal patch Place 1 patch on skin weekly x 3 weeks, leave off 4th week for period (Patient not taking: Reported on 09/03/2017)  . [DISCONTINUED] Meth-Hyo-M Bl-Na Phos-Ph Sal (URIBEL) 118 MG CAPS 1 capsule PO TID x 14 days  . [DISCONTINUED] mirabegron ER (MYRBETRIQ) 50 MG TB24 tablet Take 1 tablet (50 mg total) by mouth daily.  . [DISCONTINUED] sertraline (ZOLOFT) 50 MG tablet Take 100 mg by mouth daily.   . [DISCONTINUED] metroNIDAZOLE (METROGEL VAGINAL) 0.75 % vaginal gel Place 1 Applicatorful vaginally at bedtime. X 5 nights, no alcohol or sex while using (Patient not taking: Reported on 03/22/2017)  . [DISCONTINUED] oxybutynin (DITROPAN) 5 MG tablet Take 1 tablet (5 mg total) by mouth 3 (three) times daily. (Patient not taking: Reported on 04/05/2017)  . [DISCONTINUED] oxybutynin (DITROPAN-XL) 10 MG 24 hr tablet Take 1 tablet (10 mg total) by mouth at bedtime. (Patient not taking: Reported on 05/14/2017)   No facility-administered encounter medications on file as of 05/14/2017.     Subjective On therapyfor OAB Improving but needs dual therapy Fewer episode, somewhat better No new symptoms Tolerating meds Past Medical History:  Diagnosis Date  . Anxiety   . Asthma   . Tachycardia   . Vaginal Pap smear, abnormal     Past Surgical History:  Procedure Laterality Date  . CARDIAC CATHETERIZATION  2009   states due to tacycardia, had alblation  . CARDIAC CATHETERIZATION  2009  . CESAREAN SECTION N/A 10/03/2014   Procedure: CESAREAN SECTION;  Surgeon: Adam Phenix, MD;  Location: [facility];  Service: Obstetrics;  Laterality: N/A;    OB History    Gravida  2   Para  1   Term  1   Preterm      AB  1   Living  1     SAB  1   TAB      Ectopic      Multiple  0   Live Births  1           No Known Allergies  Social History   Socioeconomic History  . Marital status: Single    Spouse name: Not on file  . Number of children: Not on file  . Years of education: Not on file  . Highest education level: Not on file  Occupational History  . Not on file  Social Needs  . Financial resource strain: Not on file  . Food insecurity:    Worry: Not on file    Inability: Not on file  . Transportation needs:    Medical: Not on file    Non-medical: Not on file  Tobacco Use  . Smoking status: Current Every Day Smoker    Packs/day: 0.00    Years: 10.00    Pack years: 0.00    Types: Cigarettes  . Smokeless tobacco: Never Used  . Tobacco comment: smokes 4-5 cig daily  Substance and Sexual Activity  . Alcohol use: Yes    Alcohol/week: 0.0 standard drinks  Comment: occ beer; not now  . Drug use: No  . Sexual activity: Yes    Birth control/protection: None  Lifestyle  . Physical activity:    Days per week: Not on file    Minutes per session: Not on file  . Stress: Not on file  Relationships  . Social connections:    Talks on phone: Not on file    Gets together: Not on file    Attends religious service: Not on file    Active member of club or organization: Not on file    Attends meetings of clubs or organizations: Not on file    Relationship status: Not on file  Other Topics Concern  . Not on file  Social History Narrative  . Not on file    Family History  Problem Relation Age of Onset  . Thyroid disease Maternal Aunt   . Heart Problems Maternal Aunt   . Cancer Maternal Grandmother   . Other Maternal Grandmother        brain tumor  . Cancer Maternal Grandfather      Medications:       Current Outpatient Medications:  .  divalproex (DEPAKOTE ER) 500 MG 24 hr tablet, Take by mouth daily., Disp: , Rfl:  .  hydrOXYzine (ATARAX/VISTARIL) 10 MG tablet, Take 10 mg by mouth 3 (three) times daily as needed., Disp: , Rfl:  .  norelgestromin-ethinyl estradiol (ORTHO EVRA) 150-35 MCG/24HR transdermal patch, Place 1 patch on skin weekly x 3 weeks, leave off 4th week for period (Patient not taking: Reported on 09/03/2017), Disp: 9 patch, Rfl: 3 .  busPIRone (BUSPAR) 10 MG tablet, Take 10 mg by mouth 3 (three) times daily., Disp: , Rfl:  .  mirabegron ER (MYRBETRIQ) 50 MG TB24 tablet, Take 1 tablet (50 mg total) by mouth daily., Disp: 30 tablet, Rfl: 11 .  Prenat w/o A-[id number]-DHA (CITRANATAL ASSURE) 35-1 & 300 MG tablet, One tablet and one capsule daily, Disp: 60 tablet, Rfl: 11 .  sertraline (ZOLOFT) 100 MG tablet, Take 100 mg by mouth daily., Disp: , Rfl: 3 .  solifenacin (VESICARE) 10 MG tablet, Take 1 tablet (10 mg total) by mouth daily., Disp: 30 tablet, Rfl: 11 .  traZODone (DESYREL) 50 MG tablet, TAKE 1 TO 1 & 1/2 (ONE TO ONE & ONE-HALF) TABLETS BY MOUTH AT BEDTIME AS NEEDED, Disp: , Rfl: 3  Objective Blood pressure 104/62, pulse 72, height 5\' 3"  (1.6 m), weight 142 lb 8 oz (64.6 kg).  No cystocoele noted Normal exam  Pertinent ROS No burning with urination, frequency or urgency No nausea, vomiting or diarrhea Nor fever chills or other constitutional symptoms   Labs or studies No new    Impression Diagnoses this Encounter::   ICD-10-CM   1. Urge incontinence N39.41   2. Pregnant state, incidental Z33.1 POCT Urinalysis Dipstick  3. Screening for genitourinary condition Z13.89 POCT Urinalysis Dipstick  4. OAB (overactive bladder) N32.81     Established relevant diagnosis(es):   Plan/Recommendations: No orders of the defined types were placed in this encounter.   Labs or Scans Ordered: Orders Placed This Encounter  Procedures  .  POCT Urinalysis Dipstick    Management:: Continue myrbetriq 50 mg, use the ditropan 10 mg XL if unable to get the myrbetriq samples  Follow up Return in about 1 year (around 05/15/2018) for yearly.        Face to face time:  10 minutes  Greater than 50% of the visit time was spent  in counseling and coordination of care with the patient.  The summary and outline of the counseling and care coordination is summarized in the note above.   All questions were answered.

## 2017-06-19 ENCOUNTER — Other Ambulatory Visit: Payer: Self-pay

## 2017-06-19 ENCOUNTER — Encounter: Payer: Self-pay | Admitting: Women's Health

## 2017-06-19 ENCOUNTER — Ambulatory Visit (INDEPENDENT_AMBULATORY_CARE_PROVIDER_SITE_OTHER): Payer: Medicaid Other | Admitting: Women's Health

## 2017-06-19 VITALS — BP 118/74 | HR 83 | Ht 63.0 in | Wt 143.0 lb

## 2017-06-19 DIAGNOSIS — N3281 Overactive bladder: Secondary | ICD-10-CM | POA: Diagnosis not present

## 2017-06-19 DIAGNOSIS — N941 Unspecified dyspareunia: Secondary | ICD-10-CM

## 2017-06-19 DIAGNOSIS — N898 Other specified noninflammatory disorders of vagina: Secondary | ICD-10-CM | POA: Diagnosis not present

## 2017-06-19 DIAGNOSIS — R3989 Other symptoms and signs involving the genitourinary system: Secondary | ICD-10-CM

## 2017-06-19 DIAGNOSIS — R32 Unspecified urinary incontinence: Secondary | ICD-10-CM

## 2017-06-19 DIAGNOSIS — R8290 Unspecified abnormal findings in urine: Secondary | ICD-10-CM

## 2017-06-19 NOTE — Patient Instructions (Signed)
Interstitial cystitis

## 2017-06-19 NOTE — Progress Notes (Signed)
GYN VISIT Patient name: Mary Meyer MRN [id number]  Date of birth: [birth year] Chief Complaint:   vaginal odor (and c/o myrbetriq not working as well )  History of Present Illness:   Mary Meyer is a 27 y.o. G15P1011 Caucasian female being seen today for report of Myrbetriq not working as well as it did in the beginning. Is having urinary incontinence again, takes shower up to 3x/day and w/in an hour starts smelling strong urine odor again d/t leakage. Denies UTI s/s, however wants to check to make sure she doesn't have one. Still has pain w/ sex. Irritation and itching after sex the other day. Denies abnormal discharge, itching/odor/irritation.       Patient's last menstrual period was 06/19/2017. The current method of family planning is Ortho-Evra patches weekly. Last pap 01/23/17. Results were:  normal Review of Systems:   Pertinent items are noted in HPI Denies fever/chills, dizziness, headaches, visual disturbances, fatigue, shortness of breath, chest pain, abdominal pain, vomiting, abnormal vaginal discharge/itching/odor/irritation, problems with periods, bowel movements, urination, or intercourse unless otherwise stated above.  Pertinent History Reviewed:  Reviewed past medical,surgical, social, obstetrical and family history.  Reviewed problem list, medications and allergies. Physical Assessment:   Vitals:   06/19/17 1513  BP: 118/74  Pulse: 83  Weight: 143 lb (64.9 kg)  Height: 5\' 3"  (1.6 m)  Body mass index is 25.33 kg/m.       Physical Examination:   General appearance: alert, well appearing, and in no distress  Mental status: alert, oriented to person, place, and time  Skin: warm & dry   Cardiovascular: normal heart rate noted  Respiratory: normal respiratory effort, no distress  Abdomen: soft, non-tender   Pelvic: VULVA: normal appearing vulva with no masses, tenderness or lesions, VAGINA: normal appearing vagina with normal color and discharge, no  lesions, CERVIX: normal appearing cervix without discharge or lesions, UTERUS: uterus is normal size, shape, consistency and nontender; however anterior portion of vagina/bladder very tender to touch  Extremities: no edema   No results found for this or any previous visit (from the past 24 hour(s)).  Assessment & Plan:  1) Overactive bladder w/ urinary incontinence> mybretriq no longer helping  2) Bladder pain> will send urine cx, consider IC?   Meds: No orders of the defined types were placed in this encounter.   Orders Placed This Encounter  Procedures  . Urine Culture  . NuSwab Vaginitis Plus (VG+)    Return for next week w/ [facility].  Cheral Marker CNM, [facility] 06/19/2017 5:02 PM

## 2017-06-21 LAB — URINE CULTURE: ORGANISM ID, BACTERIA: NO GROWTH

## 2017-06-22 LAB — NUSWAB VAGINITIS PLUS (VG+)
CHLAMYDIA TRACHOMATIS, NAA: NEGATIVE
Candida albicans, NAA: NEGATIVE
Candida glabrata, NAA: POSITIVE — AB
NEISSERIA GONORRHOEAE, NAA: NEGATIVE
TRICH VAG BY NAA: NEGATIVE

## 2017-06-24 ENCOUNTER — Other Ambulatory Visit: Payer: Self-pay | Admitting: Women's Health

## 2017-06-24 ENCOUNTER — Telehealth: Payer: Self-pay | Admitting: *Deleted

## 2017-06-24 MED ORDER — MICONAZOLE NITRATE 2 % VA CREA: 1.0000 | TOPICAL_CREAM | Freq: Every day | VAGINAL | 0 refills | Status: DC

## 2017-06-24 NOTE — Telephone Encounter (Signed)
DOB verified. Informed pt that Nuswab showed yeast and that Rx for Monistat had been sent to her pharmacy. Pt verbalized understanding.

## 2017-06-28 ENCOUNTER — Ambulatory Visit (INDEPENDENT_AMBULATORY_CARE_PROVIDER_SITE_OTHER): Payer: Medicaid Other | Admitting: Obstetrics & Gynecology

## 2017-06-28 ENCOUNTER — Encounter: Payer: Self-pay | Admitting: Obstetrics & Gynecology

## 2017-06-28 VITALS — BP 102/78 | HR 71 | Ht 63.0 in | Wt 140.0 lb

## 2017-06-28 DIAGNOSIS — N3281 Overactive bladder: Secondary | ICD-10-CM | POA: Diagnosis not present

## 2017-06-28 MED ORDER — SOLIFENACIN SUCCINATE 10 MG PO TABS: 10.0000 mg | ORAL_TABLET | Freq: Every day | ORAL | 11 refills | Status: DC

## 2017-06-28 MED ORDER — MIRABEGRON ER 50 MG PO TB24: 50.0000 mg | ORAL_TABLET | Freq: Every day | ORAL | 11 refills | Status: DC

## 2017-06-28 NOTE — Progress Notes (Signed)
Chief Complaint  Patient presents with  . discuss Myrbetriq    not helping as much now      28 y.o. G2P1011 Patient's last menstrual period was 06/19/2017. The current method of family planning is OCP (estrogen/progesterone).  Outpatient Encounter Medications as of 06/28/2017  Medication Sig  . divalproex (DEPAKOTE ER) 500 MG 24 hr tablet Take by mouth daily.  . hydrOXYzine (ATARAX/VISTARIL) 10 MG tablet Take 10 mg by mouth 3 (three) times daily as needed.  . miconazole (MONISTAT 7) 2 % vaginal cream Place 1 Applicatorful vaginally at bedtime. X 7 nights  . norelgestromin-ethinyl estradiol (ORTHO EVRA) 150-35 MCG/24HR transdermal patch Place 1 patch on skin weekly x 3 weeks, leave off 4th week for period  . sertraline (ZOLOFT) 50 MG tablet Take 50 mg by mouth daily.  . mirabegron ER (MYRBETRIQ) 50 MG TB24 tablet Take 1 tablet (50 mg total) by mouth daily.  . solifenacin (VESICARE) 10 MG tablet Take 1 tablet (10 mg total) by mouth daily.  . [DISCONTINUED] mirabegron ER (MYRBETRIQ) 50 MG TB24 tablet Take 1 tablet (50 mg total) by mouth daily.  . [DISCONTINUED] oxybutynin (DITROPAN) 5 MG tablet Take 1 tablet (5 mg total) by mouth 3 (three) times daily. (Patient not taking: Reported on 04/05/2017)  . [DISCONTINUED] oxybutynin (DITROPAN-XL) 10 MG 24 hr tablet Take 1 tablet (10 mg total) by mouth at bedtime. (Patient not taking: Reported on 05/14/2017)   No facility-administered encounter medications on file as of 06/28/2017.     Subjective Mary Meyer here for follow up of OAB myrbetriq is moderately helpful but maybe a little worse than when started Episodes and frequency is less Past Medical History:  Diagnosis Date  . Anxiety   . Asthma   . Tachycardia   . Vaginal Pap smear, abnormal     Past Surgical History:  Procedure Laterality Date  . CARDIAC CATHETERIZATION  2009   states due to tacycardia, had alblation  . CARDIAC CATHETERIZATION  2009  . CESAREAN SECTION  N/A 10/03/2014   Procedure: CESAREAN SECTION;  Surgeon: Adam Phenix, MD;  Location: [facility];  Service: Obstetrics;  Laterality: N/A;    OB History    Gravida  2   Para  1   Term  1   Preterm      AB  1   Living  1     SAB  1   TAB      Ectopic      Multiple  0   Live Births  1           No Known Allergies  Social History   Socioeconomic History  . Marital status: Single    Spouse name: Not on file  . Number of children: Not on file  . Years of education: Not on file  . Highest education level: Not on file  Occupational History  . Not on file  Social Needs  . Financial resource strain: Not on file  . Food insecurity:    Worry: Not on file    Inability: Not on file  . Transportation needs:    Medical: Not on file    Non-medical: Not on file  Tobacco Use  . Smoking status: Current Every Day Smoker    Packs/day: 0.00    Years: 10.00    Pack years: 0.00    Types: Cigarettes  . Smokeless tobacco: Never Used  . Tobacco comment: smokes 4-5 cig daily  Substance  and Sexual Activity  . Alcohol use: Yes    Alcohol/week: 0.0 oz    Comment: occ beer; not now  . Drug use: No  . Sexual activity: Not Currently    Birth control/protection: Patch  Lifestyle  . Physical activity:    Days per week: Not on file    Minutes per session: Not on file  . Stress: Not on file  Relationships  . Social connections:    Talks on phone: Not on file    Gets together: Not on file    Attends religious service: Not on file    Active member of club or organization: Not on file    Attends meetings of clubs or organizations: Not on file    Relationship status: Not on file  Other Topics Concern  . Not on file  Social History Narrative  . Not on file    Family History  Problem Relation Age of Onset  . Thyroid disease Maternal Aunt   . Heart Problems Maternal Aunt   . Cancer Maternal Grandmother   . Other Maternal Grandmother        brain tumor  . Cancer Maternal  Grandfather     Medications:       Current Outpatient Medications:  .  divalproex (DEPAKOTE ER) 500 MG 24 hr tablet, Take by mouth daily., Disp: , Rfl:  .  hydrOXYzine (ATARAX/VISTARIL) 10 MG tablet, Take 10 mg by mouth 3 (three) times daily as needed., Disp: , Rfl:  .  miconazole (MONISTAT 7) 2 % vaginal cream, Place 1 Applicatorful vaginally at bedtime. X 7 nights, Disp: 45 g, Rfl: 0 .  norelgestromin-ethinyl estradiol (ORTHO EVRA) 150-35 MCG/24HR transdermal patch, Place 1 patch on skin weekly x 3 weeks, leave off 4th week for period, Disp: 9 patch, Rfl: 3 .  sertraline (ZOLOFT) 50 MG tablet, Take 50 mg by mouth daily., Disp: , Rfl:  .  mirabegron ER (MYRBETRIQ) 50 MG TB24 tablet, Take 1 tablet (50 mg total) by mouth daily., Disp: 30 tablet, Rfl: 11 .  solifenacin (VESICARE) 10 MG tablet, Take 1 tablet (10 mg total) by mouth daily., Disp: 30 tablet, Rfl: 11  Objective Blood pressure 102/78, pulse 71, height  (1.6 m), weight 140 lb (63.5 kg), last menstrual period 06/19/2017.  Gen WDWN female NAD  Pertinent ROS No burning with urination, frequency or urgency No nausea, vomiting or diarrhea Nor fever chills or other constitutional symptoms   Labs or studies     Impression Diagnoses this Encounter::   ICD-10-CM   1. OAB (overactive bladder) N32.81     Established relevant diagnosis(es):   Plan/Recommendations: Meds ordered this encounter  Medications  . solifenacin (VESICARE) 10 MG tablet    Sig: Take 1 tablet (10 mg total) by mouth daily.    Dispense:  30 tablet    Refill:  11  . mirabegron ER (MYRBETRIQ) 50 MG TB24 tablet    Sig: Take 1 tablet (50 mg total) by mouth daily.    Dispense:  30 tablet    Refill:  11    Labs or Scans Ordered: No orders of the defined types were placed in this encounter.   Management:: Will combine vesicare and myrbetriq together for different receptor action and see if symptoms are better  Follow up Return in about 6 weeks  (around 08/09/2017) for Follow up, with Dr Despina Hidden.   All questions were answered.

## 2017-07-12 ENCOUNTER — Encounter: Payer: Self-pay | Admitting: Obstetrics & Gynecology

## 2017-08-09 ENCOUNTER — Encounter: Payer: Self-pay | Admitting: Obstetrics & Gynecology

## 2017-08-09 ENCOUNTER — Ambulatory Visit: Payer: Medicaid Other | Admitting: Obstetrics & Gynecology

## 2017-08-09 VITALS — BP 128/76 | HR 87 | Wt 139.0 lb

## 2017-08-09 DIAGNOSIS — N3281 Overactive bladder: Secondary | ICD-10-CM

## 2017-08-09 NOTE — Progress Notes (Signed)
Chief Complaint  Patient presents with  . Follow-up      27 y.o. G2P1011 No LMP recorded. The current method of family planning is OCP (estrogen/progesterone).  Outpatient Encounter Medications as of 08/09/2017  Medication Sig  . divalproex (DEPAKOTE ER) 500 MG 24 hr tablet Take by mouth daily.  . mirabegron ER (MYRBETRIQ) 50 MG TB24 tablet Take 1 tablet (50 mg total) by mouth daily.  . norelgestromin-ethinyl estradiol (ORTHO EVRA) 150-35 MCG/24HR transdermal patch Place 1 patch on skin weekly x 3 weeks, leave off 4th week for period  . sertraline (ZOLOFT) 100 MG tablet Take 100 mg by mouth daily.  . solifenacin (VESICARE) 10 MG tablet Take 1 tablet (10 mg total) by mouth daily.  . traZODone (DESYREL) 50 MG tablet TAKE 1 TO 1 & 1/2 (ONE TO ONE & ONE-HALF) TABLETS BY MOUTH AT BEDTIME AS NEEDED  . hydrOXYzine (ATARAX/VISTARIL) 10 MG tablet Take 10 mg by mouth 3 (three) times daily as needed.  . [DISCONTINUED] miconazole (MONISTAT 7) 2 % vaginal cream Place 1 Applicatorful vaginally at bedtime. X 7 nights (Patient not taking: Reported on 08/09/2017)  . [DISCONTINUED] sertraline (ZOLOFT) 50 MG tablet Take 100 mg by mouth daily.    No facility-administered encounter medications on file as of 08/09/2017.     Subjective Mary Meyer is in for follow up She has severe OAB with incontinence and had unsatisfactory response to myrbetriq alone I added vesicare 10 to her regimen and today she states she is significantly improved, she has gone from urine loss a few times daily to a couple of times weekly, so she is feeling much better Past Medical History:  Diagnosis Date  . Anxiety   . Asthma   . Tachycardia   . Vaginal Pap smear, abnormal     Past Surgical History:  Procedure Laterality Date  . CARDIAC CATHETERIZATION  2009   states due to tacycardia, had alblation  . CARDIAC CATHETERIZATION  2009  . CESAREAN SECTION N/A 10/03/2014   Procedure: CESAREAN SECTION;  Surgeon:  Adam PhenixJames G Arnold, MD;  Location: [facility];  Service: Obstetrics;  Laterality: N/A;    OB History    Gravida  2   Para  1   Term  1   Preterm      AB  1   Living  1     SAB  1   TAB      Ectopic      Multiple  0   Live Births  1           No Known Allergies  Social History   Socioeconomic History  . Marital status: Single    Spouse name: Not on file  . Number of children: Not on file  . Years of education: Not on file  . Highest education level: Not on file  Occupational History  . Not on file  Social Needs  . Financial resource strain: Not on file  . Food insecurity:    Worry: Not on file    Inability: Not on file  . Transportation needs:    Medical: Not on file    Non-medical: Not on file  Tobacco Use  . Smoking status: Current Every Day Smoker    Packs/day: 0.00    Years: 10.00    Pack years: 0.00    Types: Cigarettes  . Smokeless tobacco: Never Used  . Tobacco comment: smokes 4-5 cig daily  Substance and Sexual Activity  .  Alcohol use: Yes    Alcohol/week: 0.0 oz    Comment: occ beer; not now  . Drug use: No  . Sexual activity: Yes    Birth control/protection: Patch  Lifestyle  . Physical activity:    Days per week: Not on file    Minutes per session: Not on file  . Stress: Not on file  Relationships  . Social connections:    Talks on phone: Not on file    Gets together: Not on file    Attends religious service: Not on file    Active member of club or organization: Not on file    Attends meetings of clubs or organizations: Not on file    Relationship status: Not on file  Other Topics Concern  . Not on file  Social History Narrative  . Not on file    Family History  Problem Relation Age of Onset  . Thyroid disease Maternal Aunt   . Heart Problems Maternal Aunt   . Cancer Maternal Grandmother   . Other Maternal Grandmother        brain tumor  . Cancer Maternal Grandfather     Medications:       Current Outpatient  Medications:  .  divalproex (DEPAKOTE ER) 500 MG 24 hr tablet, Take by mouth daily., Disp: , Rfl:  .  mirabegron ER (MYRBETRIQ) 50 MG TB24 tablet, Take 1 tablet (50 mg total) by mouth daily., Disp: 30 tablet, Rfl: 11 .  norelgestromin-ethinyl estradiol (ORTHO EVRA) 150-35 MCG/24HR transdermal patch, Place 1 patch on skin weekly x 3 weeks, leave off 4th week for period, Disp: 9 patch, Rfl: 3 .  sertraline (ZOLOFT) 100 MG tablet, Take 100 mg by mouth daily., Disp: , Rfl: 3 .  solifenacin (VESICARE) 10 MG tablet, Take 1 tablet (10 mg total) by mouth daily., Disp: 30 tablet, Rfl: 11 .  traZODone (DESYREL) 50 MG tablet, TAKE 1 TO 1 & 1/2 (ONE TO ONE & ONE-HALF) TABLETS BY MOUTH AT BEDTIME AS NEEDED, Disp: , Rfl: 3 .  hydrOXYzine (ATARAX/VISTARIL) 10 MG tablet, Take 10 mg by mouth 3 (three) times daily as needed., Disp: , Rfl:   Objective Blood pressure 128/76, pulse 87, weight 139 lb (63 kg).  Gen WDWN NAD  Pertinent ROS No burning with urination, frequency or urgency No nausea, vomiting or diarrhea Nor fever chills or other constitutional symptoms   Labs or studies     Impression Diagnoses this Encounter::   ICD-10-CM   1. OAB (overactive bladder) N32.81    significantly improved on vesicare + myrbetriq combined     Established relevant diagnosis(es):   Plan/Recommendations: No orders of the defined types were placed in this encounter.   Labs or Scans Ordered: No orders of the defined types were placed in this encounter.   Management:: Continue with myrbetriq 50 + vesicare 10 daily  Follow up Return in about 6 months (around 01/27/2018) for yearly with Joellyn Haff.       All questions were answered.

## 2017-08-27 DIAGNOSIS — F4310 Post-traumatic stress disorder, unspecified: Secondary | ICD-10-CM | POA: Diagnosis not present

## 2017-09-03 ENCOUNTER — Ambulatory Visit: Payer: Medicaid Other | Admitting: Women's Health

## 2017-09-03 ENCOUNTER — Encounter: Payer: Self-pay | Admitting: Women's Health

## 2017-09-03 VITALS — BP 102/56 | HR 77 | Ht 63.0 in | Wt 138.0 lb

## 2017-09-03 DIAGNOSIS — F329 Major depressive disorder, single episode, unspecified: Secondary | ICD-10-CM | POA: Diagnosis not present

## 2017-09-03 DIAGNOSIS — F411 Generalized anxiety disorder: Secondary | ICD-10-CM

## 2017-09-03 DIAGNOSIS — Z3169 Encounter for other general counseling and advice on procreation: Secondary | ICD-10-CM | POA: Diagnosis not present

## 2017-09-03 MED ORDER — CITRANATAL ASSURE 35-1 & 300 MG PO MISC
ORAL | 11 refills | Status: DC
Start: 2017-09-03 — End: 2017-11-12

## 2017-09-03 NOTE — Progress Notes (Signed)
GYN VISIT Patient name: Mary Meyer MRN [id number]  Date of birth: [birth year] Chief Complaint:   talk about medications (wants get pregnant again)  History of Present Illness:   Mary Meyer is a 27 y.o. G15P1011 Caucasian female being seen today to discuss medications she is on/safety with pregnancy. Stopped birth control patch 1wk ago in hopes of getting pregnant. Hasn't started pnv. Is currently on vesicare and myrbetriq for OAB, depakote, buspar, zoloft and trazadone for dep/anx.      Patient's last menstrual period was 08/30/2017. The current method of family planning is none. Last pap 02/2016. Results were:  normal Review of Systems:   Pertinent items are noted in HPI Denies fever/chills, dizziness, headaches, visual disturbances, fatigue, shortness of breath, chest pain, abdominal pain, vomiting, abnormal vaginal discharge/itching/odor/irritation, problems with periods, bowel movements, urination, or intercourse unless otherwise stated above.  Pertinent History Reviewed:  Reviewed past medical,surgical, social, obstetrical and family history.  Reviewed problem list, medications and allergies. Physical Assessment:   Vitals:   09/03/17 1444  BP: (!) 102/56  Pulse: 77  Weight: 138 lb (62.6 kg)  Height: 5\' 3"  (1.6 m)  Body mass index is 24.45 kg/m.       Physical Examination:   General appearance: alert, well appearing, and in no distress  Mental status: alert, oriented to person, place, and time  Skin: warm & dry   Cardiovascular: normal heart rate noted  Respiratory: normal respiratory effort, no distress  Abdomen: soft, non-tender   Pelvic: examination not indicated  Extremities: no edema   No results found for this or any previous visit (from the past 24 hour(s)).  Assessment & Plan:  1) Desire for pregnancy> start pnv, rx sent today, call w/ +HPT  2) Dep/anx> per LHE, ok to continue zoloft/buspar, do not continue trazadone or depakote  3) OAB> LHE  unsure of data w/ vesicare/myrbetriq during pregnancy, will research and we will contact pt about these  Meds:  Meds ordered this encounter  Medications  . Prenat w/o [id number] (CITRANATAL ASSURE) 35-1 & 300 MG tablet    Sig: One tablet and one capsule daily    Dispense:  60 tablet    Refill:  11    Order Specific Question:   Supervising Provider    Answer:   Duane Lope H [[id number]]    No orders of the defined types were placed in this encounter.   Return for after 11/28 for , Pap & physical.  Cheral Marker CNM, [facility] 09/03/2017 3:35 PM

## 2017-09-06 ENCOUNTER — Telehealth: Payer: Self-pay | Admitting: Obstetrics & Gynecology

## 2017-09-06 NOTE — Telephone Encounter (Signed)
Per our conversation.

## 2017-09-23 ENCOUNTER — Telehealth: Payer: Self-pay | Admitting: *Deleted

## 2017-09-23 NOTE — Telephone Encounter (Signed)
Ok it appears vesicare is a definite no go in pregnancy, there is evidence it can cause malformation is mice  There is no data available on myrbetriq so in general we would discourage its use in pregnancy to be safe and I would not continue it for that reason  Sorry but it looks like the urine loss issues will probably return if she stops the meds

## 2017-09-23 NOTE — Telephone Encounter (Signed)
Pt called following up on medications that she is currently taking. She has questions to whether myrbetriq and vesicare are safe to take during pregnancy. She is trying to conceive.Advised that I would check with a provider and call her back with an answer. Pt verbalized understanding

## 2017-09-24 NOTE — Telephone Encounter (Signed)
Informed pt that vesicare is not recommended in pregnancy and that there is not sufficient data to recommend use of myrbetriq during pregnancy. Advised that her urine loss may return if she stops the medications for pregnancy. Pt verbalized understanding.

## 2017-09-30 DIAGNOSIS — F4310 Post-traumatic stress disorder, unspecified: Secondary | ICD-10-CM | POA: Diagnosis not present

## 2017-10-10 DIAGNOSIS — F4310 Post-traumatic stress disorder, unspecified: Secondary | ICD-10-CM | POA: Diagnosis not present

## 2017-10-14 DIAGNOSIS — F4310 Post-traumatic stress disorder, unspecified: Secondary | ICD-10-CM | POA: Diagnosis not present

## 2017-11-07 DIAGNOSIS — F4310 Post-traumatic stress disorder, unspecified: Secondary | ICD-10-CM | POA: Diagnosis not present

## 2017-11-12 ENCOUNTER — Encounter: Payer: Self-pay | Admitting: Women's Health

## 2017-11-12 ENCOUNTER — Ambulatory Visit: Payer: Medicaid Other | Admitting: Women's Health

## 2017-11-12 VITALS — BP 124/75 | HR 76 | Ht 63.0 in | Wt 135.0 lb

## 2017-11-12 DIAGNOSIS — Z30016 Encounter for initial prescription of transdermal patch hormonal contraceptive device: Secondary | ICD-10-CM | POA: Diagnosis not present

## 2017-11-12 DIAGNOSIS — Z3202 Encounter for pregnancy test, result negative: Secondary | ICD-10-CM | POA: Diagnosis not present

## 2017-11-12 LAB — POCT URINE PREGNANCY: PREG TEST UR: NEGATIVE

## 2017-11-12 MED ORDER — NORELGESTROMIN-ETH ESTRADIOL 150-35 MCG/24HR TD PTWK: 1.0000 | MEDICATED_PATCH | TRANSDERMAL | 12 refills | Status: DC

## 2017-11-12 NOTE — Progress Notes (Signed)
GYN VISIT Patient name: Mary Meyer MRN [id number]  Date of birth: Mar 09, 1990 Chief Complaint:   wants to get back on birth control patch  History of Present Illness:   Mary Meyer is a 27 y.o. G28P1011 Caucasian female being seen today to get back on birth control patches.  I had seen her in July and she had wanted to try for pregnancy then, states there is too much going on at home right now, stress w/ her mom, it's just not a good time for pregnancy right now.    Patient's last menstrual period was 11/07/2017. The current method of family planning is none. Last pap 02/2016. Results were:  normal Review of Systems:   Pertinent items are noted in HPI Denies fever/chills, dizziness, headaches, visual disturbances, fatigue, shortness of breath, chest pain, abdominal pain, vomiting, abnormal vaginal discharge/itching/odor/irritation, problems with periods, bowel movements, urination, or intercourse unless otherwise stated above.  Pertinent History Reviewed:  Reviewed past medical,surgical, social, obstetrical and family history.  Reviewed problem list, medications and allergies. Physical Assessment:   Vitals:   11/12/17 0951  BP: 124/75  Pulse: 76  Weight: 135 lb (61.2 kg)  Height: 5\' 3"  (1.6 m)  Body mass index is 23.91 kg/m.       Physical Examination:   General appearance: alert, well appearing, and in no distress  Mental status: alert, oriented to person, place, and time  Skin: warm & dry   Cardiovascular: normal heart rate noted  Respiratory: normal respiratory effort, no distress  Abdomen: soft, non-tender   Pelvic: examination not indicated  Extremities: no edema   Results for orders placed or performed in visit on 11/12/17 (from the past 24 hour(s))  POCT urine pregnancy   Collection Time: 11/12/17  9:55 AM  Result Value Ref Range   Preg Test, Ur Negative Negative    Assessment & Plan:  1) Contraception management> rx ortho evra patches  Meds:    Meds ordered this encounter  Medications  . norelgestromin-ethinyl estradiol (ORTHO EVRA) 150-35 MCG/24HR transdermal patch    Sig: Place 1 patch onto the skin once a week.    Dispense:  3 patch    Refill:  12    Order Specific Question:   Supervising Provider    Answer:   Duane Lope H [[id number]]    Orders Placed This Encounter  Procedures  . POCT urine pregnancy    Return for after 11/28 for , Pap & physical.  Cheral Marker CNM, [facility] 11/12/2017 10:24 AM

## 2017-11-12 NOTE — Patient Instructions (Signed)

## 2017-11-13 DIAGNOSIS — F4310 Post-traumatic stress disorder, unspecified: Secondary | ICD-10-CM | POA: Diagnosis not present

## 2017-11-19 DIAGNOSIS — F4310 Post-traumatic stress disorder, unspecified: Secondary | ICD-10-CM | POA: Diagnosis not present

## 2017-11-25 DIAGNOSIS — F4310 Post-traumatic stress disorder, unspecified: Secondary | ICD-10-CM | POA: Diagnosis not present

## 2017-11-27 DIAGNOSIS — F4310 Post-traumatic stress disorder, unspecified: Secondary | ICD-10-CM | POA: Diagnosis not present

## 2017-12-11 DIAGNOSIS — M722 Plantar fascial fibromatosis: Secondary | ICD-10-CM | POA: Diagnosis not present

## 2017-12-11 DIAGNOSIS — M79671 Pain in right foot: Secondary | ICD-10-CM | POA: Diagnosis not present

## 2018-01-29 DIAGNOSIS — F4310 Post-traumatic stress disorder, unspecified: Secondary | ICD-10-CM | POA: Diagnosis not present

## 2018-02-04 DIAGNOSIS — F4310 Post-traumatic stress disorder, unspecified: Secondary | ICD-10-CM | POA: Diagnosis not present

## 2018-03-11 DIAGNOSIS — F4310 Post-traumatic stress disorder, unspecified: Secondary | ICD-10-CM | POA: Diagnosis not present

## 2018-03-27 DIAGNOSIS — F4310 Post-traumatic stress disorder, unspecified: Secondary | ICD-10-CM | POA: Diagnosis not present

## 2018-10-17 ENCOUNTER — Other Ambulatory Visit: Payer: Self-pay

## 2018-10-17 DIAGNOSIS — Z20822 Contact with and (suspected) exposure to covid-19: Secondary | ICD-10-CM

## 2018-10-17 DIAGNOSIS — R6889 Other general symptoms and signs: Secondary | ICD-10-CM | POA: Diagnosis not present

## 2018-10-19 LAB — NOVEL CORONAVIRUS, NAA: SARS-CoV-2, NAA: NOT DETECTED

## 2018-10-20 ENCOUNTER — Telehealth: Payer: Self-pay | Admitting: General Practice

## 2018-10-20 NOTE — Telephone Encounter (Signed)
Negative COVID results given. Patient results "NOT Detected." Caller expressed understanding. ° °

## 2018-10-29 ENCOUNTER — Telehealth: Payer: Self-pay | Admitting: *Deleted

## 2018-10-29 ENCOUNTER — Other Ambulatory Visit: Payer: Self-pay

## 2018-10-29 DIAGNOSIS — Z20822 Contact with and (suspected) exposure to covid-19: Secondary | ICD-10-CM

## 2018-10-29 DIAGNOSIS — R6889 Other general symptoms and signs: Secondary | ICD-10-CM | POA: Diagnosis not present

## 2018-10-29 NOTE — Telephone Encounter (Signed)
Patient called regarding needing a refill on a medication but is unsure if she needs to be seen prior to it being refilled.

## 2018-10-29 NOTE — Telephone Encounter (Signed)
Left message @ 4:19 pm. JSY

## 2018-10-29 NOTE — Telephone Encounter (Signed)
Pt returning your call

## 2018-10-29 NOTE — Telephone Encounter (Signed)
Pt is needing a refill on her birth control patch. I advised she needs to schedule a physical. They are booking into December for physicals. Can you send in enough to last until her appt? Thanks! [place]

## 2018-10-30 ENCOUNTER — Other Ambulatory Visit: Payer: Self-pay | Admitting: Women's Health

## 2018-10-30 LAB — NOVEL CORONAVIRUS, NAA: SARS-CoV-2, NAA: NOT DETECTED

## 2018-10-30 MED ORDER — NORELGESTROMIN-ETH ESTRADIOL 150-35 MCG/24HR TD PTWK: 1.0000 | MEDICATED_PATCH | TRANSDERMAL | 0 refills | Status: DC

## 2018-12-15 ENCOUNTER — Other Ambulatory Visit: Payer: Self-pay

## 2018-12-15 DIAGNOSIS — Z20822 Contact with and (suspected) exposure to covid-19: Secondary | ICD-10-CM

## 2018-12-15 DIAGNOSIS — Z20828 Contact with and (suspected) exposure to other viral communicable diseases: Secondary | ICD-10-CM | POA: Diagnosis not present

## 2018-12-17 LAB — NOVEL CORONAVIRUS, NAA: SARS-CoV-2, NAA: NOT DETECTED

## 2019-01-05 ENCOUNTER — Other Ambulatory Visit: Payer: Medicaid Other | Admitting: Women's Health

## 2019-01-14 ENCOUNTER — Other Ambulatory Visit: Payer: Self-pay

## 2019-01-14 DIAGNOSIS — Z20822 Contact with and (suspected) exposure to covid-19: Secondary | ICD-10-CM

## 2019-01-14 DIAGNOSIS — Z20828 Contact with and (suspected) exposure to other viral communicable diseases: Secondary | ICD-10-CM | POA: Diagnosis not present

## 2019-01-16 LAB — SPECIMEN STATUS REPORT

## 2019-01-16 LAB — NOVEL CORONAVIRUS, NAA: SARS-CoV-2, NAA: DETECTED — AB

## 2019-03-10 ENCOUNTER — Ambulatory Visit (INDEPENDENT_AMBULATORY_CARE_PROVIDER_SITE_OTHER): Payer: Medicaid Other | Admitting: Advanced Practice Midwife

## 2019-03-10 ENCOUNTER — Encounter: Payer: Self-pay | Admitting: Advanced Practice Midwife

## 2019-03-10 ENCOUNTER — Other Ambulatory Visit (HOSPITAL_COMMUNITY)
Admission: RE | Admit: 2019-03-10 | Discharge: 2019-03-10 | Disposition: A | Discharge status: Home / Self Care | Payer: Medicaid Other | Source: Ambulatory Visit | Attending: Advanced Practice Midwife | Admitting: Advanced Practice Midwife

## 2019-03-10 ENCOUNTER — Other Ambulatory Visit: Payer: Self-pay

## 2019-03-10 VITALS — BP 126/66 | HR 79 | Ht 63.0 in | Wt 136.0 lb

## 2019-03-10 DIAGNOSIS — Z3201 Encounter for pregnancy test, result positive: Secondary | ICD-10-CM

## 2019-03-10 DIAGNOSIS — N926 Irregular menstruation, unspecified: Secondary | ICD-10-CM

## 2019-03-10 DIAGNOSIS — Z01419 Encounter for gynecological examination (general) (routine) without abnormal findings: Secondary | ICD-10-CM | POA: Diagnosis not present

## 2019-03-10 DIAGNOSIS — F419 Anxiety disorder, unspecified: Secondary | ICD-10-CM | POA: Diagnosis not present

## 2019-03-10 DIAGNOSIS — Z0000 Encounter for general adult medical examination without abnormal findings: Secondary | ICD-10-CM

## 2019-03-10 LAB — POCT URINE PREGNANCY: Preg Test, Ur: POSITIVE — AB

## 2019-03-10 NOTE — Progress Notes (Signed)
   WELL-WOMAN EXAMINATION Patient name: Mary Meyer MRN [id number]  Date of birth: 14-Feb-1991 Chief Complaint:   Gynecologic Exam  History of Present Illness:   Mary Meyer is a 29 y.o. G27P1011 Caucasian female being seen today for a routine well-woman exam.  Current complaints: no period since Oct/Nov  PCP: none      does not desire labs Patient's last menstrual period was 01/08/2019 (within days). The current method of family planning is none- stopped OCPs in the fall.  Last pap Nov 2018. Results were: normal. H/O abnormal pap: Yes- neg bx 2017 Last mammogram: n/a Last colonoscopy: n/a Review of Systems:   Pertinent items are noted in HPI Denies any headaches, blurred vision, fatigue, shortness of breath, chest pain, abdominal pain, abnormal vaginal discharge/itching/odor/irritation, problems with periods, bowel movements, urination, or intercourse unless otherwise stated above. Pertinent History Reviewed:  Reviewed past medical,surgical, social and family history.  Reviewed problem list, medications and allergies. Physical Assessment:   Vitals:   03/10/19 1127  BP: 126/66  Pulse: 79  Weight: 136 lb (61.7 kg)  Height: 5\' 3"  (1.6 m)  Body mass index is 24.09 kg/m.        Physical Examination:   General appearance - well appearing, and in no distress  Mental status - alert, oriented to person, place, and time  Psych:  She has a normal mood and affect  Skin - warm and dry, normal color, no suspicious lesions noted  Chest - effort normal, all lung fields clear to auscultation bilaterally  Heart - normal rate and regular rhythm  Neck:  midline trachea, no thyromegaly or nodules  Breasts - breasts appear normal, no suspicious masses, no skin or nipple changes or  axillary nodes  Abdomen - soft, nontender, nondistended, no masses or organomegaly  Pelvic - VULVA: normal appearing vulva with no masses, tenderness or lesions  VAGINA: normal appearing vagina with  normal color and discharge, no lesions  CERVIX: normal appearing cervix without discharge or lesions, no CMT  Thin prep pap is done with HR HPV cotesting  UTERUS: uterus is felt to be approx 9-10wks size; nontender   ADNEXA: No adnexal masses or tenderness noted.  Extremities:  No swelling or varicosities noted  Chaperone: Amanda Rash    Results for orders placed or performed in visit on 03/10/19 (from the past 24 hour(s))  POCT urine pregnancy   Collection Time: 03/10/19 11:36 AM  Result Value Ref Range   Preg Test, Ur Positive (A) Negative    Assessment & Plan:  1) Well-Woman Exam  2) Early preg> pt informed; surprised, but happy; will get U/S for [facility] and then set up NOB visit  Labs/procedures today: Pap  Mammogram age 49 or sooner if problems Colonoscopy age 25 or sooner if problems  Orders Placed This Encounter  Procedures  . POCT urine pregnancy    Meds: No orders of the defined types were placed in this encounter.   Follow-up: Return for Dating u/s, first available.  44 CNM 03/10/2019 11:52 AM

## 2019-03-11 ENCOUNTER — Other Ambulatory Visit: Payer: Self-pay | Admitting: Obstetrics and Gynecology

## 2019-03-11 DIAGNOSIS — O3680X0 Pregnancy with inconclusive fetal viability, not applicable or unspecified: Secondary | ICD-10-CM

## 2019-03-11 LAB — CYTOLOGY - PAP
Chlamydia: NEGATIVE
Comment: NEGATIVE
Comment: NEGATIVE
Comment: NORMAL
Diagnosis: NEGATIVE
High risk HPV: NEGATIVE
Neisseria Gonorrhea: NEGATIVE

## 2019-03-12 ENCOUNTER — Other Ambulatory Visit: Payer: Self-pay | Admitting: Obstetrics and Gynecology

## 2019-03-12 ENCOUNTER — Other Ambulatory Visit: Payer: Medicaid Other

## 2019-03-12 ENCOUNTER — Other Ambulatory Visit: Payer: Self-pay

## 2019-03-12 ENCOUNTER — Ambulatory Visit (INDEPENDENT_AMBULATORY_CARE_PROVIDER_SITE_OTHER): Payer: Medicaid Other

## 2019-03-12 DIAGNOSIS — Z3A01 Less than 8 weeks gestation of pregnancy: Secondary | ICD-10-CM

## 2019-03-12 DIAGNOSIS — Z3490 Encounter for supervision of normal pregnancy, unspecified, unspecified trimester: Secondary | ICD-10-CM | POA: Diagnosis not present

## 2019-03-12 DIAGNOSIS — O3680X0 Pregnancy with inconclusive fetal viability, not applicable or unspecified: Secondary | ICD-10-CM | POA: Diagnosis not present

## 2019-03-12 NOTE — Progress Notes (Signed)
[place] TA/TV:3.3 mm GS vs pseudosac,normal ovaries,small amount of simple cul de sac fluid,pt will have labs today and f/u ultrasound in 2 wks per Victorino Dike

## 2019-03-13 ENCOUNTER — Other Ambulatory Visit: Payer: Self-pay | Admitting: Adult Health

## 2019-03-13 DIAGNOSIS — Z3201 Encounter for pregnancy test, result positive: Secondary | ICD-10-CM

## 2019-03-13 LAB — BETA HCG QUANT (REF LAB): hCG Quant: 1212 m[IU]/mL

## 2019-03-26 ENCOUNTER — Other Ambulatory Visit: Payer: Self-pay | Admitting: Adult Health

## 2019-03-26 DIAGNOSIS — O3680X0 Pregnancy with inconclusive fetal viability, not applicable or unspecified: Secondary | ICD-10-CM

## 2019-03-27 ENCOUNTER — Ambulatory Visit (INDEPENDENT_AMBULATORY_CARE_PROVIDER_SITE_OTHER): Payer: Medicaid Other

## 2019-03-27 ENCOUNTER — Other Ambulatory Visit: Payer: Self-pay | Admitting: Adult Health

## 2019-03-27 ENCOUNTER — Other Ambulatory Visit: Payer: Self-pay

## 2019-03-27 DIAGNOSIS — O3680X0 Pregnancy with inconclusive fetal viability, not applicable or unspecified: Secondary | ICD-10-CM | POA: Diagnosis not present

## 2019-03-27 DIAGNOSIS — Z3A01 Less than 8 weeks gestation of pregnancy: Secondary | ICD-10-CM

## 2019-03-27 NOTE — Progress Notes (Signed)
[place] TA/TV:6+1 wks,single IUP w/ys,CRL 4.48 mm,fetal heart tones were visualized (bradycardia),unable to measure heart rate with M mode,normal ovaries,pt.will come back for f/u ultrasound in 10 days.

## 2019-04-07 ENCOUNTER — Other Ambulatory Visit: Payer: Self-pay | Admitting: Adult Health

## 2019-04-07 DIAGNOSIS — O3680X0 Pregnancy with inconclusive fetal viability, not applicable or unspecified: Secondary | ICD-10-CM

## 2019-04-08 ENCOUNTER — Other Ambulatory Visit: Payer: Self-pay

## 2019-04-08 ENCOUNTER — Ambulatory Visit (INDEPENDENT_AMBULATORY_CARE_PROVIDER_SITE_OTHER): Payer: Medicaid Other | Admitting: Adult Health

## 2019-04-08 ENCOUNTER — Ambulatory Visit (INDEPENDENT_AMBULATORY_CARE_PROVIDER_SITE_OTHER): Payer: Medicaid Other

## 2019-04-08 ENCOUNTER — Encounter: Payer: Self-pay | Admitting: Adult Health

## 2019-04-08 VITALS — BP 132/84 | HR 104 | Ht 63.0 in | Wt 139.0 lb

## 2019-04-08 DIAGNOSIS — Z3A01 Less than 8 weeks gestation of pregnancy: Secondary | ICD-10-CM | POA: Diagnosis not present

## 2019-04-08 DIAGNOSIS — R58 Hemorrhage, not elsewhere classified: Secondary | ICD-10-CM

## 2019-04-08 DIAGNOSIS — O039 Complete or unspecified spontaneous abortion without complication: Secondary | ICD-10-CM

## 2019-04-08 DIAGNOSIS — O3680X0 Pregnancy with inconclusive fetal viability, not applicable or unspecified: Secondary | ICD-10-CM

## 2019-04-08 HISTORY — DX: Hemorrhage, not elsewhere classified: R58

## 2019-04-08 NOTE — Progress Notes (Signed)
  Subjective:     Patient ID: Mary Meyer, female   DOB: 01/30/1991, 29 y.o.   MRN: [id number]  HPI Mary Meyer is a 29 year old white female, married here today for follow up US, had bradycardia 03/27/19 and has started bleeding and cramping some.   Review of Systems Started bleeding and cramping today Reviewed past medical,surgical, social and family history. Reviewed medications and allergies.     Objective:   Physical Exam BP 132/84 (BP Location: Left Arm, Patient Position: Sitting, Cuff Size: Normal)   Pulse (!) 104   Ht 5\' 3"  (1.6 m)   Wt 139 lb (63 kg)   LMP  (LMP Unknown)   BMI 24.62 kg/m  Skin warm and dry. Lungs: clear to ausculation bilaterally. Cardiovascular: regular rate and rhythm. showed no FHM, CRL 5+5 weeks, was 30+1 on 03/27/19. QHCG was 1212 on 03/12/19.She says she felt something was not right from the beginning. Had miscarriage in 2018.    Blood type is O+ Assessment:     1. Miscarriage   2. Bleeding     Plan:     She wants to do expectant management Will check [facility] at follow up and start birth control soon there after Follow up 04/24/19

## 2019-04-08 NOTE — Progress Notes (Signed)
[place] 5+5 wks,single IUP,CRL 2.6 mm,no FHT,normal ovaries,discussed results with Victorino Dike

## 2019-04-24 ENCOUNTER — Encounter: Payer: Self-pay | Admitting: Adult Health

## 2019-04-24 ENCOUNTER — Ambulatory Visit (INDEPENDENT_AMBULATORY_CARE_PROVIDER_SITE_OTHER): Payer: Medicaid Other | Admitting: Adult Health

## 2019-04-24 ENCOUNTER — Other Ambulatory Visit: Payer: Self-pay

## 2019-04-24 VITALS — BP 136/86 | HR 95 | Ht 63.0 in | Wt 138.0 lb

## 2019-04-24 DIAGNOSIS — O039 Complete or unspecified spontaneous abortion without complication: Secondary | ICD-10-CM | POA: Diagnosis not present

## 2019-04-24 DIAGNOSIS — Z0000 Encounter for general adult medical examination without abnormal findings: Secondary | ICD-10-CM | POA: Insufficient documentation

## 2019-04-24 DIAGNOSIS — Z30016 Encounter for initial prescription of transdermal patch hormonal contraceptive device: Secondary | ICD-10-CM

## 2019-04-24 DIAGNOSIS — F329 Major depressive disorder, single episode, unspecified: Secondary | ICD-10-CM

## 2019-04-24 DIAGNOSIS — F32A Depression, unspecified: Secondary | ICD-10-CM

## 2019-04-24 MED ORDER — NORELGESTROMIN-ETH ESTRADIOL 150-35 MCG/24HR TD PTWK: 1.0000 | MEDICATED_PATCH | TRANSDERMAL | 12 refills | Status: DC

## 2019-04-24 NOTE — Progress Notes (Signed)
  Subjective:     Patient ID: Almyra Free, female   DOB: 10-29-1990, 29 y.o.   MRN: [id number]  HPI Delsie is a 29 year old white female, married back in follow up of recent miscarriage, has passed tissue and bleeding has stopped.   Review of Systems No bleeding now Some occasional cramps  Reviewed past medical,surgical, social and family history. Reviewed medications and allergies.     Objective:   Physical Exam  BP 136/86 (BP Location: Left Arm, Patient Position: Sitting, Cuff Size: Normal)   Pulse 95   Ht 5\' 3"  (1.6 m)   Wt 138 lb (62.6 kg)   LMP  (LMP Unknown) Comment: recent miscarriage   Breastfeeding No   BMI 24.45 kg/m   Skin warm and dry. Lungs: clear to ausculation bilaterally. Cardiovascular: regular rate and rhythm. PHQ 9 score is 14, denies being suicidal and will think about meds, she went to [facility] over a year ago. She wants to use the patch for birth control.    Assessment:     1. Miscarriage Check QHCG  2. Encounter for initial prescription of transdermal patch hormonal contraceptive device Will rx patch but will let her know when to start after [facility] back Meds ordered this encounter  Medications  . norelgestromin-ethinyl estradiol (ORTHO EVRA) 150-35 MCG/24HR transdermal patch    Sig: Place 1 patch onto the skin once a week.    Dispense:  3 patch    Refill:  12    Order Specific Question:   Supervising Provider    Answer:   [facility], [facility] H [[id number]]    3. Depression, unspecified depression type She will think about meds and let me know    Plan:     Follow up in 3 months or sooner if needed

## 2019-04-25 LAB — BETA HCG QUANT (REF LAB): hCG Quant: 3 m[IU]/mL

## 2019-07-03 ENCOUNTER — Ambulatory Visit: Payer: Medicaid Other | Admitting: Adult Health

## 2019-07-10 ENCOUNTER — Encounter: Payer: Self-pay | Admitting: Adult Health

## 2019-07-10 ENCOUNTER — Ambulatory Visit (INDEPENDENT_AMBULATORY_CARE_PROVIDER_SITE_OTHER): Payer: Medicaid Other | Admitting: Adult Health

## 2019-07-10 ENCOUNTER — Other Ambulatory Visit: Payer: Self-pay

## 2019-07-10 VITALS — BP 114/77 | HR 72 | Ht 63.0 in | Wt 133.0 lb

## 2019-07-10 DIAGNOSIS — R8290 Unspecified abnormal findings in urine: Secondary | ICD-10-CM | POA: Diagnosis not present

## 2019-07-10 DIAGNOSIS — N390 Urinary tract infection, site not specified: Secondary | ICD-10-CM | POA: Insufficient documentation

## 2019-07-10 DIAGNOSIS — R300 Dysuria: Secondary | ICD-10-CM | POA: Diagnosis not present

## 2019-07-10 HISTORY — DX: Urinary tract infection, site not specified: N39.0

## 2019-07-10 LAB — POCT URINALYSIS DIPSTICK
Glucose, UA: NEGATIVE
Ketones, UA: NEGATIVE
Leukocytes, UA: NEGATIVE
Nitrite, UA: NEGATIVE
Protein, UA: NEGATIVE

## 2019-07-10 MED ORDER — SULFAMETHOXAZOLE-TRIMETHOPRIM 800-160 MG PO TABS: 1.0000 | ORAL_TABLET | Freq: Two times a day (BID) | ORAL | 0 refills | Status: DC

## 2019-07-10 NOTE — Progress Notes (Signed)
  Subjective:     Patient ID: Mary Meyer, female   DOB: 1990-12-28, 29 y.o.   MRN: [id number]  HPI Mary Meyer is a 29 year old white female,single, G3P1011, in complaining of odor in urine and burning with urination for few weeks now. Smells like somehing burning.   Review of Systems +odor in urine +burning with urination Some low back pain  Reviewed past medical,surgical, social and family history. Reviewed medications and allergies.      Objective:   Physical Exam BP 114/77 (BP Location: Left Arm, Patient Position: Sitting, Cuff Size: Normal)   Pulse 72   Ht 5\' 3"  (1.6 m)   Wt 133 lb (60.3 kg)   LMP 06/15/2019   BMI 23.56 kg/m urine dipstick trace blood. Skin warm and dry. Lungs: clear to ausculation bilaterally. Cardiovascular: regular rate and rhythm. No bladder tenderness or CVAT    Assessment:     1. Burning with urination Increase water   2. Urinary tract infection without hematuria, site unspecified Will rx septra ds Meds ordered this encounter  Medications  . sulfamethoxazole-trimethoprim (BACTRIM DS) 800-160 MG tablet    Sig: Take 1 tablet by mouth 2 (two) times daily. Take 1 bid    Dispense:  14 tablet    Refill:  0    Order Specific Question:   Supervising Provider    Answer:   06/17/2019, LUTHER H [[id number]]  UA C&S sent  3. Abnormal urine odor     Plan:     Has appt 5/26 for physical

## 2019-07-11 LAB — URINALYSIS, ROUTINE W REFLEX MICROSCOPIC
Bilirubin, UA: NEGATIVE
Glucose, UA: NEGATIVE
Ketones, UA: NEGATIVE
Nitrite, UA: NEGATIVE
Protein,UA: NEGATIVE
Specific Gravity, UA: 1.02 (ref 1.005–1.030)
Urobilinogen, Ur: 0.2 mg/dL (ref 0.2–1.0)
pH, UA: 7 (ref 5.0–7.5)

## 2019-07-11 LAB — MICROSCOPIC EXAMINATION
Casts: NONE SEEN /lpf
RBC, Urine: NONE SEEN /hpf (ref 0–2)

## 2019-07-12 LAB — URINE CULTURE

## 2019-07-22 ENCOUNTER — Ambulatory Visit: Payer: Medicaid Other | Admitting: Adult Health

## 2019-08-14 ENCOUNTER — Ambulatory Visit (INDEPENDENT_AMBULATORY_CARE_PROVIDER_SITE_OTHER): Payer: Medicaid Other | Admitting: Adult Health

## 2019-08-14 ENCOUNTER — Encounter: Payer: Self-pay | Admitting: Adult Health

## 2019-08-14 VITALS — BP 118/81 | HR 68 | Ht 63.0 in | Wt 133.0 lb

## 2019-08-14 DIAGNOSIS — R8290 Unspecified abnormal findings in urine: Secondary | ICD-10-CM

## 2019-08-14 DIAGNOSIS — Z87440 Personal history of urinary (tract) infections: Secondary | ICD-10-CM | POA: Diagnosis not present

## 2019-08-14 LAB — POCT URINALYSIS DIPSTICK
Glucose, UA: NEGATIVE
Ketones, UA: NEGATIVE
Leukocytes, UA: NEGATIVE
Nitrite, UA: NEGATIVE
Protein, UA: NEGATIVE

## 2019-08-14 MED ORDER — METRONIDAZOLE 0.75 % VA GEL: 1.0000 | Freq: Every day | VAGINAL | 0 refills | Status: DC

## 2019-08-14 NOTE — Progress Notes (Signed)
  Subjective:     Patient ID: Mary Meyer, female   DOB: [birth year], 29 y.o.   MRN: [id number]  HPI  Mary Meyer is a 29 year old white female, G3P1011 in 29 complaining of odor when pees, it is her pee she says, no pain.   Review of Systems Has smell when pees, like something burning, husband can not smell it Denies any pain Reviewed past medical,surgical, social and family history. Reviewed medications and allergies.     Objective:   Physical Exam BP 118/81 (BP Location: Left Arm, Patient Position: Sitting, Cuff Size: Normal)   Pulse 68   Ht 5\' 3"  (1.6 m)   Wt 133 lb (60.3 kg)   BMI 23.56 kg/m urine dipstick trace blood, clear, no odor, on period now Skin warm and dry.  Lungs: clear to ausculation bilaterally. Cardiovascular: regular rate and rhythm.no tenderness over bladder.     Assessment:     1. History of UTI  2. Abnormal urine odor Will try metrogel Meds ordered this encounter  Medications  . metroNIDAZOLE (METROGEL VAGINAL) 0.75 % vaginal gel    Sig: Place 1 Applicatorful vaginally at bedtime.    Dispense:  70 g    Refill:  0    Order Specific Question:   Supervising Provider    Answer:   [[id number]]      Plan:     Call me if follow up on odor after using metrogel  Follow up prn

## 2019-10-16 ENCOUNTER — Telehealth: Payer: Self-pay | Admitting: Adult Health

## 2019-10-16 MED ORDER — NORETHIN ACE-ETH ESTRAD-FE 1-20 MG-MCG PO TABS
1.0000 | ORAL_TABLET | Freq: Every day | ORAL | 11 refills | Status: DC
Start: 2019-10-16 — End: 2020-09-14

## 2019-10-16 NOTE — Telephone Encounter (Signed)
Pt aware that junel rx sent, can start today, use condoms for 1 pack

## 2019-10-16 NOTE — Addendum Note (Signed)
Addended by: Cyril Mourning A on: 10/16/2019 12:45 PM   Modules accepted: Orders

## 2019-10-16 NOTE — Telephone Encounter (Signed)
Called patient back, states that the patch isn't working well for her because it doesn't stick to her. She would like to go back on the pill. She took it before her last pregnancy. She doesn't remember which one, was prescribed by a doctor at [facility]. Advised patient I would route message to Hilda and we would let her know if we could send in a rx.

## 2019-10-16 NOTE — Telephone Encounter (Signed)
Please call pt she is wanting to switch birth control and wanted to see if she could just do that by phone/ she is not liking the patch

## 2020-05-02 ENCOUNTER — Encounter: Payer: Self-pay | Admitting: Internal Medicine

## 2020-05-02 ENCOUNTER — Ambulatory Visit: Payer: Medicaid Other | Admitting: Internal Medicine

## 2020-05-02 ENCOUNTER — Other Ambulatory Visit: Payer: Self-pay

## 2020-05-02 VITALS — BP 127/81 | HR 79 | Wt 135.6 lb

## 2020-05-02 DIAGNOSIS — Z0000 Encounter for general adult medical examination without abnormal findings: Secondary | ICD-10-CM

## 2020-05-02 DIAGNOSIS — F419 Anxiety disorder, unspecified: Secondary | ICD-10-CM | POA: Diagnosis present

## 2020-05-02 DIAGNOSIS — F32A Depression, unspecified: Secondary | ICD-10-CM

## 2020-05-02 DIAGNOSIS — Z720 Tobacco use: Secondary | ICD-10-CM | POA: Insufficient documentation

## 2020-05-02 MED ORDER — ESCITALOPRAM OXALATE 10 MG PO TABS: 10.0000 mg | ORAL_TABLET | Freq: Every day | ORAL | 2 refills | Status: DC

## 2020-05-02 NOTE — Progress Notes (Signed)
   CC: tobacco use   HPI:  [name] is a 30 y.o. with PMH as below.   Please see A&P for assessment of the patient's acute and chronic medical conditions.     PMH:  None  Medications: Melatonin Loestrin birth control   Social History:  Lives in [place]  Stay at home mom  Smokes 4 cigarettes per day - recently cut back. Was 1 ppd for about 15 years.  Two beers 1-2 times per week  No recreational drug use   Gestational History: 1 son  Two miscarriage were very early  On loestrin birth control    Past Medical History:  Diagnosis Date  . Anxiety   . Asthma   . 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 Bleeding 04/08/2019  . Gestational edema, postpartum 10/12/2014  . Miscarriage 04/16/2016   04/16/16   . Previous cesarean section 12/15/2015  . S/P radiofrequency ablation operation for arrhythmia 04/26/2014  . Tachycardia   . Tachycardia    SVT:  ablation age 74.  Pt states she had a stent placed during the cath/ablation, but records don't reflect this.  Cards appt 04/26/14 @ [facility]: EKG & Holter monitor all returned normal per pt, no further f/u needed per cards   . Urinary tract infection without hematuria 07/10/2019  . Vaginal Pap smear, abnormal    Review of Systems:   Review of Systems  Constitutional: Negative for chills, fever and weight loss.  Respiratory: Negative for cough, shortness of breath and wheezing.   Cardiovascular: Negative for chest pain and leg swelling.  Gastrointestinal: Negative for abdominal pain, constipation, diarrhea, heartburn and nausea.  Genitourinary: Negative for dysuria, frequency and urgency.  Psychiatric/Behavioral: Positive for depression. Negative for hallucinations, substance abuse and suicidal ideas. The patient is nervous/anxious.      Physical Exam:  Constitution: NAD, appears stated age HENT: [place]/AT Eyes: no icterus or injection  Cardio: RRR, no m/r/g, no LE edema  Respiratory: CTA, no  w/r/r Abdominal: NTTP, soft, non-distended MSK: moving all extremities Neuro: normal affect, a&ox3 Skin: c/d/i    Vitals:   05/02/20 1509  BP: 127/81  Pulse: 79  SpO2: 100%  Weight: 135 lb 9.6 oz (61.5 kg)     Assessment & Plan:   See Encounters Tab for problem based charting.  Patient discussed with Dr. Cleda Daub

## 2020-05-02 NOTE — Assessment & Plan Note (Signed)
She has cut back significantly and is smoking 4 cigarettes per day. Discussed medication and nicotine patches and she would like to think about this.   - discuss at follow-up  - lipid panel

## 2020-05-02 NOTE — Assessment & Plan Note (Signed)
PHQ-9 11. GAD 16. Used to see a psychiatrist and therapist for depression twice a week but has not seen anyone in a long time. She's been without a license for two years. This made her depression and anxiety worse cause she's stuck in the house. She is getting them back soon. She doesn't want to do anything due to her depression, just wants to sit in front of TV. She feels like she's paranoid all the time and worries a lot about random things. For example, when her husband leaves for work in the morning, she is sometimes worried she might stop breathing if she goes back to sleep or that something bad will happen to her or her family. No SI.   Previously on anxiety and sleeping medication but cannot remember what they and what helped except for trazadone 100 mg. Per chart review it appears she has been on buspar, lexapro, hydroxyzine, sertraline, trazdone, and zolpidem.   - start lexapro 10 mg tablet daily - discussed side effects and that it can take up to four weeks to take effect - referral to behavioral health  - f/u in four weeks to assess efficacy

## 2020-05-02 NOTE — Assessment & Plan Note (Signed)
See depression

## 2020-05-02 NOTE — Patient Instructions (Signed)
Thank you for allowing [place] to provide your care today. Today we discussed your depression and anxiety and tobacco use  I have ordered the following labs for you:  Basic metabolic panel, hemoglobin a1c, and lipid panel    I will call if any are abnormal.    Today we made the following changes to your medications:   Please START taking  Lexapro 10 mg - take one tablet per day   Please follow-up in one month to assess depression and anxiety.    Please call the internal medicine center clinic if you have any questions or concerns, we may be able to help and keep you from a long and expensive emergency room wait. Our clinic and after hours phone number is [phone], the best time to call is Monday through Friday 9 am to 4 pm but there is always someone available 24/7 if you have an emergency. If you need medication refills please notify your pharmacy one week in advance and they will send [place] a request.

## 2020-05-02 NOTE — Assessment & Plan Note (Signed)
She would like screening labs today.   - lipid panel, hemoglobin a1c, BMP  - she is not interested in covid vaccinations

## 2020-05-03 LAB — BMP8+ANION GAP
Anion Gap: 19 mmol/L — ABNORMAL HIGH (ref 10.0–18.0)
BUN/Creatinine Ratio: 17 (ref 9–23)
BUN: 14 mg/dL (ref 6–20)
CO2: 21 mmol/L (ref 20–29)
Calcium: 9.6 mg/dL (ref 8.7–10.2)
Chloride: 103 mmol/L (ref 96–106)
Creatinine, Ser: 0.81 mg/dL (ref 0.57–1.00)
Glucose: 96 mg/dL (ref 65–99)
Potassium: 4.7 mmol/L (ref 3.5–5.2)
Sodium: 143 mmol/L (ref 134–144)
eGFR: 100 mL/min/{1.73_m2} (ref >59)

## 2020-05-03 LAB — HEMOGLOBIN A1C
Est. average glucose Bld gHb Est-mCnc: 94 mg/dL
Hgb A1c MFr Bld: 4.9 % (ref 4.8–5.6)

## 2020-05-03 LAB — LIPID PANEL
Chol/HDL Ratio: 3.4 ratio (ref 0.0–4.4)
Cholesterol, Total: 196 mg/dL (ref 100–199)
HDL: 57 mg/dL (ref >39)
LDL Chol Calc (NIH): 116 mg/dL — ABNORMAL HIGH (ref 0–99)
Triglycerides: 128 mg/dL (ref 0–149)
VLDL Cholesterol Cal: 23 mg/dL (ref 5–40)

## 2020-05-03 NOTE — Progress Notes (Signed)
Internal Medicine Clinic Attending  Case discussed with Dr. Seawell  At the time of the visit.  We reviewed the resident's history and exam and pertinent patient test results.  I agree with the assessment, diagnosis, and plan of care documented in the resident's note.  

## 2020-05-26 ENCOUNTER — Institutional Professional Consult (permissible substitution): Payer: Medicaid Other | Admitting: Behavioral Health

## 2020-05-31 ENCOUNTER — Other Ambulatory Visit: Payer: Self-pay

## 2020-05-31 ENCOUNTER — Ambulatory Visit: Payer: Medicaid Other | Admitting: Behavioral Health

## 2020-05-31 DIAGNOSIS — F1010 Alcohol abuse, uncomplicated: Secondary | ICD-10-CM

## 2020-05-31 DIAGNOSIS — F329 Major depressive disorder, single episode, unspecified: Secondary | ICD-10-CM

## 2020-05-31 DIAGNOSIS — F419 Anxiety disorder, unspecified: Secondary | ICD-10-CM

## 2020-05-31 NOTE — BH Specialist Note (Signed)
Integrated Behavioral Health via Telemedicine Visit  05/31/2020 Mary Meyer [id number]  Number of Integrated Behavioral Health visits: 1/6 Session Start time: 1:00pm  Session End time: 1:50pm Total time: 50   Referring Provider: Dr. Guinevere Scarlet, MD  Patient/Family location: Pt in private at home w/her 31yo Son [facility] Provider location: [facility] All persons participating in visit: Pt & Clinician Types of Service: Individual psychotherapy  I connected with Deneen Harts Menchaca and/or Deneen Harts Barnaby's self via  Telephone or Video Enabled Telemedicine Application  (Video is Caregility application) and verified that I am speaking with the correct person using two identifiers. Discussed confidentiality: Yes   I discussed the limitations of telemedicine and the availability of in person appointments.  Discussed there is a possibility of technology failure and discussed alternative modes of communication if that failure occurs.  I discussed that engaging in this telemedicine visit, they consent to the provision of behavioral healthcare and the services will be billed under their insurance.  Patient and/or legal guardian expressed understanding and consented to Telemedicine visit: Yes   Presenting Concerns: Patient and/or family reports the following symptoms/concerns: elevated anx/dep Duration of problem: ~ 3 yrs since her traffic conviction, anx/dep, OCD & PTSD Dx; Severity of problem: moderate to moderately severe  Patient and/or Family's Strengths/Protective Factors: Social connections, Social and Emotional competence, Concrete supports in place (healthy food, safe environments, etc.) and Sense of purpose  Goals Addressed: Patient will: 1.  Reduce symptoms of: anx/dep/obsessive beh/PTSD Sx 2.  Increase knowledge and/or ability of: coping skills, healthy habits, self-management skills, stress reduction and self care practices  3.  Demonstrate ability to: Increase healthy  adjustment to current life circumstances, Increase motivation to adhere to plan of care and Decrease self-medicating behaviors  Progress towards Goals: Estb'd today: Pt will moderate drinking by half, Pt will purchase a small notebook to track her progress daily  Interventions: Interventions utilized:  Motivational Interviewing, Behavioral Activation and Supportive Counseling Standardized Assessments completed: f/u with screeners prn  Patient and/or Family Response: Pt receptive to call today & agrees to future appt  Assessment: Patient currently experiencing elevated Sx of anx/dep & cravings for ETOH.   Patient may benefit from moderation of consumption as first step. Accountability to Self, Fiance, Son, & Clinician.   Plan: 1. Follow up with behavioral health clinician on : 2 wks on telehealth for 60 min 2. Behavioral recommendations: Take notes on POA, do not purchase 12 paks, cont commitment to not drink & drive 3. Referral(s): Integrated [employer] (In Clinic) and Offered AA Mtg as next step  I discussed the assessment and treatment plan with the patient and/or parent/guardian. They were provided an opportunity to ask questions and all were answered. They agreed with the plan and demonstrated an understanding of the instructions.   They were advised to call back or seek an in-person evaluation if the symptoms worsen or if the condition fails to improve as anticipated.  Deneise Lever, LMFT

## 2020-06-09 ENCOUNTER — Other Ambulatory Visit: Payer: Self-pay

## 2020-06-09 ENCOUNTER — Ambulatory Visit: Payer: Medicaid Other | Admitting: Internal Medicine

## 2020-06-09 ENCOUNTER — Encounter: Payer: Self-pay | Admitting: Internal Medicine

## 2020-06-09 VITALS — BP 131/94 | HR 66 | Temp 99.2°F | Ht 63.0 in | Wt 136.9 lb

## 2020-06-09 DIAGNOSIS — F32A Depression, unspecified: Secondary | ICD-10-CM | POA: Diagnosis present

## 2020-06-09 MED ORDER — DULOXETINE HCL 20 MG PO CPEP: 40.0000 mg | ORAL_CAPSULE | Freq: Every day | ORAL | 0 refills | Status: DC

## 2020-06-09 NOTE — Progress Notes (Signed)
CC: Depression  HPI:  [name] is a 30 y.o. female with a past medical history stated below and presents today for depression. Please see problem based assessment and plan for additional details.  Past Medical History:  Diagnosis Date  . Anxiety   . Asthma   . 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 Bleeding 04/08/2019  . Gestational edema, postpartum 10/12/2014  . Miscarriage 04/16/2016   04/16/16   . Previous cesarean section 12/15/2015  . S/P radiofrequency ablation operation for arrhythmia 04/26/2014  . Tachycardia   . Tachycardia    SVT:  ablation age 63.  Pt states she had a stent placed during the cath/ablation, but records don't reflect this.  Cards appt 04/26/14 @ [facility]: EKG & Holter monitor all returned normal per pt, no further f/u needed per cards   . Urinary tract infection without hematuria 07/10/2019  . Vaginal Pap smear, abnormal     Current Outpatient Medications on File Prior to Visit  Medication Sig Dispense Refill  . metroNIDAZOLE (METROGEL VAGINAL) 0.75 % vaginal gel Place 1 Applicatorful vaginally at bedtime. 70 g 0  . norethindrone-ethinyl estradiol (LOESTRIN FE) 1-20 MG-MCG tablet Take 1 tablet by mouth daily. 28 tablet 11   No current facility-administered medications on file prior to visit.    Family History  Problem Relation Age of Onset  . Thyroid disease Maternal Aunt   . Heart Problems Maternal Aunt   . Cancer Maternal Grandmother   . Other Maternal Grandmother        brain tumor  . Cancer Maternal Grandfather     Social History   Socioeconomic History  . Marital status: Single    Spouse name: Not on file  . Number of children: 1  . Years of education: Not on file  . Highest education level: Not on file  Occupational History  . Not on file  Tobacco Use  . Smoking status: Current Every Day Smoker    Packs/day: 0.00    Years: 10.00    Pack years: 0.00    Types: Cigarettes  . Smokeless tobacco:  Never Used  . Tobacco comment: smokes 4-5  cig daily  Vaping Use  . Vaping Use: Never used  Substance and Sexual Activity  . Alcohol use: Yes    Alcohol/week: 0.0 standard drinks    Comment: occ   . Drug use: No  . Sexual activity: Yes    Birth control/protection: Patch  Other Topics Concern  . Not on file  Social History Narrative  . Not on file   Social Determinants of Health   Financial Resource Strain: Not on file  Food Insecurity: Not on file  Transportation Needs: Not on file  Physical Activity: Not on file  Stress: Not on file  Social Connections: Not on file  Intimate Partner Violence: Not on file    Review of Systems: ROS negative except for what is noted on the assessment and plan.  Vitals:   06/09/20 1318  BP: (!) 131/94  Pulse: 66  Temp: 99.2 F (37.3 C)  TempSrc: Oral  SpO2: 100%  Weight: 136 lb 14.4 oz (62.1 kg)  Height: 5\' 3"  (1.6 m)    Physical Exam: Gen: A&O x3 and in no apparent distress, well appearing and nourished. HEENT: Head - normocephalic, atraumatic. Eye -  visual acuity grossly intact, conjunctiva clear, sclera non-icteric, EOM intact. Mouth - No obvious caries or periodontal disease. Neck: no obvious masses or nodules, AROM intact.  CV: RRR, no murmurs, rubs, or gallops. S1/S2 presents  Resp: Clear to ascultation bilaterally  Abd: BS (+) x4, soft, non-tender, without obvious hepatosplenomegaly or masses MSK: Grossly normal AROM and strength x4 extremities. Skin: good skin turgor, no rashes, unusual bruising, or prominent lesions.  Neuro: No focal deficits, grossly normal sensation and coordination.  Psych: Oriented x3 and responding appropriately. Intact recent and remote memory, normal mood, judgement, affect , and insight.    Assessment & Plan:   See Encounters Tab for problem based charting.  Patient discussed with Dr. Janeece Agee, D.O. [facility], PGY-2 Pager: [phone], Phone:  [phone] Date 06/11/2020 Time 6:47 AM

## 2020-06-09 NOTE — Patient Instructions (Signed)
Thank you, Ms.Tanesha M Gillean for allowing [place] to provide your care today. Today we discussed Depression and anxiety.    I have ordered the following labs for you:  Lab Orders  No laboratory test(s) ordered today     Tests ordered today:  none  Referrals ordered today:   Referral Orders  No referral(s) requested today     Medication Changes:   Medications Discontinued During This Encounter  Medication Reason  . escitalopram (LEXAPRO) 10 MG tablet      Meds ordered this encounter  Medications  . DULoxetine (CYMBALTA) 20 MG capsule    Sig: Take 2 capsules (40 mg total) by mouth daily.    Dispense:  60 capsule    Refill:  0     Instructions:   Follow up: 1 month   Remember:    Should you have any questions or concerns please call the internal medicine clinic at [phone].     Dellia Cloud, D.O. [facility]

## 2020-06-11 ENCOUNTER — Encounter: Payer: Self-pay | Admitting: Internal Medicine

## 2020-06-11 NOTE — Assessment & Plan Note (Addendum)
Patient states that the lexapro worked initially but felt like it ws not working after a couple of weeks. She also developed a significant side effect. She began to crave ETOH more and was drinking upwards of 12 beers at least twice per week and on the weekends which is abnormal for her. She denies any withdrawal symptoms. I counseled her that this could be a rare side effect of the medication but we will need to keep a close eye on her ETOH use.    Plan: - Switch to cymbalta. Will titrate up as needed.

## 2020-06-13 NOTE — Progress Notes (Signed)
Internal Medicine Clinic Attending  Case discussed with Dr. Coe  At the time of the visit.  We reviewed the resident's history and exam and pertinent patient test results.  I agree with the assessment, diagnosis, and plan of care documented in the resident's note.  

## 2020-06-22 ENCOUNTER — Other Ambulatory Visit: Payer: Self-pay

## 2020-06-22 ENCOUNTER — Ambulatory Visit: Payer: Medicaid Other | Admitting: Behavioral Health

## 2020-06-22 ENCOUNTER — Telehealth: Payer: Self-pay | Admitting: Behavioral Health

## 2020-06-22 NOTE — Telephone Encounter (Signed)
Contacted Pt twice, securing her on second call. Pt started School late today would usually be available at this time but forgot to call & r/s. Pt requests future appt.  Dr. Monna Fam

## 2020-07-07 ENCOUNTER — Encounter: Payer: Medicaid Other | Admitting: Internal Medicine

## 2020-07-18 ENCOUNTER — Ambulatory Visit: Payer: Medicaid Other | Admitting: Internal Medicine

## 2020-07-18 ENCOUNTER — Encounter: Payer: Self-pay | Admitting: Internal Medicine

## 2020-07-18 DIAGNOSIS — F329 Major depressive disorder, single episode, unspecified: Secondary | ICD-10-CM

## 2020-07-18 DIAGNOSIS — L282 Other prurigo: Secondary | ICD-10-CM | POA: Insufficient documentation

## 2020-07-18 DIAGNOSIS — F1010 Alcohol abuse, uncomplicated: Secondary | ICD-10-CM | POA: Diagnosis not present

## 2020-07-18 DIAGNOSIS — L219 Seborrheic dermatitis, unspecified: Secondary | ICD-10-CM | POA: Insufficient documentation

## 2020-07-18 DIAGNOSIS — Z7289 Other problems related to lifestyle: Secondary | ICD-10-CM

## 2020-07-18 HISTORY — DX: Alcohol abuse, uncomplicated: F10.10

## 2020-07-18 MED ORDER — HYDROCORTISONE 2.5 % EX LOTN: TOPICAL_LOTION | CUTANEOUS | 1 refills | Status: DC

## 2020-07-18 MED ORDER — KETOCONAZOLE 2 % EX SHAM: 1.0000 "application " | MEDICATED_SHAMPOO | CUTANEOUS | 0 refills | Status: DC

## 2020-07-18 MED ORDER — ESCITALOPRAM OXALATE 10 MG PO TABS: 10.0000 mg | ORAL_TABLET | Freq: Every day | ORAL | 2 refills | Status: DC

## 2020-07-18 NOTE — Progress Notes (Signed)
   CC: Depression, hypertension, hair issues, tick bite  HPI: [name] is a 30 y.o. with a history listed below presenting for follow-up of her depression, hypertension,.  Hair issues, and tick bite    Past Medical History:  Diagnosis Date  . Anxiety   . Asthma   . 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 Bleeding 04/08/2019  . Gestational edema, postpartum 10/12/2014  . Miscarriage 04/16/2016   04/16/16   . Previous cesarean section 12/15/2015  . S/P radiofrequency ablation operation for arrhythmia 04/26/2014  . Tachycardia   . Tachycardia    SVT:  ablation age 35.  Pt states she had a stent placed during the cath/ablation, but records don't reflect this.  Cards appt 04/26/14 @ [facility]: EKG & Holter monitor all returned normal per pt, no further f/u needed per cards   . Urinary tract infection without hematuria 07/10/2019  . Vaginal Pap smear, abnormal    Review of Systems:   Constitutional: Negative for chills and fever.  Respiratory: Negative for shortness of breath.   Cardiovascular: Negative for chest pain and leg swelling.  Gastrointestinal: Negative for abdominal pain, nausea and vomiting.  Neurological: Negative for dizziness and headaches.    Physical Exam:  Vitals:   07/18/20 1317  BP: (!) 132/92  Pulse: 84  SpO2: 100%  Weight: 133 lb (60.3 kg)   Physical Exam Constitutional:      Appearance: Normal appearance.  HENT:     Head:   Cardiovascular:     Rate and Rhythm: Normal rate and regular rhythm.     Pulses: Normal pulses.     Heart sounds: Normal heart sounds.  Pulmonary:     Effort: Pulmonary effort is normal.     Breath sounds: Normal breath sounds.  Abdominal:     General: Abdomen is flat. Bowel sounds are normal.     Palpations: Abdomen is soft.  Skin:    General: Skin is warm and dry.     Comments: Small hyperpigmented papule, about 1-2 mm, located under left breast, no erythema, edema, or swelling noted   Neurological:     General: No focal deficit present.     Mental Status: She is alert and oriented to person, place, and time.  Psychiatric:        Mood and Affect: Mood normal.        Behavior: Behavior normal.          Assessment & Plan:   See Encounters Tab for problem based charting.  Patient discussed with Dr. Antony Contras

## 2020-07-18 NOTE — Assessment & Plan Note (Signed)
Patient reports that she is continuing to have depression, she is currently on Cymbalta 40 mg daily and states that she does not feel like this is helping at home.  She also states that the bottle stated that she was allergic to this medication, she does not recall being on this medication in the past however states that she has tried multiple medications.  She states that the Lexapro was the only medication that was helping and was wondering if she could get restarted on this.  Today her PHQ-9 is 14. On chart review it appears that while she was on Lexapro she had an increased craving for alcohol and had started drinking about 12 bottles of beer about 2-3 times per week.  Prior to this that she was drinking a few bottles a few times per week.   -Restart Lexapro 10 mg daily -Discontinue Cymbalta -Continue following with [facility] -RTC in 1 month

## 2020-07-18 NOTE — Assessment & Plan Note (Signed)
Patient reports that approximately 2 months ago she had a tick bite located under her left breast area, she was walking in the woods and noticed the tick about 2 days after it was attached.    She reports that the spot has not changed at all.  She denies any fevers, chills, weight loss, fatigue, or other symptoms. There is a small hyperpigmented papule with no surrounding erythema, warmth, or edema.  This looks like papular urticaria from a small insect bit. [place] is no endemic for lyme disease and patient was bitten 2 months ago, so no prophylaxis was given.   -Hydrocortisone cream applied to area x 2 weeks

## 2020-07-18 NOTE — Patient Instructions (Addendum)
Ms. Mary Meyer,  It was a pleasure to see you today. Thank you for coming in.   Today we discussed your depression. I am sorry that you are still not feeling well. I have restarted the Lexapro 10 mg daily, please follow up in 4 weeks to see how this is working. Please let us know if you are having more cravings for alcohol, we may need to switch you off of this is this occurs.  Continue following with Dr. Monna Fam. Continue working on your alcohol use, you are doing a good job on cutting down.   In regards to your scalp itchiness, this looks like it may be seborrheic dermatitis.  Please start using the ketoconazole cream shampoo, leave on for 3 to 5 minutes before rinsing off.  You can use this 2 times a week for the next 4 weeks.  In regards to the tick bite.  I have sent in a steroid cream that she can use on this area.  Please return to clinic in 1 month or sooner if needed.   Thank you again for coming in.   [name].D.

## 2020-07-18 NOTE — Assessment & Plan Note (Addendum)
Patient reports she has been having a rash a few months now, initially started at the base of her head and is very itchy, that area has cleared up and is now on the top of her head.  She denies any pain, excessive bleeding, or drainage.  She has not tried anything for the the itchiness.  She has been using coconut oil on her hair.  She has never had this before and no one in her family has this at this time.  Symptoms seem to be consistent with seborrheic dermatitis, will start patient on ketoconazole shampoo.  -Ketoconazole shampoo 2 times per week x1 month

## 2020-07-18 NOTE — Assessment & Plan Note (Addendum)
Patient is currently drinking about 6 bottles of beer 1-2 times per week, she has cut down about 12 bottles to 3 times per week a few weeks ago.  She reports she started drinking around age 30, will drink a few bottles about 1-2 times per week at that time.  She has a history of a DWI about 3 years ago, no issues since then.  She does occasionally feel guilty when she drinks too much, and any feelings guilt for the past year.  She does not need an eye opener in the morning.  She has been working with Dr. Monna Fam to try to cut down on her alcohol use.  She does not feel that this is interfering with her life, no one in her family has expressed concern about her alcohol use. Overall it does appear that patient did have a history of alcohol use disorder, she currently is able to cut down and has been working on decreasing the frequency.  She is able to maintain her current obligations at home, has not had any issues with withdrawal or needing to increase her usage.  Patient appears to be improving however is continuing to drink at this time. Given her significant depression and concerns difficulty cutting down she may need additional resources to help with cessation.  Did discuss naltrexone with patient, she will continue to work on cutting down without medications for now.  -Continue following Dr. Sallyanne Kuster -Can discuss naltrexone if alcohol use worsens

## 2020-07-19 ENCOUNTER — Ambulatory Visit: Payer: Medicaid Other | Admitting: Behavioral Health

## 2020-07-19 ENCOUNTER — Other Ambulatory Visit: Payer: Self-pay

## 2020-07-19 DIAGNOSIS — F331 Major depressive disorder, recurrent, moderate: Secondary | ICD-10-CM

## 2020-07-19 DIAGNOSIS — F419 Anxiety disorder, unspecified: Secondary | ICD-10-CM

## 2020-07-19 NOTE — BH Specialist Note (Signed)
Integrated Behavioral Health via Telemedicine Visit  07/19/2020 MALASHIA KAMAKA [id number]  Number of Integrated Behavioral Health visits: 2/6 Session Start time: 1:15pm  Session End time: 1:45pm Total time: 30  Referring Provider: Dr. Guinevere Scarlet, MD Patient/Family location: Pt in private @ home [facility] Provider location: [facility] Office All persons participating in visit: Pt & Clinician Types of Service: Individual psychotherapy  I connected with Deneen Harts Madero and/or Deneen Harts Treu's self via  Telephone or Video Enabled Telemedicine Application  (Video is Caregility application) and verified that I am speaking with the correct person using two identifiers. Discussed confidentiality: Yes   I discussed the limitations of telemedicine and the availability of in person appointments.  Discussed there is a possibility of technology failure and discussed alternative modes of communication if that failure occurs.  I discussed that engaging in this telemedicine visit, they consent to the provision of behavioral healthcare and the services will be billed under their insurance.  Patient and/or legal guardian expressed understanding and consented to Telemedicine visit: Yes   Presenting Concerns: Patient and/or family reports the following symptoms/concerns: elevated concerns for Son in [employer] Duration of problem: months; Severity of problem: mild  Patient and/or Family's Strengths/Protective Factors: Social and Emotional competence, Concrete supports in place (healthy food, safe environments, etc.), Sense of purpose and Physical Health (exercise, healthy diet, medication compliance, etc.)  Goals Addressed: Patient will: 1.  Reduce symptoms of: anxiety, depression and stress  2.  Increase knowledge and/or ability of: coping skills and stress reduction  3.  Demonstrate ability to: Increase healthy adjustment to current life circumstances  Progress  towards Goals: Ongoing  Interventions: Interventions utilized:  Solution-Focused Strategies and Supportive Counseling Standardized Assessments completed: use of screeners prn  Patient and/or Family Response: Pt receptive to call today & requests future check-in appt  Assessment: Patient currently experiencing elevated anxiety & concern for Son's end of the year exp w/Home School.   Patient may benefit from addt'l supportive Cslg services & TIC.  Plan: 1. Follow up with behavioral health clinician on : 2-3 wks on telehealth for 60 min 2. Behavioral recommendations: Journal as helpful btwn sessions 3. Referral(s): Integrated [employer] (In Clinic)  I discussed the assessment and treatment plan with the patient and/or parent/guardian. They were provided an opportunity to ask questions and all were answered. They agreed with the plan and demonstrated an understanding of the instructions.   They were advised to call back or seek an in-person evaluation if the symptoms worsen or if the condition fails to improve as anticipated.  Deneise Lever, LMFT

## 2020-07-20 NOTE — Addendum Note (Signed)
Addended by: [name] on: 07/20/2020 03:59 PM   Modules accepted: Orders

## 2020-07-20 NOTE — Progress Notes (Signed)
Internal Medicine Clinic Attending ° °Case discussed with Dr. Krienke  At the time of the visit.  We reviewed the resident’s history and exam and pertinent patient test results.  I agree with the assessment, diagnosis, and plan of care documented in the resident’s note.  °

## 2020-08-07 ENCOUNTER — Ambulatory Visit
Admission: EM | Admit: 2020-08-07 | Discharge: 2020-08-07 | Disposition: A | Discharge status: Home / Self Care | Payer: Medicaid Other | Attending: Emergency Medicine | Admitting: Emergency Medicine

## 2020-08-07 ENCOUNTER — Other Ambulatory Visit: Payer: Self-pay

## 2020-08-07 ENCOUNTER — Encounter: Payer: Self-pay | Admitting: Emergency Medicine

## 2020-08-07 DIAGNOSIS — L237 Allergic contact dermatitis due to plants, except food: Secondary | ICD-10-CM

## 2020-08-07 LAB — POCT URINE PREGNANCY: Preg Test, Ur: NEGATIVE

## 2020-08-07 MED ORDER — HYDROXYZINE HCL 25 MG PO TABS: 25.0000 mg | ORAL_TABLET | Freq: Four times a day (QID) | ORAL | 0 refills | Status: AC | PRN

## 2020-08-07 MED ORDER — ZANFEL EX MISC: CUTANEOUS | 0 refills | Status: DC

## 2020-08-07 MED ORDER — TRIAMCINOLONE ACETONIDE 0.025 % EX OINT: 1.0000 "application " | TOPICAL_OINTMENT | Freq: Two times a day (BID) | CUTANEOUS | 0 refills | Status: AC

## 2020-08-07 NOTE — ED Triage Notes (Signed)
Poison ivy on left arm since yesterday.

## 2020-08-07 NOTE — ED Provider Notes (Signed)
HPI  SUBJECTIVE:  Mary Meyer is a 30 y.o. female who presents with an intensely pruritic rash on her left arm.  She was working in the yard yesterday.  Thinks that she may have been exposed to poison ivy.  No weeping, crusting.  She tried Benadryl spray with improvement in her symptoms.  No ramping factors.  LMP: 2-1/2 months ago.  Not sure if she could be pregnant.  PMD: [facility].    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  Pt states she had a stent placed during the cath/ablation, but records don't reflect this.  Cards appt 04/26/14 @ [facility]: EKG & Holter monitor all returned normal per pt, no further f/u needed per cards    Urinary tract infection without hematuria 07/10/2019   Vaginal Pap smear, abnormal     Past Surgical History:  Procedure Laterality Date   CARDIAC CATHETERIZATION  2009   states due to tacycardia, had alblation   CARDIAC CATHETERIZATION  2009   CESAREAN SECTION N/A 10/03/2014   Procedure: CESAREAN SECTION;  Surgeon: Adam Phenix, MD;  Location: [facility];  Service: Obstetrics;  Laterality: N/A;    Family History  Problem Relation Age of Onset   Thyroid disease Maternal Aunt    Heart Problems Maternal Aunt    Cancer Maternal Grandmother    Other Maternal Grandmother        brain tumor   Cancer Maternal Grandfather     Social History   Tobacco Use   Smoking status: Every Day    Packs/day: 0.00    Years: 10.00    Pack years: 0.00    Types: Cigarettes   Smokeless tobacco: Never   Tobacco comments:    smokes 4-5  cig daily  Vaping Use   Vaping Use: Never used  Substance Use Topics   Alcohol use:  Yes    Alcohol/week: 0.0 standard drinks    Comment: occ    Drug use: No    No current facility-administered medications for this encounter.  Current Outpatient Medications:    hydrOXYzine (ATARAX/VISTARIL) 25 MG tablet, Take 1 tablet (25 mg total) by mouth every 6 (six) hours as needed for up to 10 days for itching., Disp: 30 tablet, Rfl: 0   Poison Ivy Treatments (ZANFEL) MISC, Apply per directions, Disp: 30 g, Rfl: 0   triamcinolone (KENALOG) 0.025 % ointment, Apply 1 application topically 2 (two) times daily for 14 days., Disp: 30 g, Rfl: 0   escitalopram (LEXAPRO) 10 MG tablet, Take 1 tablet (10 mg total) by mouth daily., Disp: 30 tablet, Rfl: 2   norethindrone-ethinyl estradiol (LOESTRIN FE) 1-20 MG-MCG tablet, Take 1 tablet by mouth daily., Disp: 28 tablet, Rfl: 11  No Known Allergies   ROS  As noted in HPI.   Physical Exam  BP (!) 147/84 (BP Location: Right Arm)   Pulse 83   Temp 98.3 F (36.8 C) (Temporal)   Resp 16   LMP  (LMP Unknown)   SpO2 97%   Constitutional: Well developed, well nourished, no acute distress Eyes:  EOMI, conjunctiva normal bilaterally HENT: Normocephalic, atraumatic,mucus membranes  moist Respiratory: Normal inspiratory effort Cardiovascular: Normal rate GI: nondistended skin: 3 areas of a nontender erythematous papular/linear rash on left forearm. No crusting.         Musculoskeletal: no deformities Neurologic: Alert & oriented x 3, no focal neuro deficits Psychiatric: Speech and behavior appropriate   ED Course   Medications - No data to display  Orders Placed This Encounter  Procedures   POCT urine pregnancy    Standing Status:   Standing    Number of Occurrences:   1    Results for orders placed or performed during the hospital encounter of 08/07/20 (from the past 24 hour(s))  POCT urine pregnancy     Status: None   Collection Time: 08/07/20  2:33 PM  Result Value Ref Range   Preg Test, Ur Negative Negative   No  results found.  ED Clinical Impression  1. Poison ivy dermatitis      ED Assessment/Plan  Patient with a poison ivy dermatitis.  Do Not feel that she needs systemic steroids at this point in time.  will try triamcinolone ointment, Claritin or Zyrtec, and if that does not work, then Atarax.  Follow-up with PMD as needed.  Discussed medical decision making, treatment plan with patient.  She agrees with plan.   Meds ordered this encounter  Medications   Poison Ivy Treatments (ZANFEL) MISC    Sig: Apply per directions    Dispense:  30 g    Refill:  0   hydrOXYzine (ATARAX/VISTARIL) 25 MG tablet    Sig: Take 1 tablet (25 mg total) by mouth every 6 (six) hours as needed for up to 10 days for itching.    Dispense:  30 tablet    Refill:  0   triamcinolone (KENALOG) 0.025 % ointment    Sig: Apply 1 application topically 2 (two) times daily for 14 days.    Dispense:  30 g    Refill:  0      *This clinic note was created using Scientist, clinical (histocompatibility and immunogenetics). Therefore, there may be occasional mistakes despite careful proofreading.  ?    Domenick Gong, MD 08/08/20 1135

## 2020-08-07 NOTE — Discharge Instructions (Addendum)
Urine pregnancy negative.  Use TecNu before going out in areas with known poison ivy/oak.  This will help prevent you from getting poison ivy/oak.  If you get a rash, you can use Zanfel or TecNu extreme to deactivate the oil, which will stop the rash from spreading and help with the itching.  Apply antibacterial ointment on scabbed areas to help prevent infection.  If you were given steroids, make sure you finish all of them.  You may take Claritin, or Zyrtec.  If that does not work, then try the Atarax.  Dissolve 1 packet (or tablet) of Domeboro (aluminum acetate) in 1 pint of luke-warm water. Soak the affected areas with luke-warm Domeboro solution for 5-10 minutes twice daily. You may use apply gauze soaked in the domeboro.  Gently pat dry, Then apply the steriod / antibiotic cream. You may also take oatmeal baths with Aveeno oatmeal (1 cup in half full bathtub) or cornstarch/baking soda (1 cup each in half full bathtub). To prevent the oatmeal from caking in pipes, place it in a tied sock before dropping it into the bathtub.  Go to www.goodrx.com to look up your medications. This will give you a list of where you can find your prescriptions at the most affordable prices. Or ask the pharmacist what the cash price is, or if they have any other discount programs available to help make your medication more affordable. This can be less expensive than what you would pay with insurance.

## 2020-08-18 ENCOUNTER — Other Ambulatory Visit: Payer: Self-pay

## 2020-08-18 ENCOUNTER — Encounter: Payer: Medicaid Other | Admitting: Internal Medicine

## 2020-08-18 ENCOUNTER — Ambulatory Visit (INDEPENDENT_AMBULATORY_CARE_PROVIDER_SITE_OTHER): Payer: Medicaid Other | Admitting: Internal Medicine

## 2020-08-18 DIAGNOSIS — G8929 Other chronic pain: Secondary | ICD-10-CM

## 2020-08-18 DIAGNOSIS — F329 Major depressive disorder, single episode, unspecified: Secondary | ICD-10-CM

## 2020-08-18 DIAGNOSIS — M545 Low back pain, unspecified: Secondary | ICD-10-CM

## 2020-08-18 MED ORDER — CYCLOBENZAPRINE HCL 5 MG PO TABS: 5.0000 mg | ORAL_TABLET | Freq: Every day | ORAL | 0 refills | Status: DC | PRN

## 2020-08-18 MED ORDER — ESCITALOPRAM OXALATE 20 MG PO TABS: 20.0000 mg | ORAL_TABLET | Freq: Every day | ORAL | 2 refills | Status: DC

## 2020-08-18 NOTE — Patient Instructions (Signed)
Ms . Tufte,   It has been a pleasure meeting today!  Today we talked about your Lexapro medication and your back pain.  For your Lexapro we will increase the dose to 20 mg daily and will reassess in approximately 6 weeks.  For your back pain, it appears to be a musculoskeletal nature.  I have included an exercise handout for you. I would also recommend looking at yoga exercises online.  I will also prescribe a short course of Flexeril which is a muscle relaxant that should not be a daily medication.  Patient will be used during acute flares of your back pain period.  We will reassess in 6 weeks as well. Have a good day, Dolan Amen, MD

## 2020-08-18 NOTE — Progress Notes (Signed)
   CC: Depression and back pain  HPI:  Mary Meyer is a 30 y.o. person, with a PMH noted below, who presents to the clinic for depression and back pain. To see the management of their acute and chronic conditions, please see the A&P note under the Encounters tab.   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  Pt states she had a stent placed during the cath/ablation, but records don't reflect this.  Cards appt 04/26/14 @ [facility]: EKG & Holter monitor all returned normal per pt, no further f/u needed per cards    Urinary tract infection without hematuria 07/10/2019   Vaginal Pap smear, abnormal    Review of Systems:   Review of Systems  Constitutional:  Positive for malaise/fatigue. Negative for chills, fever and weight loss.  Cardiovascular:  Negative for chest pain.  Gastrointestinal:  Negative for abdominal pain, diarrhea, nausea and vomiting.  Musculoskeletal:  Positive for back pain. Negative for falls, joint pain, myalgias and neck pain.  Neurological:  Negative for dizziness, tingling and headaches.    Physical Exam:  Vitals:   08/18/20 1552  BP: (!) 127/99  Pulse: 73  Temp: 98.1 F (36.7 C)  TempSrc: Oral  SpO2: 100%  Weight: 133 lb 4.8 oz (60.5 kg)  Height: 5\' 3"  (1.6 m)   Physical Exam Vitals reviewed.  Constitutional:      General: She is not in acute distress.    Appearance: Normal appearance. She is not ill-appearing or toxic-appearing.  Cardiovascular:     Rate and Rhythm: Normal rate and regular rhythm.     Pulses: Normal pulses.     Heart sounds: Normal heart sounds. No murmur heard.   No friction rub. No gallop.   Pulmonary:     Effort: Pulmonary effort is normal.     Breath sounds: Normal breath sounds. No wheezing, rhonchi or rales.  Abdominal:     General: Bowel sounds are normal.     Palpations: Abdomen is soft.     Tenderness: There is no abdominal tenderness. There is no guarding.  Musculoskeletal:        General: No swelling.     Right lower leg: No edema.     Left lower leg: No edema.     Comments: Tenderness to the paraspinal musculature of the Lumbar region.   Neurological:     General: No focal deficit present.     Mental Status: She is alert and oriented to person, place, and time.     Motor: No weakness.  Psychiatric:        Mood and Affect: Mood normal.        Behavior: Behavior normal.     Assessment & Plan:   See Encounters Tab for problem based charting.  Patient discussed with Dr. 

## 2020-08-19 DIAGNOSIS — M549 Dorsalgia, unspecified: Secondary | ICD-10-CM | POA: Insufficient documentation

## 2020-08-19 NOTE — Assessment & Plan Note (Signed)
Patient presenting to the [facility] for follow up on her depression. She states that she feels like her medication was working, but feels as if its effects are dwindling. She states that this appears to be the typical response to the multiple antidepressants that she has tried in the past. Of the medications she has tried, she states Lexapro works the best for her. She is agreeable to increasing her current dose.  - Increase Lexapro to 20 mg daily

## 2020-08-19 NOTE — Assessment & Plan Note (Signed)
Patient presents to the clinic for back pain, which has been going on for "years." She states she was evaluated several years ago and that her provider suggested exercise. Her exercise regimen has been working a great deal in her yard.   Her back pain is described as tight. It is worse when she wakes up in the morning, and it takes 10-15 minutes to get out of bed due to her pain. The pain is alleviated as the day goes on and she moves around the house. There is no radiation. She denies red flag symptoms for chord compression.   A/P:  Patient presents to the clinic for chronic back pain. On physical exam her symptoms align with [facility] pain. We discussed proper exercise regimens, which the patient will implement. Will order a limited supply of Daily PRN flexeril for exacerbations. Patient defers use of tylenol/nsaids because she has tried them in the past and they have provided limited benefit.  - back exercise packet provided - Flexeril 5 mg QD PRN for acute flairs.  - Evaluate in 6 weeks, if minimum improvement consider PT eval

## 2020-08-22 ENCOUNTER — Ambulatory Visit: Payer: Medicaid Other | Admitting: Behavioral Health

## 2020-08-22 DIAGNOSIS — F419 Anxiety disorder, unspecified: Secondary | ICD-10-CM

## 2020-08-22 DIAGNOSIS — F331 Major depressive disorder, recurrent, moderate: Secondary | ICD-10-CM

## 2020-08-22 DIAGNOSIS — F1010 Alcohol abuse, uncomplicated: Secondary | ICD-10-CM

## 2020-08-23 NOTE — BH Specialist Note (Signed)
Integrated Behavioral Health via Telemedicine Visit  08/23/2020 [name] [id number]  Number of Integrated Behavioral Health visits: 3/6  Session Start time: 1:00pm  Session End time: 1:45pm Total time: 45   Referring Provider: Dr. Guinevere Scarlet, MD Patient/Family location: Pt @ home in private [facility] Provider location: Working remotely in private All persons participating in visit: Pt & Clinician  Types of Service: Individual psychotherapy  I connected with Mary Meyer and/or Mary Meyer's  self  via  Telephone or Video Enabled Telemedicine Application  (Video is Caregility application) and verified that I am speaking with the correct person using two identifiers. Discussed confidentiality: Yes   I discussed the limitations of telemedicine and the availability of in person appointments.  Discussed there is a possibility of technology failure and discussed alternative modes of communication if that failure occurs.  I discussed that engaging in this telemedicine visit, they consent to the provision of behavioral healthcare and the services will be billed under their insurance.  Patient and/or legal guardian expressed understanding and consented to Telemedicine visit: Yes   Presenting Concerns: Patient and/or family reports the following symptoms/concerns: It is [name] & she is home w/the children all day. She is hosting her 2 Nieces for 2 wks & is trying to get her home ready for them. Pt expressed her medication (Lexapro 20mg ) is working & helps her to cope. Pt related she is taking medication as prescribed, & it could help more. Pt sees Dr. on [facility] & will discuss this w/him. Duration of problem: months; Severity of problem: moderate; Pt cannot "lay around". She has been tired & resting all day until the children have gotten out of school. They are having a good [name] thus far.  Pt & Husb share a 12 pk of beer on the wknd. She does not consume ETOH on  the week days.  Patient and/or Family's Strengths/Protective Factors: Concrete supports in place (healthy food, safe environments, etc.), Sense of purpose, and Physical Health (exercise, healthy diet, medication compliance, etc.)  Goals Addressed: Patient will:  Reduce symptoms of: anxiety, depression, and stress   Increase knowledge and/or ability of: coping skills, healthy habits, and stress reduction   Demonstrate ability to: Increase healthy adjustment to current life circumstances and Increase motivation to adhere to plan of care  Progress towards Goals: Ongoing  Interventions: Interventions utilized:  Solution-Focused Strategies and Supportive Counseling Standardized Assessments completed:  screeners prn  Patient and/or Family Response: Pt receptive to call today & requests future appts  Assessment: Patient currently experiencing improved use of ETOH & noticing inc'd progress w/her medications. Pt uses Flexiril @ night to assist w/lower back pain & sleep.  Patient may benefit from cont'd Recovery Support initiation & Relapse Prevention.  Plan: Follow up with behavioral health clinician on : 2-3 wks for 30 min on telehealth Behavioral recommendations: Cont to dec use of ETOH & compliance w/medications. Use of coping skills as identified.  Referral(s): Integrated [facility] (In Clinic)  I discussed the assessment and treatment plan with the patient and/or parent/guardian. They were provided an opportunity to ask questions and all were answered. They agreed with the plan and demonstrated an understanding of the instructions.   They were advised to call back or seek an in-person evaluation if the symptoms worsen or if the condition fails to improve as anticipated.  [employer], LMFT

## 2020-08-30 ENCOUNTER — Encounter: Payer: Self-pay | Admitting: *Deleted

## 2020-09-14 ENCOUNTER — Other Ambulatory Visit: Payer: Self-pay | Admitting: Adult Health

## 2020-09-15 ENCOUNTER — Ambulatory Visit: Payer: Medicaid Other | Admitting: Behavioral Health

## 2020-09-15 DIAGNOSIS — F419 Anxiety disorder, unspecified: Secondary | ICD-10-CM

## 2020-09-15 DIAGNOSIS — Z7141 Alcohol abuse counseling and surveillance of alcoholic: Secondary | ICD-10-CM

## 2020-09-15 DIAGNOSIS — F331 Major depressive disorder, recurrent, moderate: Secondary | ICD-10-CM

## 2020-09-15 NOTE — BH Specialist Note (Signed)
Integrated Behavioral Health via Telemedicine Visit  09/15/2020 Mary Meyer [id number]  Number of Integrated Behavioral Health visits: 4/6 Session Start time: 10:00am  Session End time: 10:50am Total time: 50   Referring Provider: Dr. Guinevere Scarlet, MD Patient/Family location: Pt is home in private [facility] Provider location: [facility] All persons participating in visit: Pt & Clinician Types of Service: Individual psychotherapy and Health Promotion  I connected with Deneen Harts Cudney and/or Deneen Harts Veltre's  self  via  Telephone or Video Enabled Telemedicine Application  (Video is Caregility application) and verified that I am speaking with the correct person using two identifiers. Discussed confidentiality:  4th visit  I discussed the limitations of telemedicine and the availability of in person appointments.  Discussed there is a possibility of technology failure and discussed alternative modes of communication if that failure occurs.  I discussed that engaging in this telemedicine visit, they consent to the provision of behavioral healthcare and the services will be billed under their insurance.  Patient and/or legal guardian expressed understanding and consented to Telemedicine visit:  4th visit  Presenting Concerns: Patient and/or family reports the following symptoms/concerns: Pt is unsure Lexapro 20mg  is still helping optimally. She c/o losing her patience again, "staying ill", being, "snappy for no reason", & projecting a bad attitude to family. She can tell the Lexapro worked well right away, now she is thinking it may need a boost. Pt is not taking the Flexiril-it does not help her back pain. Let Pt know I would relate info to Dr. & her Team.   Pt exp'g sleep hygiene issues. Discussed Sleep Hygiene tips she can use immed'ly to assist in her sleep.  Pt reluctant to speak about sexual intimacy issues, but Clinician put her @ ease by normalizing this  concern. Offered psychoedu re: psychopharmacologicals & how they can interfere w/women's orgasmic ability. Pt has been dealing w/the lack of sensation & desire for 2 mos. Her & Fiance have previously dealt w/this same issue on his part & he is empathetic towards her. Provided Pt w/info & suggestions to have sex in the morning as this is when Lexapro dose will be the lowest. Instructed Pt to discuss this plan w/Fiance & try it 2-3 times/week, as this is the optimal amt of sexual activity that keeps Cpls connected emotionally. Pt acknowledged, agreed to try until her next appt & then discuss w/Physician to make the best dec about any titration up on the Lexapro.   Duration of problem: 2 mos; Severity of problem: moderate trending severe; Pt almost tearful when discussing.  Patient and/or Family's Strengths/Protective Factors: Social connections, Social and Cleaster Corin, Concrete supports in place (healthy food, safe environments, etc.), Sense of purpose, and Physical Health (exercise, healthy diet, medication compliance, etc.)  Goals Addressed: Patient will:  Reduce symptoms of: anxiety, depression, stress, and distress due to lack of satisfying completion of intimacy w/Fiance.    Increase knowledge and/or ability of: coping skills, healthy habits, stress reduction, and psychoedu re: sexual intimacy needs/performance/completion.    Demonstrate ability to: Increase healthy adjustment to current life circumstances and try the plan discussed for satisfying intimacy  see what this effort reveals prior to next visit w/Physician.  Progress towards Goals: Revised; Pt will address intimacy issues in new way to determine helpfulness vs need for med titration vs change of medication.  Interventions: Interventions utilized:  Sleep Hygiene and sexual intimacy psychoedu Standardized Assessments completed:  screeners prn  Patient and/or Family Response: Pt responsive to  call. Pt reluctant to disclose  about sexual intimacy issues due to likely s/e of Lexapro. Pt became inc'ly able to speak about "embarrassing issue" as visit progressed. Thanked Pt for trusting Clinician w/such delicate issue.  Assessment: Patient currently experiencing reduced benefit from medication & probable inc'd s/e impact. Pt will do as instructed to see if morning sex helps & report back next visit. Pt will call prn.  Patient may benefit from cont'd support & encouragement for the aforementioned issues & cont's Recover Support/Relapse Prevention.  Plan: Follow up with behavioral health clinician on : 2-3 wks on telehealth for 60 min. Behavioral recommendations: As directed in Presenting Concerns. Referral(s): Integrated [employer] (In Clinic)  I discussed the assessment and treatment plan with the patient and/or parent/guardian. They were provided an opportunity to ask questions and all were answered. They agreed with the plan and demonstrated an understanding of the instructions.   They were advised to call back or seek an in-person evaluation if the symptoms worsen or if the condition fails to improve as anticipated.  Deneise Lever, LMFT

## 2020-09-23 ENCOUNTER — Encounter: Payer: Medicaid Other | Admitting: Student

## 2020-10-06 ENCOUNTER — Ambulatory Visit: Payer: Medicaid Other | Admitting: Behavioral Health

## 2020-10-13 ENCOUNTER — Telehealth: Payer: Self-pay | Admitting: Behavioral Health

## 2020-10-13 ENCOUNTER — Ambulatory Visit: Payer: Medicaid Other | Admitting: Behavioral Health

## 2020-10-13 NOTE — Telephone Encounter (Signed)
Contacted Pt for 3:00pm telehealth session. Attempted 3 times-lft msg for Pt to r/s @ her convenience.  Dr. Monna Fam

## 2020-10-14 ENCOUNTER — Ambulatory Visit: Payer: Medicaid Other | Admitting: Internal Medicine

## 2020-10-14 VITALS — BP 129/93 | HR 64 | Temp 98.2°F | Wt 137.3 lb

## 2020-10-14 DIAGNOSIS — G8929 Other chronic pain: Secondary | ICD-10-CM | POA: Diagnosis not present

## 2020-10-14 DIAGNOSIS — F331 Major depressive disorder, recurrent, moderate: Secondary | ICD-10-CM | POA: Diagnosis not present

## 2020-10-14 DIAGNOSIS — F329 Major depressive disorder, single episode, unspecified: Secondary | ICD-10-CM

## 2020-10-14 DIAGNOSIS — M545 Low back pain, unspecified: Secondary | ICD-10-CM | POA: Diagnosis not present

## 2020-10-14 MED ORDER — NAPROXEN 500 MG PO TABS
500.0000 mg | ORAL_TABLET | Freq: Two times a day (BID) | ORAL | 0 refills | Status: AC
Start: 2020-10-14 — End: 2020-10-24

## 2020-10-14 MED ORDER — ESCITALOPRAM OXALATE 10 MG PO TABS
10.0000 mg | ORAL_TABLET | Freq: Every day | ORAL | 0 refills | Status: DC
Start: 2020-10-14 — End: 2020-11-25

## 2020-10-14 NOTE — Patient Instructions (Signed)
Thank you, Ms.Kymberly M Linderman for allowing [place] to provide your care today. Today we discussed depression, back pain.    Labs Ordered: Lab Orders  No laboratory test(s) ordered today     Tests Ordered: Orders Placed This Encounter  Procedures   Ambulatory referral to Physical Therapy      Referrals Ordered:   Referral Orders         Ambulatory referral to Physical Therapy      Medication Changes:  There are no discontinued medications.   Meds ordered this encounter  Medications   escitalopram (LEXAPRO) 10 MG tablet    Sig: Take 1 tablet (10 mg total) by mouth daily.    Dispense:  30 tablet    Refill:  0   naproxen (NAPROSYN) 500 MG tablet    Sig: Take 1 tablet (500 mg total) by mouth 2 (two) times daily with a meal for 10 days.    Dispense:  20 tablet    Refill:  0     Health Maintenance Screening: There are no preventive care reminders to display for this patient.   Instructions:  - please take tylenol 1000 mg in the morning, at lunch and at night as needed   Follow up:  1 month    Remember: If you have any questions or concerns, call our clinic at [phone] or after hours call [phone] and ask for the internal medicine resident on call.  Dellia Cloud, D.O. [facility]

## 2020-10-14 NOTE — Progress Notes (Signed)
CC: Depression  HPI:  [name] is a 30 y.o. female with a past medical history stated below and presents today for depression. Please see problem based assessment and plan for additional details.  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  Pt states she had a stent placed during the cath/ablation, but records don't reflect this.  Cards appt 04/26/14 @ [facility]: EKG & Holter monitor all returned normal per pt, no further f/u needed per cards    Urinary tract infection without hematuria 07/10/2019   Vaginal Pap smear, abnormal     Current Outpatient Medications on File Prior to Visit  Medication Sig Dispense Refill   escitalopram (LEXAPRO) 20 MG tablet Take 1 tablet (20 mg total) by mouth daily. 30 tablet 2   LARIN FE 1/20 1-20 MG-MCG tablet Take 1 tablet by mouth once daily 84 tablet 0   Poison Ivy Treatments (ZANFEL) MISC Apply per directions 30 g 0   No current facility-administered medications on file prior to visit.    Family History  Problem Relation Age of Onset   Thyroid disease Maternal Aunt    Heart Problems Maternal Aunt    Cancer Maternal Grandmother    Other Maternal Grandmother        brain tumor   Cancer Maternal Grandfather     Social History   Socioeconomic History   Marital status: Single    Spouse name: Not on file   Number of children: 1   Years of education: Not on file   Highest education level: Not on file  Occupational History   Not on file  Tobacco Use   Smoking status: Every Day    Packs/day: 0.00    Years: 10.00    Pack years: 0.00    Types: Cigarettes   Smokeless tobacco: Never   Tobacco  comments:    smokes 4-5  cig daily  Vaping Use   Vaping Use: Never used  Substance and Sexual Activity   Alcohol use: Yes    Alcohol/week: 0.0 standard drinks    Comment: occ    Drug use: No   Sexual activity: Yes    Birth control/protection: Patch  Other Topics Concern   Not on file  Social History Narrative   Not on file   Social Determinants of Health   Financial Resource Strain: Not on file  Food Insecurity: Not on file  Transportation Needs: Not on file  Physical Activity: Not on file  Stress: Not on file  Social Connections: Not on file  Intimate Partner Violence: Not on file    Review of Systems: ROS negative except for what is noted on the assessment and plan.  Vitals:   10/14/20 1056  BP: (!) 129/93  Pulse: 64  Temp: 98.2 F (36.8 C)  TempSrc: Oral  SpO2: 100%  Weight: 137 lb 4.8 oz (62.3 kg)    Physical Exam: Gen: A&O x3 and in no apparent distress, well appearing and nourished. MSK: Grossly normal AROM and strength x4 extremities.TTP of lower back/paraspinal musculature.   Skin: good skin turgor, no rashes, unusual bruising, or prominent lesions.  Neuro:  No focal deficits, grossly normal sensation and coordination.  Psych: Oriented x3 and responding appropriately. Intact recent and remote memory, depressed mood, normal judgement, affect , and insight.    Assessment & Plan:   See Encounters Tab for problem based charting.  Patient discussed with Dr. Janeece Agee, D.O. [facility] Internal Medicine, PGY-3 Pager: [phone], Phone: [phone] Date 10/16/2020 Time 7:38 AM

## 2020-10-16 ENCOUNTER — Encounter: Payer: Self-pay | Admitting: Internal Medicine

## 2020-10-16 NOTE — Assessment & Plan Note (Addendum)
Patient presents for reevaluation and management of her depression. She states that her symptoms initially improved with laxapro 20 mg daily but has notices that her symptoms are not completely in remission. She continues to have difficulties with fatigued throughout the day and lack of sleep at night and her PHQ-9 remains elevated. She denies SI/HI or psychotic features. She is currently working with Dr. Monna Fam for counseling.   Plan: - Will titrate up her lexapro to 30 mg - If symptoms are still uncontrolled will consider switching versus add on therapy.  - Recommended making another appt with Dr. Monna Fam   - Follow up in one month.

## 2020-10-16 NOTE — Assessment & Plan Note (Signed)
Patient presents for follow up for low back pain. She states that her back pain is located in her lower lumbar back in the paraspinal musculature. She states that the pain is most severe first thing in the morning when she wakes up. She denies any recent trauma or overuse injury. She does admit to recently switching to a foam matters and does not believe this is contributing to her back pain. She was recently prescribed a muscle relaxer, but discontinued this stating that it was not effective. She has not had any lumbar imaging. Her exam is reassuring without high risk features and indicated muscle strain.   Plan: - Counseled her regarding activity modification - Prescribed short course of NSAIDs and tylenol - Referred to PT

## 2020-10-28 ENCOUNTER — Other Ambulatory Visit: Payer: Self-pay

## 2020-10-28 ENCOUNTER — Telehealth: Payer: Self-pay | Admitting: Adult Health

## 2020-10-28 MED ORDER — NORETHIN ACE-ETH ESTRAD-FE 1-20 MG-MCG PO TABS: 1.0000 | ORAL_TABLET | Freq: Every day | ORAL | 0 refills | Status: DC

## 2020-10-28 NOTE — Telephone Encounter (Signed)
Patient is calling stating that she will be out of her bc next week her pap is scheduled for 11/29/20 asking if can get a refill to last until then to be sent to pharmacy [facility] [place] please

## 2020-11-11 ENCOUNTER — Encounter: Payer: Medicaid Other | Admitting: Internal Medicine

## 2020-11-16 ENCOUNTER — Encounter (HOSPITAL_COMMUNITY): Payer: Self-pay | Admitting: Physical Therapy

## 2020-11-16 ENCOUNTER — Ambulatory Visit (HOSPITAL_COMMUNITY): Payer: Medicaid Other | Attending: Internal Medicine | Admitting: Physical Therapy

## 2020-11-16 ENCOUNTER — Other Ambulatory Visit: Payer: Self-pay

## 2020-11-16 DIAGNOSIS — R29898 Other symptoms and signs involving the musculoskeletal system: Secondary | ICD-10-CM | POA: Insufficient documentation

## 2020-11-16 DIAGNOSIS — M6281 Muscle weakness (generalized): Secondary | ICD-10-CM | POA: Diagnosis not present

## 2020-11-16 DIAGNOSIS — M545 Low back pain, unspecified: Secondary | ICD-10-CM | POA: Diagnosis not present

## 2020-11-16 DIAGNOSIS — R2689 Other abnormalities of gait and mobility: Secondary | ICD-10-CM | POA: Insufficient documentation

## 2020-11-16 NOTE — Patient Instructions (Signed)
Access Code: [email or web address] URL: [email or web address] Date: 11/16/2020 Prepared by: Greig Castilla Garvey Westcott  Exercises Abdominal Bracing - 3 x daily - 7 x weekly - 10 reps - 5 second hold

## 2020-11-16 NOTE — Therapy (Signed)
[facility] [address] [place], [place], [address] Phone: [phone]   Fax:  [phone]  Physical Therapy Evaluation  Patient Details  Name: Mary Meyer MRN: [id number] Date of Birth: 1990-10-17 Referring Provider (PT): Earl Lagos, MD   Encounter Date: 11/16/2020   PT End of Session - 11/16/20 1120     Visit Number 1    Number of Visits 12    Date for PT Re-Evaluation 12/28/20    Authorization Type [employer] med[employer]    Authorization Time Period 12 visits requested check auth    Authorization - Visit Number 1    Authorization - Number of Visits 1    PT Start Time 1044    PT Stop Time 1118    PT Time Calculation (min) 34 min    Activity Tolerance Patient tolerated treatment well    Behavior During Therapy [facility] for tasks assessed/performed             Past Medical History:  Diagnosis Date   Anxiety    Asthma    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  Pt states she had a stent placed during the cath/ablation, but records don't reflect this.  Cards appt 04/26/14 @ [facility]: EKG & Holter monitor all returned normal per pt, no further f/u needed per cards    Urinary tract infection without hematuria 07/10/2019   Vaginal Pap smear, abnormal     Past Surgical History:  Procedure Laterality Date   CARDIAC CATHETERIZATION  2009   states due to tacycardia, had alblation   CARDIAC CATHETERIZATION  2009   CESAREAN SECTION N/A 10/03/2014   Procedure: CESAREAN SECTION;  Surgeon: Adam Phenix, MD;  Location: [facility];  Service: Obstetrics;  Laterality: N/A;    There were no vitals filed for this visit.    Subjective Assessment - 11/16/20  1043     Subjective Patient is a 30 y.o. female who presents to physical therapy with c/o chronic LBP. They never did any scans of her back. She has had pain for 5 years and it is now working up her back. Meds are not helping. Symptoms began with insidious onset and just continued. Symptoms aggravated upon waking but ease some during the day. Movement tends to be helpful. Symptoms have been working to middle of her back. Her main goal is to have her back feel better.    Limitations Lifting;Standing;House hold activities    Patient Stated Goals back to feel better    Currently in Pain? 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  No    Is the patient reluctant to leave their home because of a fear of falling?  No      Prior Function   Level of Independence Independent    Vocation Part time employment    Vocation Requirements [employer]      Cognition   Overall Cognitive Status Within Functional Limits for tasks assessed      Observation/Other Assessments   Observations Ambulates without AD    Focus on Therapeutic Outcomes (FOTO)  n/a      Sensation   Light Touch Appears Intact      ROM / Strength   AROM / PROM / Strength AROM;Strength      AROM    AROM Assessment Site Lumbar    Lumbar Flexion 0% limited, pain in low back    Lumbar Extension 25% limited,pain in low back    Lumbar - Right Side Bend 0% limited,pain in low back    Lumbar - Left Side Bend 0% limited, pain in low back    Lumbar - Right Rotation 0% limited    Lumbar - Left Rotation 0% limited      Strength   Strength Assessment Site Hip;Knee;Ankle    Right/Left hand Right;Left    Right/Left Hip Right;Left    Right Hip Flexion 4+/5    Right Hip Extension 4+/5    Right Hip ABduction 5/5    Left Hip Flexion 4+/5    Left Hip Extension 4+/5    Left Hip ABduction 5/5    Right/Left Knee Right;Left    Right Knee Flexion 5/5    Right Knee Extension 5/5    Left Knee Flexion 5/5    Left Knee Extension 5/5    Right/Left Ankle Right;Left    Right Ankle Dorsiflexion 5/5    Left Ankle Dorsiflexion 5/5      Palpation   Patella mobility TTP thoracic and lumbar paraspinals bilateral, bilateral QL, bilateral glutes    Spinal mobility tender lower lumbar    SI assessment  "felt good"                        Objective measurements completed on examination: See above findings.       [facility] Adult PT Treatment/Exercise - 11/16/20 0001       Exercises   Exercises Lumbar      Lumbar Exercises: Supine   Ab Set 10 reps;5 seconds                     PT Education - 11/16/20 1043     Education Details Patient educated on exam findings, POC, scope of PT, HEP    Person(s) Educated Patient    Methods Explanation;Handout;Demonstration;Tactile cues;Verbal cues    Comprehension Verbalized understanding              PT Short Term Goals - 11/16/20 1123       PT SHORT TERM GOAL #1   Title Patient will be independent with HEP in order to improve  functional outcomes.    Time 3    Period Weeks    Status New    Target Date 12/07/20      PT SHORT TERM GOAL #2   Title Patient will report at least 25% improvement in symptoms for improved quality  of life.    Time 3    Period Weeks    Status New    Target Date 12/07/20               PT Long Term Goals - 11/16/20 1124       PT LONG TERM GOAL #1   Title Patient will report at least 75% improvement in symptoms for improved quality of life.    Time 6    Period Weeks    Status New    Target Date 12/28/20      PT LONG TERM GOAL #2   Title Patient will demonstrate grade of 5/5 MMT grade in all tested musculature as evidence of improved strength to assist with stair ambulation and gait.    Time 6    Period Weeks    Status New    Target Date 12/28/20      PT LONG TERM GOAL #3   Title Patient will be able to perform all ADL and wake from sleeping with pain no greater than 2/10 for improved tolerance to activity.    Time 6    Period Weeks    Status New    Target Date 12/28/20                    Plan - 11/16/20 1121     Clinical Impression Statement Patient is a 30 y.o. female who presents to physical therapy with c/o chronic LBP. She presents with pain limited deficits in lumbar spine strength, ROM, endurance, postural impairments, spinal mobility and functional mobility with ADL. Symptoms likely related to core weakness. She is having to modify and restrict ADL as indicated by subjective information and objective measures which is affecting overall participation. Patient will benefit from skilled physical therapy in order to improve function and reduce impairment.    Personal Factors and Comorbidities Time since onset of injury/illness/exacerbation;Past/Current Experience    Examination-Activity Limitations Locomotion Level;Bed Mobility;Sleep;Squat;Stairs;Stand;Lift    Examination-Participation Restrictions Occupation;Cleaning;Shop;Volunteer;Yard Work    Stability/Clinical Decision Making Stable/Uncomplicated    Optometrist Low    Rehab Potential Good    PT Frequency 2x / week    PT Duration 6 weeks    PT Treatment/Interventions ADLs/Self Care  Home Management;Aquatic Therapy;Cryotherapy;Electrical Stimulation;Iontophoresis 4mg /ml Dexamethasone;Moist Heat;Traction;Ultrasound;DME Instruction;Gait training;Functional mobility training;Stair training;Therapeutic activities;Therapeutic exercise;Balance training;Neuromuscular re-education;Patient/family education;Orthotic Fit/Training;Manual techniques;Dry needling;Energy conservation;Splinting;Taping;Vasopneumatic Device;Spinal Manipulations;Joint Manipulations    PT Next Visit Plan core strength    PT Home Exercise Plan ab set    Consulted and Agree with Plan of Care Patient             Patient will benefit from skilled therapeutic intervention in order to improve the following deficits and impairments:  Decreased range of motion, Decreased endurance, Decreased activity tolerance, Pain, Decreased mobility, Decreased strength, Postural dysfunction, Improper body mechanics  Visit Diagnosis: Low back pain, unspecified back pain laterality, unspecified chronicity, unspecified whether sciatica present  Muscle weakness (generalized)  Other abnormalities of gait and mobility  Other symptoms and signs involving the musculoskeletal system     Problem List Patient Active Problem List   Diagnosis Date Noted   Back pain 08/19/2020   Papular urticaria 07/18/2020  Seborrheic dermatitis 07/18/2020   Alcohol use disorder, mild, abuse 07/18/2020   Tobacco use 05/02/2020   Healthcare maintenance 04/24/2019   Depression 04/24/2019   Anxiety 12/15/2015   Abnormal Pap smear of cervix 05/07/2014   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  [place] [facility] [address] [place], [place], [address] Phone: [phone]   Fax:  [phone]  Name: Mary Meyer MRN: [id number] Date of Birth: 08/25/1990

## 2020-11-25 ENCOUNTER — Other Ambulatory Visit: Payer: Self-pay | Admitting: *Deleted

## 2020-11-25 ENCOUNTER — Encounter: Payer: Medicaid Other | Admitting: Internal Medicine

## 2020-11-25 DIAGNOSIS — F329 Major depressive disorder, single episode, unspecified: Secondary | ICD-10-CM

## 2020-11-25 DIAGNOSIS — F331 Major depressive disorder, recurrent, moderate: Secondary | ICD-10-CM

## 2020-11-25 NOTE — Telephone Encounter (Signed)
Per PCP's OV note on 8/19, patient was to titrate to 30 mg. Please send refill

## 2020-11-25 NOTE — Telephone Encounter (Signed)
Patient cancelled her appointment today due to the weather. She need a refill on her Lexapro 20 mg.

## 2020-11-27 MED ORDER — ESCITALOPRAM OXALATE 10 MG PO TABS: 10.0000 mg | ORAL_TABLET | Freq: Every day | ORAL | 0 refills | Status: DC

## 2020-11-27 MED ORDER — ESCITALOPRAM OXALATE 20 MG PO TABS: 20.0000 mg | ORAL_TABLET | Freq: Every day | ORAL | 2 refills | Status: DC

## 2020-11-29 ENCOUNTER — Other Ambulatory Visit: Payer: Self-pay

## 2020-11-29 ENCOUNTER — Ambulatory Visit (INDEPENDENT_AMBULATORY_CARE_PROVIDER_SITE_OTHER): Payer: Medicaid Other | Admitting: Women's Health

## 2020-11-29 ENCOUNTER — Encounter: Payer: Self-pay | Admitting: Women's Health

## 2020-11-29 VITALS — BP 130/82 | HR 81 | Ht 63.0 in | Wt 138.0 lb

## 2020-11-29 DIAGNOSIS — R32 Unspecified urinary incontinence: Secondary | ICD-10-CM | POA: Diagnosis not present

## 2020-11-29 DIAGNOSIS — Z01419 Encounter for gynecological examination (general) (routine) without abnormal findings: Secondary | ICD-10-CM | POA: Diagnosis not present

## 2020-11-29 DIAGNOSIS — Z3041 Encounter for surveillance of contraceptive pills: Secondary | ICD-10-CM | POA: Diagnosis not present

## 2020-11-29 MED ORDER — NORETHIN ACE-ETH ESTRAD-FE 1-20 MG-MCG PO TABS: 1.0000 | ORAL_TABLET | Freq: Every day | ORAL | 3 refills | Status: DC

## 2020-11-29 MED ORDER — MIRABEGRON ER 25 MG PO TB24: 25.0000 mg | ORAL_TABLET | Freq: Every day | ORAL | 11 refills | Status: DC

## 2020-11-29 NOTE — Progress Notes (Signed)
WELL-WOMAN EXAMINATION Patient name: Mary Meyer MRN [id number]  Date of birth: 03-26-1990 Chief Complaint:   Annual Exam  History of Present Illness:   Mary Meyer is a 30 y.o. G37P1011 Caucasian female being seen today for a routine well-woman exam.  Current complaints: urinary incontinence (not necessarily stress/urge, just happens randomly), did well on mybretriq in past (was using samples, rx wouldn't go through), then decided to try for pregnancy, so stopped. No longer trying for pregnancy right now, so would like to restart myrbetriq.  PCP: Marchia Bond      does not desire labs Patient's last menstrual period was 11/01/2020 (approximate). The current method of family planning is OCP (estrogen/progesterone).  Last pap 03/10/19. Results were: NILM w/ HRHPV negative. H/O abnormal pap: yes 2017 ASCUS w/ +HRHPV Last mammogram: never. Results were: N/A. Family h/o breast cancer: no Last colonoscopy: never. Results were: N/A. Family h/o colorectal cancer: no  Depression screen [facility] 2/9 11/29/2020 10/14/2020 07/18/2020 06/09/2020 05/02/2020  Decreased Interest 0 3 3 2 1   Down, Depressed, Hopeless 0 2 3 2 2   PHQ - 2 Score 0 5 6 4 3   Altered sleeping 2 3 3 1 3   Tired, decreased energy 1 3 2 3 2   Change in appetite 1 1 0 0 0  Feeling bad or failure about yourself  1 2 1 1 3   Trouble concentrating 0 2 2 0 0  Moving slowly or fidgety/restless 0 2 0 1 0  Suicidal thoughts 0 0 0 0 0  PHQ-9 Score 5 18 14 10 11   Difficult doing work/chores - Somewhat difficult Somewhat difficult Somewhat difficult Somewhat difficult     GAD 7 : Generalized Anxiety Score 11/29/2020 05/02/2020  Nervous, Anxious, on Edge 0 3  Control/stop worrying 0 3  Worry too much - different things 0 3  Trouble relaxing 1 1  Restless 1 0  Easily annoyed or irritable 0 3  Afraid - awful might happen 0 3  Total GAD 7 Score 2 16     Review of Systems:   Pertinent items are noted in HPI Denies any headaches, blurred  vision, fatigue, shortness of breath, chest pain, abdominal pain, abnormal vaginal discharge/itching/odor/irritation, problems with periods, bowel movements, urination, or intercourse unless otherwise stated above. Pertinent History Reviewed:  Reviewed past medical,surgical, social and family history.  Reviewed problem list, medications and allergies. Physical Assessment:   Vitals:   11/29/20 1057  BP: 130/82  Pulse: 81  Weight: 138 lb (62.6 kg)  Height: 5\' 3"  (1.6 m)  Body mass index is 24.45 kg/m.        Physical Examination:   General appearance - well appearing, and in no distress  Mental status - alert, oriented to person, place, and time  Psych:  She has a normal mood and affect  Skin - warm and dry, normal color, no suspicious lesions noted  Chest - effort normal, all lung fields clear to auscultation bilaterally  Heart - normal rate and regular rhythm  Neck:  midline trachea, no thyromegaly or nodules  Breasts - breasts appear normal, no suspicious masses, no skin or nipple changes or  axillary nodes  Abdomen - soft, nontender, nondistended, no masses or organomegaly  Pelvic - VULVA: normal appearing vulva with no masses, tenderness or lesions  VAGINA: normal appearing vagina with normal color and discharge, no lesions  CERVIX: normal appearing cervix without discharge or lesions, no CMT  Thin prep pap is not done   UTERUS: uterus is  felt to be normal size, shape, consistency and nontender   ADNEXA: No adnexal masses or tenderness noted.  Extremities:  No swelling or varicosities noted  Chaperone: Angel Neas    No results found for this or any previous visit (from the past 24 hour(s)).  Assessment & Plan:  1) Well-Woman Exam  2) Urinary incontinence> did well on mybretriq in past, wants to restart. 4 samples given, rx sent to [facility] pharmacy  3) Contraception management> refilled COCs x 32yr  Labs/procedures today: exam  Mammogram: @ 31yo, or sooner if  problems Colonoscopy: @ 31yo, or sooner if problems  No orders of the defined types were placed in this encounter.   Meds:  Meds ordered this encounter  Medications   mirabegron ER (MYRBETRIQ) 25 MG TB24 tablet    Sig: Take 1 tablet (25 mg total) by mouth daily.    Dispense:  30 tablet    Refill:  11    Order Specific Question:   Supervising Provider    Answer:   Duane Lope H [[id number]]   norethindrone-ethinyl estradiol-FE (LARIN FE 1/20) 1-20 MG-MCG tablet    Sig: Take 1 tablet by mouth daily.    Dispense:  84 tablet    Refill:  3    Order Specific Question:   Supervising Provider    Answer:   Lazaro Arms [[id number]]    Follow-up: Return in about 1 year (around 11/29/2021) for Physical.  Cheral Marker CNM, WHNP-BC 11/29/2020 11:23 AM

## 2021-01-04 ENCOUNTER — Ambulatory Visit
Admission: EM | Admit: 2021-01-04 | Discharge: 2021-01-04 | Disposition: A | Discharge status: Home / Self Care | Payer: Medicaid Other | Attending: Family Medicine | Admitting: Family Medicine

## 2021-01-04 ENCOUNTER — Other Ambulatory Visit: Payer: Self-pay

## 2021-01-04 DIAGNOSIS — R509 Fever, unspecified: Secondary | ICD-10-CM

## 2021-01-04 DIAGNOSIS — Z20822 Contact with and (suspected) exposure to covid-19: Secondary | ICD-10-CM | POA: Diagnosis not present

## 2021-01-04 DIAGNOSIS — J069 Acute upper respiratory infection, unspecified: Secondary | ICD-10-CM

## 2021-01-04 DIAGNOSIS — R000 Tachycardia, unspecified: Secondary | ICD-10-CM | POA: Diagnosis not present

## 2021-01-04 MED ORDER — PROMETHAZINE-DM 6.25-15 MG/5ML PO SYRP: 5.0000 mL | ORAL_SOLUTION | Freq: Four times a day (QID) | ORAL | 0 refills | Status: DC | PRN

## 2021-01-04 MED ORDER — OSELTAMIVIR PHOSPHATE 75 MG PO CAPS: 75.0000 mg | ORAL_CAPSULE | Freq: Two times a day (BID) | ORAL | 0 refills | Status: DC

## 2021-01-04 NOTE — ED Provider Notes (Signed)
[facility]    CSN: [id number] Arrival date & time: 01/04/21  1240      History   Chief Complaint No chief complaint on file.   HPI Mary Meyer is a 30 y.o. female.   Patient presenting today with 1 day history of fever, chills, body aches, fatigue, cough, congestion, headache.  Denies chest pain, shortness of breath, abdominal pain, nausea vomiting or diarrhea.  So far has not taken anything over-the-counter for symptoms.  Multiple sick contacts recently but unsure with what.  History of asthma as a child but has not needed an inhaler in quite some time.   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  Pt states she had a stent placed during the cath/ablation, but records don't reflect this.  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 Authorizing Provider  oseltamivir (TAMIFLU) 75 MG capsule Take 1 capsule (75 mg total) by mouth every 12 (twelve) hours. 01/04/21  Yes Volney American, PA-C  promethazine-dextromethorphan (PROMETHAZINE-DM) 6.25-15 MG/5ML syrup Take 5 mLs by mouth 4 (four) times daily as needed for cough. 01/04/21  Yes Volney American, PA-C  escitalopram (LEXAPRO) 10 MG tablet Take 1 tablet (10 mg total) by mouth daily. 11/27/20 12/27/20  Marianna Payment, MD  escitalopram (LEXAPRO) 20 MG tablet Take 1 tablet (20 mg total) by mouth daily. 11/27/20 11/27/21  Marianna Payment, MD  mirabegron ER (MYRBETRIQ) 25 MG TB24 tablet Take 1 tablet (25 mg total) by mouth daily. 11/29/20   Roma Schanz, CNM  norethindrone-ethinyl estradiol-FE Ronnette Juniper FE 1/20) 1-20 MG-MCG tablet Take 1 tablet by mouth daily. 11/29/20   Gertie Exon,  Royetta Crochet, CNM    Family History Family History  Problem Relation Age of Onset   Thyroid disease Maternal Aunt    Heart Problems Maternal Aunt    Cancer Maternal Grandmother    Other Maternal Grandmother        brain tumor   Cancer Maternal Grandfather     Social History Social History   Tobacco Use   Smoking status: Every Day    Packs/day: 0.00    Years: 10.00    Pack years: 0.00    Types: Cigarettes   Smokeless tobacco: Never   Tobacco comments:    smokes 4-5  cig daily  Vaping Use   Vaping Use: Never used  Substance Use Topics   Alcohol use: Yes    Alcohol/week: 0.0 standard drinks    Comment: occ    Drug use: No     Allergies   Patient has no known allergies.   Review of Systems Review of Systems Per  HPI  Physical Exam Triage Vital Signs ED Triage Vitals  Enc Vitals Group     BP 01/04/21 1408 130/83     Pulse Rate 01/04/21 1408 (!) 106     Resp 01/04/21 1408 18     Temp 01/04/21 1408 (!) 100.5 F (38.1 C)     Temp Source 01/04/21 1408 Oral     SpO2 01/04/21 1408 97 %     Weight --      Height --      Head Circumference --      Peak Flow --      Pain Score 01/04/21 1406 8     Pain Loc --      Pain Edu? --      Excl. in [place]? --    No data found.  Updated Vital Signs BP 130/83 (BP Location: Right Arm)   Pulse (!) 106   Temp (!) 100.5 F (38.1 C) (Oral)   Resp 18   LMP 12/21/2020 (Approximate)   SpO2 97%   Visual Acuity Right Eye Distance:   Left Eye Distance:   Bilateral Distance:    Right Eye Near:   Left Eye Near:    Bilateral Near:     Physical Exam Vitals and nursing note reviewed.  Constitutional:      Appearance: Normal appearance.  HENT:     Head: Atraumatic.     Right Ear: Tympanic membrane and external ear normal.     Left Ear: Tympanic membrane and external ear normal.     Nose: Rhinorrhea present.     Mouth/Throat:     Mouth: Mucous membranes are moist.     Pharynx: Posterior oropharyngeal erythema present.  Eyes:     Extraocular Movements: Extraocular movements intact.     Conjunctiva/sclera: Conjunctivae normal.  Cardiovascular:     Rate and Rhythm: Normal rate and regular rhythm.     Heart sounds: Normal heart sounds.  Pulmonary:     Effort: Pulmonary effort is normal.     Breath sounds: Normal breath sounds. No wheezing.  Musculoskeletal:        General: Normal range of motion.     Cervical back: Normal range of motion and neck supple.  Skin:    General: Skin is warm and dry.  Neurological:     Mental Status: She is alert and oriented to person, place, and time.  Psychiatric:        Mood and Affect: Mood normal.  Thought Content: Thought content normal.     UC Treatments / Results  Labs (all labs ordered are listed,  but only abnormal results are displayed) Labs Reviewed  COVID-19, FLU A+B NAA    EKG   Radiology No results found.  Procedures Procedures (including critical care time)  Medications Ordered in UC Medications - No data to display  Initial Impression / Assessment and Plan / UC Course  I have reviewed the triage vital signs and the nursing notes.  Pertinent labs & imaging results that were available during my care of the patient were reviewed by me and considered in my medical decision making (see chart for details).     Febrile and tachycardic in triage, otherwise vital signs reassuring.  Suspect tachycardia secondary to fever.  Strongly suspect influenza given immune exposures and symptoms.  We will start Tamiflu while awaiting COVID, flu results.  Also treat cough with Phenergan DM in addition to over-the-counter cold and congestion medications.  Over-the-counter fever reducers and supportive home care reviewed.  Work note given.  Return for acutely worsening symptoms.  Final Clinical Impressions(s) / UC Diagnoses   Final diagnoses:  Exposure to COVID-19 virus  Viral URI with cough  Fever, unspecified  Tachycardia   Discharge Instructions   None    ED Prescriptions     Medication Sig Dispense Auth. Provider   oseltamivir (TAMIFLU) 75 MG capsule Take 1 capsule (75 mg total) by mouth every 12 (twelve) hours. 10 capsule Particia Nearing, [place]   promethazine-dextromethorphan (PROMETHAZINE-DM) 6.25-15 MG/5ML syrup Take 5 mLs by mouth 4 (four) times daily as needed for cough. 100 mL Particia Nearing, [place]      PDMP not reviewed this encounter.   Particia Nearing, [place] 01/04/21 1450

## 2021-01-04 NOTE — ED Triage Notes (Signed)
Patient states she has a really bad headache, congestion, body aches, and cough since this morning.   Denies Fever.  Denies meds.

## 2021-01-05 LAB — COVID-19, FLU A+B NAA
Influenza A, NAA: NOT DETECTED
Influenza B, NAA: NOT DETECTED
SARS-CoV-2, NAA: NOT DETECTED

## 2021-01-06 DIAGNOSIS — J029 Acute pharyngitis, unspecified: Secondary | ICD-10-CM | POA: Diagnosis not present

## 2021-01-06 DIAGNOSIS — J329 Chronic sinusitis, unspecified: Secondary | ICD-10-CM | POA: Diagnosis not present

## 2021-01-10 DIAGNOSIS — H6501 Acute serous otitis media, right ear: Secondary | ICD-10-CM | POA: Diagnosis not present

## 2021-01-13 DIAGNOSIS — H6504 Acute serous otitis media, recurrent, right ear: Secondary | ICD-10-CM | POA: Diagnosis not present

## 2021-01-22 DIAGNOSIS — H918X3 Other specified hearing loss, bilateral: Secondary | ICD-10-CM | POA: Diagnosis not present

## 2021-01-22 DIAGNOSIS — R0981 Nasal congestion: Secondary | ICD-10-CM | POA: Diagnosis not present

## 2021-01-22 DIAGNOSIS — H9209 Otalgia, unspecified ear: Secondary | ICD-10-CM | POA: Diagnosis not present

## 2021-01-22 DIAGNOSIS — H6503 Acute serous otitis media, bilateral: Secondary | ICD-10-CM | POA: Diagnosis not present

## 2021-01-30 ENCOUNTER — Encounter: Payer: Medicaid Other | Admitting: Internal Medicine

## 2021-02-02 ENCOUNTER — Other Ambulatory Visit: Payer: Self-pay

## 2021-02-02 ENCOUNTER — Ambulatory Visit: Payer: Medicaid Other | Admitting: Internal Medicine

## 2021-02-02 ENCOUNTER — Encounter: Payer: Self-pay | Admitting: Internal Medicine

## 2021-02-02 VITALS — BP 132/86 | HR 71 | Temp 98.0°F | Ht 63.0 in | Wt 138.2 lb

## 2021-02-02 DIAGNOSIS — J0100 Acute maxillary sinusitis, unspecified: Secondary | ICD-10-CM | POA: Diagnosis not present

## 2021-02-02 DIAGNOSIS — F331 Major depressive disorder, recurrent, moderate: Secondary | ICD-10-CM

## 2021-02-02 DIAGNOSIS — R61 Generalized hyperhidrosis: Secondary | ICD-10-CM

## 2021-02-02 DIAGNOSIS — F329 Major depressive disorder, single episode, unspecified: Secondary | ICD-10-CM

## 2021-02-02 MED ORDER — FLUTICASONE PROPIONATE 50 MCG/ACT NA SUSP: 2.0000 | Freq: Every day | NASAL | 0 refills | Status: DC

## 2021-02-02 NOTE — Progress Notes (Signed)
Subjective:  CC: Sinusitis  HPI:  Mary Meyer is a 30 y.o. female with a past medical history stated below and presents today for sinusitis. Please see problem based assessment and plan for additional details.  Past Medical History:  Diagnosis Date   Anxiety    Asthma    AVNRT (AV nodal re-entry tachycardia) ([facility]) 04/26/2014   Bleeding 04/08/2019   Gestational edema, postpartum 10/12/2014   Miscarriage 04/16/2016   04/16/16    Previous cesarean section 12/15/2015   S/P radiofrequency ablation operation for arrhythmia 04/26/2014   Tachycardia    Tachycardia    SVT:  ablation age 48.  Pt states she had a stent placed during the cath/ablation, but records don't reflect this.  Cards appt 04/26/14 @ [facility]: EKG & Holter monitor all returned normal per pt, no further f/u needed per cards    Urinary tract infection without hematuria 07/10/2019   Vaginal Pap smear, abnormal     Current Outpatient Medications on File Prior to Visit  Medication Sig Dispense Refill   mirabegron ER (MYRBETRIQ) 25 MG TB24 tablet Take 1 tablet (25 mg total) by mouth daily. 30 tablet 11   norethindrone-ethinyl estradiol-FE (LARIN FE 1/20) 1-20 MG-MCG tablet Take 1 tablet by mouth daily. 84 tablet 3   oseltamivir (TAMIFLU) 75 MG capsule Take 1 capsule (75 mg total) by mouth every 12 (twelve) hours. 10 capsule 0   promethazine-dextromethorphan (PROMETHAZINE-DM) 6.25-15 MG/5ML syrup Take 5 mLs by mouth 4 (four) times daily as needed for cough. 100 mL 0   No current facility-administered medications on file prior to visit.    Family History  Problem Relation Age of Onset   Thyroid disease Maternal Aunt    Heart Problems Maternal Aunt    Cancer Maternal Grandmother    Other Maternal Grandmother        brain tumor   Cancer Maternal Grandfather     Social History   Socioeconomic History   Marital status: Single    Spouse name: Not on file   Number of children: 1   Years of education: Not on  file   Highest education level: Not on file  Occupational History   Not on file  Tobacco Use   Smoking status: Every Day    Packs/day: 0.00    Years: 10.00    Pack years: 0.00    Types: Cigarettes   Smokeless tobacco: Never   Tobacco comments:    smokes 4-5  cig daily  Vaping Use   Vaping Use: Never used  Substance and Sexual Activity   Alcohol use: Yes    Alcohol/week: 0.0 standard drinks    Comment: occ    Drug use: No   Sexual activity: Yes    Birth control/protection: Pill  Other Topics Concern   Not on file  Social History Narrative   Not on file   Social Determinants of Health   Financial Resource Strain: Low Risk    Difficulty of Paying Living Expenses: Not very hard  Food Insecurity: No Food Insecurity   Worried About Programme researcher, broadcasting/film/video in the Last Year: Never true   Ran Out of Food in the Last Year: Never true  Transportation Needs: No Transportation Needs   Lack of Transportation (Medical): No   Lack of Transportation (Non-Medical): No  Physical Activity: Unknown   Days of Exercise per Week: 6 days   Minutes of Exercise per Session: Patient refused  Stress: Stress Concern Present   Feeling of  Stress : To some extent  Social Connections: Moderately Isolated   Frequency of Communication with Friends and Family: More than three times a week   Frequency of Social Gatherings with Friends and Family: More than three times a week   Attends Religious Services: Never   Marine scientist or Organizations: No   Attends Music therapist: Never   Marital Status: Living with partner  Intimate Partner Violence: Not At Risk   Fear of Current or Ex-Partner: No   Emotionally Abused: No   Physically Abused: No   Sexually Abused: No    Review of Systems: ROS negative except for what is noted on the assessment and plan.  Objective:   Vitals:   02/02/21 1317  BP: 132/86  Pulse: 71  Temp: 98 F (36.7 C)  TempSrc: Oral  SpO2: 100%  Weight: 138  lb 3.2 oz (62.7 kg)  Height: 5\' 3"  (1.6 m)    Physical Exam: Gen: A&O x3 and in no apparent distress, well appearing and nourished. HEENT:    Head - normocephalic, atraumatic. TTP of maxillary sinuses   Eye - visual acuity grossly intact, conjunctiva clear, sclera non-icteric, EOM intact.    Mouth - No obvious caries or periodontal disease. No exudate or erythema   Ear: bilateral effusion without erythema or pain    Nose - erythema and mucus present Neck: no masses or nodules, AROM intact. CV: RRR, no murmurs, S1/S2 presents  Resp: Clear to ascultation bilaterally  Abd: BS (+) x4, soft, non-tender abdomen, without hepatosplenomegaly or masses MSK: Grossly normal AROM and strength x4 extremities. Skin: good skin turgor, no rashes, unusual bruising, or prominent lesions. No obvious rashes Neuro: No focal deficits, grossly normal sensation and coordination.     Assessment & Plan:  See Encounters Tab for problem based charting.  Patient discussed with Dr. Kelle Darting, D.O. [facility]  PGY-3 Pager: [phone]  Phone: [phone] Date 02/03/2021  Time 4:36 PM

## 2021-02-02 NOTE — Patient Instructions (Signed)
Thank you, Ms.December M Woolsey for allowing [place] to provide your care today. Today we discussed sinus pain and pressure and night sweats.    Labs/Tests Ordered: Lab Orders         CMP14 + Anion Gap         CBC with Diff         TSH         Beta HCG, Quant ([facility])         HIV antibody (with reflex)      Referrals Ordered:  Referral Orders  No referral(s) requested today     Medication Changes:  There are no discontinued medications.   Meds ordered this encounter  Medications   fluticasone (FLONASE) 50 MCG/ACT nasal spray    Sig: Place 2 sprays into both nostrils daily.    Dispense:  1 g    Refill:  0     Health Maintenance Screening: There are no preventive care reminders to display for this patient.   Instructions:  - Mucinex - Tylenol and Ibuprofen - Flonase - Netti pot   Follow up:  1 month    Remember: If you have any questions or concerns, call our clinic at [phone] or after hours call [phone] and ask for the internal medicine resident on call.  Dellia Cloud, D.O. [facility]

## 2021-02-03 ENCOUNTER — Encounter: Payer: Self-pay | Admitting: Internal Medicine

## 2021-02-03 DIAGNOSIS — R61 Generalized hyperhidrosis: Secondary | ICD-10-CM | POA: Insufficient documentation

## 2021-02-03 DIAGNOSIS — J329 Chronic sinusitis, unspecified: Secondary | ICD-10-CM | POA: Insufficient documentation

## 2021-02-03 LAB — CMP14 + ANION GAP
ALT: 10 IU/L (ref 0–32)
AST: 15 IU/L (ref 0–40)
Albumin/Globulin Ratio: 1.8 (ref 1.2–2.2)
Albumin: 4.6 g/dL (ref 3.8–4.8)
Alkaline Phosphatase: 80 IU/L (ref 44–121)
Anion Gap: 16 mmol/L (ref 10.0–18.0)
BUN/Creatinine Ratio: 16 (ref 9–23)
BUN: 12 mg/dL (ref 6–20)
Bilirubin Total: <0.2 mg/dL (ref 0.0–1.2)
CO2: 20 mmol/L (ref 20–29)
Calcium: 9.4 mg/dL (ref 8.7–10.2)
Chloride: 101 mmol/L (ref 96–106)
Creatinine, Ser: 0.73 mg/dL (ref 0.57–1.00)
Globulin, Total: 2.6 g/dL (ref 1.5–4.5)
Glucose: 72 mg/dL (ref 70–99)
Potassium: 4.8 mmol/L (ref 3.5–5.2)
Sodium: 137 mmol/L (ref 134–144)
Total Protein: 7.2 g/dL (ref 6.0–8.5)
eGFR: 113 mL/min/{1.73_m2} (ref >59)

## 2021-02-03 LAB — HIV ANTIBODY (ROUTINE TESTING W REFLEX): HIV Screen 4th Generation wRfx: NONREACTIVE

## 2021-02-03 LAB — CBC WITH DIFFERENTIAL/PLATELET
Basophils Absolute: 0.1 10*3/uL (ref 0.0–0.2)
Basos: 1 %
EOS (ABSOLUTE): 0.2 10*3/uL (ref 0.0–0.4)
Eos: 2 %
Hematocrit: 42.8 % (ref 34.0–46.6)
Hemoglobin: 14.3 g/dL (ref 11.1–15.9)
Immature Grans (Abs): 0 10*3/uL (ref 0.0–0.1)
Immature Granulocytes: 0 %
Lymphocytes Absolute: 1.6 10*3/uL (ref 0.7–3.1)
Lymphs: 23 %
MCH: 31.4 pg (ref 26.6–33.0)
MCHC: 33.4 g/dL (ref 31.5–35.7)
MCV: 94 fL (ref 79–97)
Monocytes Absolute: 0.4 10*3/uL (ref 0.1–0.9)
Monocytes: 6 %
Neutrophils Absolute: 4.8 10*3/uL (ref 1.4–7.0)
Neutrophils: 68 %
Platelets: 237 10*3/uL (ref 150–450)
RBC: 4.55 x10E6/uL (ref 3.77–5.28)
RDW: 12.7 % (ref 11.7–15.4)
WBC: 7.1 10*3/uL (ref 3.4–10.8)

## 2021-02-03 LAB — BETA HCG QUANT (REF LAB): hCG Quant: <1 m[IU]/mL

## 2021-02-03 LAB — TSH: TSH: 0.81 u[IU]/mL (ref 0.450–4.500)

## 2021-02-03 MED ORDER — ESCITALOPRAM OXALATE 20 MG PO TABS: 20.0000 mg | ORAL_TABLET | Freq: Every day | ORAL | 2 refills | Status: DC

## 2021-02-03 MED ORDER — ESCITALOPRAM OXALATE 10 MG PO TABS: 10.0000 mg | ORAL_TABLET | Freq: Every day | ORAL | 0 refills | Status: DC

## 2021-02-03 NOTE — Assessment & Plan Note (Signed)
Patient presents for evaluation of her upper respiratory symptoms. She states that she developed fever, chills, and myalgias around 11/09. She went to the urgent care and was tested for the the Flu and started on Tamiflu. Results came back negative for Flu. She continued to have symptoms and returned to the urgent care several times during which she given multiple courses of antibiotics including azithromycin, amoxicillin, and bactrim (First single strength then DS) for otitis media and bacterial sinusitis.   On presentation today, she continues to have ear fullness, sinus pressure, decreased hearing, rhinorrhea, and productive cough. On exam she has fluid behind her tympanic membranes bilaterally without obvious erythema or edema. He nose shows some erythema and mucus and her throat is clear. Her lung sounds were WNL.  Assessment/Plan: Sinusitis with possibly an allergic component.  - Flonase - Antihistamine - Netti pot - Mucinex - If symptoms dont improve then she may need a referral to an ENT

## 2021-02-03 NOTE — Assessment & Plan Note (Addendum)
HPI: Patient presents evaluation and management of night sweats. They have been present for many months now. Patient states that she will wake up every night due to sweating through her clothes. She denies any weight loss, change in appetite, fevers, headaches, SHOB, chest pain, abnormal uterine bleeding. She had a recent bacterial sinusitis, but otherwise denies multiple or chronic infections. She has a history of irregular menses but has been on OCPs since she was 30 yo and has normal uterine bleeding on her off days with her OCP. She denies tobacco use or significant exposures. She has a family history of cancer in her maternal grandfather and grandmother. She does endorse thyroid disease in her cousin. She denies any new medications but is taking an SSRI and OCP which could be contributing to her symptoms. She denies any obvious rash, lesions, or mass. She denies any substance use or multiple sexual partners. She denies any high risk exposure that would put her at risk for TB.    Assessment/Plan: Chronic Night Sweats of unknown etiology - will perform basic lab work up today.  - CBC w/ dif - CMP - TSH and bHCG - HIV - Consider CRP at follow up - Consider discontinuation of OCP and test for premature ovarian failure  - Depending on results of the basic lab work, consider imaging (CT chest, abdomen, pelvis)

## 2021-02-06 ENCOUNTER — Telehealth: Payer: Self-pay | Admitting: *Deleted

## 2021-02-06 DIAGNOSIS — J0100 Acute maxillary sinusitis, unspecified: Secondary | ICD-10-CM

## 2021-02-06 MED ORDER — BENZONATATE 100 MG PO CAPS
100.0000 mg | ORAL_CAPSULE | Freq: Two times a day (BID) | ORAL | 0 refills | Status: AC
Start: 2021-02-06 — End: 2021-02-16

## 2021-02-06 NOTE — Telephone Encounter (Signed)
Call from patient stated that her prescriptions for Lexapro 10 and 20 mg  tablets have not been sent to her pharmacy.  Said that Pharmacy never received.  Also had questions about a Cough Syrup that was to be sent.  Call to [facility] in [place].  Prescriptions for the Lexapro were received the Lexapro 10 mg prescription is awaiting pick up from patient.  The Lexapro 20 is to early can be filled on 02/22/2021.Patient said that the label on the bottle said that she did not have refills.  Patient told that new prescription is at Pharmacy.  Last pick up was 01/30/2021. No prescription was found for the Cough Syrup.  Message to be sent to Dr. Marchia Bond to see if the prescription needs to be sent.

## 2021-02-06 NOTE — Telephone Encounter (Signed)
Tessalon pearls put into pharmacy today.

## 2021-02-06 NOTE — Progress Notes (Signed)
Internal Medicine Clinic Attending  Case discussed with Dr. Coe  At the time of the visit.  We reviewed the resident's history and exam and pertinent patient test results.  I agree with the assessment, diagnosis, and plan of care documented in the resident's note.  

## 2021-02-10 ENCOUNTER — Ambulatory Visit (INDEPENDENT_AMBULATORY_CARE_PROVIDER_SITE_OTHER): Payer: Medicaid Other | Admitting: Internal Medicine

## 2021-02-10 ENCOUNTER — Telehealth: Payer: Self-pay

## 2021-02-10 ENCOUNTER — Encounter: Payer: Self-pay | Admitting: Internal Medicine

## 2021-02-10 DIAGNOSIS — J0100 Acute maxillary sinusitis, unspecified: Secondary | ICD-10-CM

## 2021-02-10 NOTE — Progress Notes (Signed)
I connected with  Mary Meyer on 02/10/21 by a video enabled telemedicine application and verified that I am speaking with the correct person using two identifiers.   I discussed the limitations of evaluation and management by telemedicine. The patient expressed understanding and agreed to proceed.   Patient continues to have symptoms she was seen for last week consistent with sinusitis. She has not improved on multiple antibiotics as detailed in Dr.Coe's note from 02/03/2021 and no improving with treatment of allergic component. She was at work and asked if she could be reschedule at 5pm. Unable to reach her at 5pm. Will ask for patient to be rescheduled for in person visit Monday or Tuesday.

## 2021-02-10 NOTE — Telephone Encounter (Signed)
Tele appt given today at 1345.

## 2021-02-10 NOTE — Telephone Encounter (Signed)
Pt states she is not feeling better, her eyes are red and green mucus are coming out.Per pt nothing has changed since she had a visit with Dr. Marchia Bond. Please call pt back.

## 2021-03-06 ENCOUNTER — Ambulatory Visit: Payer: Medicaid Other | Admitting: Internal Medicine

## 2021-03-06 VITALS — BP 129/86 | HR 75 | Temp 98.1°F | Wt 138.0 lb

## 2021-03-06 DIAGNOSIS — J0100 Acute maxillary sinusitis, unspecified: Secondary | ICD-10-CM

## 2021-03-06 DIAGNOSIS — R61 Generalized hyperhidrosis: Secondary | ICD-10-CM

## 2021-03-06 NOTE — Progress Notes (Signed)
° °  CC: Night Sweats, Sinusitis follow up  HPI:  [name] is a 31 y.o. person, with a PMH noted below, who presents to the clinic for follow up on her night sweats and sinusitis follow up. To see the management of their acute and chronic conditions, please see the A&P note under the Encounters tab.   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  Pt states she had a stent placed during the cath/ablation, but records don't reflect this.  Cards appt 04/26/14 @ [facility]: EKG & Holter monitor all returned normal per pt, no further f/u needed per cards    Urinary tract infection without hematuria 07/10/2019   Vaginal Pap smear, abnormal    Review of Systems:   Review of Systems  Constitutional:  Positive for diaphoresis. Negative for chills, fever, malaise/fatigue and weight loss.  HENT:  Positive for congestion, ear pain and sinus pain. Negative for ear discharge, nosebleeds and sore throat.   Eyes:  Negative for blurred vision and double vision.  Respiratory:  Negative for cough and stridor.   Gastrointestinal:  Negative for abdominal pain, diarrhea, nausea and vomiting.  Skin:  Negative for rash.  Neurological:  Negative for dizziness, tingling and headaches.    Physical Exam:  Vitals:   03/06/21 1319  BP: 129/86  Pulse: 75  Temp: 98.1 F (36.7 C)  TempSrc: Oral  SpO2: 100%  Weight: 138 lb (62.6 kg)   Physical Exam Constitutional:      General: She is not in acute distress.    Appearance: Normal appearance. She is not ill-appearing, toxic-appearing or diaphoretic.  HENT:     Head: Normocephalic and atraumatic.     Right Ear: Ear canal  and external ear normal. There is no impacted cerumen.     Left Ear: Ear canal and external ear normal. There is no impacted cerumen.     Ears:     Comments: Serous effusions bilaterally     Nose: Congestion present. No rhinorrhea.     Mouth/Throat:     Mouth: Mucous membranes are moist.     Pharynx: No oropharyngeal exudate or posterior oropharyngeal erythema.  Eyes:     General: No scleral icterus.       Right eye: No discharge.        Left eye: No discharge.     Conjunctiva/sclera: Conjunctivae normal.  Cardiovascular:     Rate and Rhythm: Normal rate and regular rhythm.  Pulmonary:     Effort: Pulmonary effort is normal.     Breath sounds: Normal breath sounds.  Neurological:     Mental Status: She is alert.     Assessment & Plan:   See Encounters Tab for problem based charting.  Patient discussed with Dr. Jimmye Norman

## 2021-03-06 NOTE — Patient Instructions (Signed)
Ms. Mary Meyer,   It was a pleasure seeing you today. Today we discussed your night sweats and recurrent sinus issues. For the night sweats, we will order a lab called a CRP, which will help [place] delineate our next steps. For your recurrent sinus issues, I will have you see an ENT for further evaluation.  Have a good day!  Mary Meyer,

## 2021-03-07 ENCOUNTER — Encounter: Payer: Self-pay | Admitting: Internal Medicine

## 2021-03-07 LAB — C-REACTIVE PROTEIN: CRP: 1 mg/L (ref 0–10)

## 2021-03-07 NOTE — Assessment & Plan Note (Addendum)
Patient presents to the clinic for reevaluation of her sinusitis. Her symptoms of ear fullness, fevers, maxillary pain have been ongoing since 11/22. She was initially tested for the flu and completed a course of tamiflu, but the testing was negative. She then had several urgent care visits and has underwent treatment with a variety of antibiotics including azithromycin, amoxicillin, as well as two courses of Bactrim, single then double strength. She presented to the clinic with some maxillary pain on 02/03/21 and was started on Flonase, an antihistamine, netti pot, and Mucinex.   On today's visit, the patient states that she has not had any fevers since the end of December, but she continues to have ear pain and a sensation of fullness. She is compliant with her medications, and has had little alleviation of her symptoms.   A/P:  Patient presents for further management of her symptoms. On today's examination there is serous fluid behind the TM, there is no erythema of the canal. There is no maxillary tenderness. There is nasal congestions present. Will have the patient follow with ENT for further evaluation.  - Continue current regimen - ENT referral placed

## 2021-03-07 NOTE — Progress Notes (Signed)
Internal Medicine Clinic Attending  Case discussed with Dr. Gilford Rile  At the time of the visit.  We reviewed the residents history and exam and pertinent patient test results.  I agree with the assessment, diagnosis, and plan of care documented in the residents note.  Agree that SSRI is most likely cause of nightsweats, given chronicity and no other localizing findings.  Current lexapro dose exceeds usual max daily dose; she is also experiencing decreased libido which can be a side effect.  In future, consider reducing the escitalopram and adding buproprion (which typically doesn't affect libido).

## 2021-03-07 NOTE — Assessment & Plan Note (Addendum)
Patient presents to the clinic today for follow up on her chronic night sweats. Her prior labs were unremarkable and the patient is continuing to have nightly symptoms. We discussed possible etiologies, especially medications as she is on an SSRI and OCP, and her SSRI is above the maximum dose. Patient states that her symptoms are unlikely due to her Lexapro as she has not been on this medication for years and would like to continue it as this is the first medication that has adequately treated her depression/anxiety. We additionally discussed additional blood testing today, which she is interested in undergoing. Pending the results of the test we will have a continued discussion on management.   A/P:  Patient presents with chronic night sweats. Her most recent workup was unremarkable. She is on several medications that could be causing her symptoms including an SSRI and OCP. While the patient states it is unlikely her SSRI could be the culprit, she has been on multiple antidepressants including, Cymbalta, Buspirone, Lexapro (in 2018), and trazadone in the past. Will include a CRP this visits for completion of a basic work up. Will schedule a telehealth visit to further discuss next steps. Ideally, if CRP is negative, we could decrease the dose of her SSRI and consider DC her OCP.  - CRP  - F/U Telehealth visit to discuss results

## 2021-03-10 ENCOUNTER — Ambulatory Visit (INDEPENDENT_AMBULATORY_CARE_PROVIDER_SITE_OTHER): Payer: Medicaid Other | Admitting: Internal Medicine

## 2021-03-10 DIAGNOSIS — R61 Generalized hyperhidrosis: Secondary | ICD-10-CM

## 2021-03-11 MED ORDER — BUPROPION HCL ER (SR) 100 MG PO TB12: 100.0000 mg | ORAL_TABLET | Freq: Two times a day (BID) | ORAL | 2 refills | Status: DC

## 2021-03-11 NOTE — Progress Notes (Signed)
°  [facility] Internal Medicine Residency Telephone Encounter Continuity Care Appointment  HPI:  This telephone encounter was created for Ms. Mary Meyer on 03/11/2021 for the following purpose/cc follow up on chronic night sweats.   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  Pt states she had a stent placed during the cath/ablation, but records don't reflect this.  Cards appt 04/26/14 @ [facility]: EKG & Holter monitor all returned normal per pt, no further f/u needed per cards    Urinary tract infection without hematuria 07/10/2019   Vaginal Pap smear, abnormal      ROS:  Review of Systems  Constitutional:  Positive for diaphoresis. Negative for chills, fever, malaise/fatigue and weight loss.       Nighttime sweating  Eyes:  Negative for blurred vision, double vision and photophobia.  Respiratory:  Negative for cough, hemoptysis and sputum production.   Cardiovascular:  Negative for chest pain, palpitations and orthopnea.  Gastrointestinal:  Negative for abdominal pain, constipation, diarrhea, nausea and vomiting.  Neurological:  Negative for dizziness and headaches.    Assessment / Plan / Recommendations:  Please see A&P under problem oriented charting for assessment of the patient's acute and chronic medical conditions.  As always, pt is advised that if symptoms worsen or new symptoms arise, they should go to an urgent care facility or to to ER for further evaluation.   Consent and Medical Decision Making:  Patient discussed with Dr. Heber [place] This is a telephone encounter between Fidel Levy and Maudie Mercury on  03/11/2021 for Night sweats. The visit was conducted with the patient located at home and Maudie Mercury at [facility]. The patient's identity was confirmed using their DOB and current address. The patient has consented to being evaluated through a telephone encounter and understands the associated risks (an examination cannot be done and the patient may need to come in for an appointment) / benefits (allows the patient to remain at home, decreasing exposure to coronavirus). I personally spent 15 minutes on medical discussion.

## 2021-03-11 NOTE — Assessment & Plan Note (Addendum)
Patient presents for follow up on her chronic night sweats. We discussed her negative CRP and next steps, which is to wean medications with known side effects of diaphoresis which are her Lexapro and OCP. Mary Meyer is hesitant to discontinue either, especially as the Lexapro regimen of 30 mg daily is working for her. We discussed modifying her regimen to include bupropion, which has a decreased side effect profile (and also does not have a decrease in sexual libido as SSRI) as well as alleviate her decrease in libido. We also discussed watchful waiting to see if other symptoms present themselves during her night sweats. Patient is agreeable to stop taking her Lexapro 10 mg and start bupropion.   - Discontinue Lexapro 10 mg - Continue Lexapro 20 mg - Start Buproprion 100 mg BID - F/U with PCP

## 2021-03-15 NOTE — Progress Notes (Signed)
Internal Medicine Clinic Attending ? ?Case discussed with Dr. Winters  At the time of the visit.  We reviewed the resident?s history and exam and pertinent patient test results.  I agree with the assessment, diagnosis, and plan of care documented in the resident?s note.  ?

## 2021-03-22 ENCOUNTER — Other Ambulatory Visit: Payer: Self-pay

## 2021-03-22 DIAGNOSIS — F329 Major depressive disorder, single episode, unspecified: Secondary | ICD-10-CM

## 2021-03-22 DIAGNOSIS — F331 Major depressive disorder, recurrent, moderate: Secondary | ICD-10-CM

## 2021-03-22 MED ORDER — ESCITALOPRAM OXALATE 10 MG PO TABS: 10.0000 mg | ORAL_TABLET | Freq: Every day | ORAL | 2 refills | Status: DC

## 2021-03-22 MED ORDER — ESCITALOPRAM OXALATE 20 MG PO TABS: 20.0000 mg | ORAL_TABLET | Freq: Every day | ORAL | 2 refills | Status: DC

## 2021-03-22 NOTE — Telephone Encounter (Signed)
escitalopram (LEXAPRO) 10 MG tablet (Expired)  escitalopram (LEXAPRO) 20 MG tablet, refill request @ [facility], [place] - [phone]

## 2021-04-28 DIAGNOSIS — J329 Chronic sinusitis, unspecified: Secondary | ICD-10-CM | POA: Diagnosis not present

## 2021-04-28 DIAGNOSIS — R519 Headache, unspecified: Secondary | ICD-10-CM | POA: Diagnosis not present

## 2021-04-28 DIAGNOSIS — H9203 Otalgia, bilateral: Secondary | ICD-10-CM | POA: Diagnosis not present

## 2021-05-03 ENCOUNTER — Ambulatory Visit (INDEPENDENT_AMBULATORY_CARE_PROVIDER_SITE_OTHER): Payer: Medicaid Other | Admitting: Internal Medicine

## 2021-05-03 DIAGNOSIS — F329 Major depressive disorder, single episode, unspecified: Secondary | ICD-10-CM | POA: Diagnosis not present

## 2021-05-03 DIAGNOSIS — F331 Major depressive disorder, recurrent, moderate: Secondary | ICD-10-CM

## 2021-05-03 DIAGNOSIS — R61 Generalized hyperhidrosis: Secondary | ICD-10-CM | POA: Diagnosis not present

## 2021-05-03 DIAGNOSIS — F419 Anxiety disorder, unspecified: Secondary | ICD-10-CM

## 2021-05-03 NOTE — Progress Notes (Signed)
° °  I connected with  Mary Meyer on 05/04/21 by telephone and verified that I am speaking with the correct person using two identifiers.   I discussed the limitations of evaluation and management by telemedicine. The patient expressed understanding and agreed to proceed.  CC: Night sweats  This is a telephone encounter between Mary Meyer and Mary Meyer on 05/04/2021 for night sweats. The visit was conducted with the patient located at home and Mary Meyer at [facility]. The patient's identity was confirmed using their DOB and current address. The patient has consented to being evaluated through a telephone encounter and understands the associated risks (an examination cannot be done and the patient may need to come in for an appointment) / benefits (allows the patient to remain at home, decreasing exposure to coronavirus). I personally spent 18 minutes on medical discussion.   HPI:  [name] is a 31 y.o. with PMH as below.   Please see A&P for assessment of the patient's acute and chronic medical conditions.   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  Pt states she had a stent placed during the cath/ablation, but records don't reflect this.  Cards appt 04/26/14 @ [facility]: EKG & Holter monitor all returned normal per pt, no further f/u needed per cards    Urinary tract infection without hematuria 07/10/2019   Vaginal Pap smear, abnormal    Review of Systems:  see assessment and plane  Assessment & Plan:   See Encounters Tab for problem based charting.  Patient discussed with Dr.  Lafonda Mosses

## 2021-05-04 ENCOUNTER — Encounter: Payer: Self-pay | Admitting: Internal Medicine

## 2021-05-04 MED ORDER — ESCITALOPRAM OXALATE 10 MG PO TABS: 10.0000 mg | ORAL_TABLET | Freq: Every day | ORAL | 2 refills | Status: DC

## 2021-05-04 MED ORDER — BUPROPION HCL ER (XL) 150 MG PO TB24: 150.0000 mg | ORAL_TABLET | Freq: Two times a day (BID) | ORAL | 2 refills | Status: DC

## 2021-05-04 NOTE — Assessment & Plan Note (Signed)
Patient presents for further evaluation of chronic night sweats. She recently decreased her lexapro to 20 mg daily and started Wellbutrin 100 mg BID without improvement of her symptoms. All of her lab work to date has been unremarkable. She continues to deny any fevers, chills, weakness, weight loss, rash, joint pain, abdominal pain, cough, SHOB,  dysphagia, lumps or masses. She denies substance use, recent travel outside of the [place], new tattoos, or risk sexual encounters.  ? ?Plans: ?- Counseled her on decreasing lexapro to 10 mg  ?- Increase Wellbutrin to 150 mg BID ?- Will get CT scan w/ contrast of her chest search for intrathoracic pathology. ?

## 2021-05-16 DIAGNOSIS — J341 Cyst and mucocele of nose and nasal sinus: Secondary | ICD-10-CM | POA: Diagnosis not present

## 2021-05-16 DIAGNOSIS — J329 Chronic sinusitis, unspecified: Secondary | ICD-10-CM | POA: Diagnosis not present

## 2021-05-16 DIAGNOSIS — R519 Headache, unspecified: Secondary | ICD-10-CM | POA: Diagnosis not present

## 2021-05-30 ENCOUNTER — Ambulatory Visit
Admission: EM | Admit: 2021-05-30 | Discharge: 2021-05-30 | Disposition: A | Discharge status: Home / Self Care | Payer: Medicaid Other | Attending: Family Medicine | Admitting: Family Medicine

## 2021-05-30 DIAGNOSIS — J029 Acute pharyngitis, unspecified: Secondary | ICD-10-CM | POA: Insufficient documentation

## 2021-05-30 DIAGNOSIS — Z20828 Contact with and (suspected) exposure to other viral communicable diseases: Secondary | ICD-10-CM | POA: Insufficient documentation

## 2021-05-30 DIAGNOSIS — J069 Acute upper respiratory infection, unspecified: Secondary | ICD-10-CM | POA: Insufficient documentation

## 2021-05-30 LAB — POCT RAPID STREP A (OFFICE): Rapid Strep A Screen: NEGATIVE

## 2021-05-30 MED ORDER — PREDNISONE 20 MG PO TABS: 40.0000 mg | ORAL_TABLET | Freq: Every day | ORAL | 0 refills | Status: DC

## 2021-05-30 MED ORDER — PROMETHAZINE-DM 6.25-15 MG/5ML PO SYRP: 5.0000 mL | ORAL_SOLUTION | Freq: Four times a day (QID) | ORAL | 0 refills | Status: DC | PRN

## 2021-05-30 NOTE — ED Triage Notes (Signed)
Pt states she thought she was just having allergies since Friday night ? ?Pt states she woke up Sunday with sore throat and not able to smell or taste anything and both ears are aching ? ?Pt states she took a Covid test this morning and it was negative ? ?Pt states she has been taking Mucinex without any relief ? ?Denies Fever ? ? ?

## 2021-05-30 NOTE — ED Provider Notes (Signed)
?[facility] ? ? ? ?CSN: [id number] ?Arrival date & time: 05/30/21  1230 ? ?  ? ?History   ?Chief Complaint ?Chief Complaint  ?Patient presents with  ? Sore Throat  ?  Loss of taste and sore throat  ? ?HPI ?Mary Meyer is a 31 y.o. female.  ? ?Presenting today with 3-day history of worsening sore throat, nasal congestion, loss of taste or smell, ear pressure and popping.  Denies fever, chills, chest pain, shortness of breath, abdominal pain, nausea vomiting or diarrhea.  History of seasonal allergies on antihistamines as needed.  No known recent sick contacts.  Taking Mucinex with no relief.  COVID test was negative this morning. ? ? ?Past Medical History:  ?Diagnosis Date  ? Anxiety   ? Asthma   ? 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 Bleeding 04/08/2019  ? Gestational edema, postpartum 10/12/2014  ? Miscarriage 04/16/2016  ? 04/16/16   ? Previous cesarean section 12/15/2015  ? S/P radiofrequency ablation operation for arrhythmia 04/26/2014  ? Tachycardia   ? Tachycardia   ? SVT:  ablation age 29.  Pt states she had a stent placed during the cath/ablation, but records don't reflect this.  Cards appt 04/26/14 @ [facility]: EKG & Holter monitor all returned normal per pt, no further f/u needed per cards   ? Urinary tract infection without hematuria 07/10/2019  ? Vaginal Pap smear, abnormal   ? ? ?Patient Active Problem List  ? Diagnosis Date Noted  ? Sinusitis 02/03/2021  ? Chronic night sweats 02/03/2021  ? Back pain 08/19/2020  ? 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 Seborrheic dermatitis 07/18/2020  ? Alcohol use disorder, mild, abuse 07/18/2020  ? Tobacco use 05/02/2020  ? Depression 04/24/2019  ? Urinary incontinence in female 03/22/2017  ? Anxiety 12/15/2015  ? Abnormal Pap smear of cervix 05/07/2014  ? 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 ? ?Past Surgical History:  ?Procedure Laterality Date  ? CARDIAC CATHETERIZATION  2009  ? states due to tacycardia, had alblation   ? CARDIAC CATHETERIZATION  2009  ? CESAREAN SECTION N/A 10/03/2014  ? Procedure: CESAREAN SECTION;  Surgeon: Adam Phenix, MD;  Location: [facility];  Service: Obstetrics;  Laterality: N/A;  ? ?OB History   ? ? Gravida  ?3  ? Para  ?1  ? Term  ?1  ? Preterm  ?   ? AB  ?1  ? Living  ?1  ?  ? ? SAB  ?1  ? IAB  ?   ? Ectopic  ?   ? Multiple  ?0  ? Live Births  ?1  ?   ?  ?  ? ? ? ?Home Medications   ? ?Prior to Admission medications   ?Medication Sig Start Date End Date Taking? Authorizing Provider  ?predniSONE (DELTASONE) 20 MG tablet Take 2 tablets (40 mg total) by mouth daily with breakfast. 05/30/21  Yes Particia Nearing, PA-C  ?promethazine-dextromethorphan (PROMETHAZINE-DM) 6.25-15 MG/5ML syrup Take 5 mLs by mouth 4 (four) times daily as needed. 05/30/21  Yes Particia Nearing, PA-C  ?buPROPion (WELLBUTRIN XL) 150 MG 24 hr tablet Take 1 tablet (150 mg total) by mouth 2 (two) times daily. 05/04/21 08/02/21  Dellia Cloud, MD  ?escitalopram (LEXAPRO) 10 MG tablet Take 1 tablet (10 mg total) by mouth daily. 05/04/21 08/02/21  Dellia Cloud, MD  ?fluticasone (FLONASE) 50 MCG/ACT nasal spray Place 2 sprays into both nostrils daily. 02/02/21 03/04/21  Dellia Cloud, MD  ?  mirabegron ER (MYRBETRIQ) 25 MG TB24 tablet Take 1 tablet (25 mg total) by mouth daily. 11/29/20   Cheral MarkerBooker, Kimberly R, CNM  ?norethindrone-ethinyl estradiol-FE Elmarie Shiley(LARIN FE 1/20) 1-20 MG-MCG tablet Take 1 tablet by mouth daily. 11/29/20   Cheral MarkerBooker, Kimberly R, CNM  ?promethazine-dextromethorphan (PROMETHAZINE-DM) 6.25-15 MG/5ML syrup Take 5 mLs by mouth 4 (four) times daily as needed for cough. 01/04/21   Particia NearingLane, Rachael Ferrie Elizabeth, PA-C  ? ? ?Family History ?Family History  ?Problem Relation Age of Onset  ? Thyroid disease Maternal Aunt   ? Heart Problems Maternal Aunt   ? Cancer Maternal Grandmother   ? Other Maternal Grandmother   ?     brain tumor  ? Cancer Maternal Grandfather   ? ? ?Social History ?Social History  ? ?Tobacco Use  ? Smoking status: Former  ?   Packs/day: 0.00  ?  Years: 10.00  ?  Pack years: 0.00  ?  Types: Cigarettes  ?  Passive exposure: Never  ? Smokeless tobacco: Never  ? Tobacco comments:  ?  smokes 4-5  cig daily  ?Vaping Use  ? Vaping Use: Every day  ? Substances: Nicotine, Flavoring  ?Substance Use Topics  ? Alcohol use: Yes  ?  Alcohol/week: 0.0 standard drinks  ?  Comment: occ   ? Drug use: No  ? ? ? ?Allergies   ?Patient has no known allergies. ? ? ?Review of Systems ?Review of Systems ?Per HPI ? ?Physical Exam ?Triage Vital Signs ?ED Triage Vitals  ?Enc Vitals Group  ?   BP 05/30/21 1424 130/88  ?   Pulse Rate 05/30/21 1424 87  ?   Resp 05/30/21 1424 20  ?   Temp 05/30/21 1424 98 ?F (36.7 ?C)  ?   Temp Source 05/30/21 1424 Oral  ?   SpO2 05/30/21 1424 99 %  ?   Weight --   ?   Height --   ?   Head Circumference --   ?   Peak Flow --   ?   Pain Score 05/30/21 1420 5  ?   Pain Loc --   ?   Pain Edu? --   ?   Excl. in GC? --   ? ?No data found. ? ?Updated Vital Signs ?BP 130/88 (BP Location: Right Arm)   Pulse 87   Temp 98 ?F (36.7 ?C) (Oral)   Resp 20   LMP 05/27/2021 (Exact Date)   SpO2 99%  ? ?Visual Acuity ?Right Eye Distance:   ?Left Eye Distance:   ?Bilateral Distance:   ? ?Right Eye Near:   ?Left Eye Near:    ?Bilateral Near:    ? ?Physical Exam ?Vitals and nursing note reviewed.  ?Constitutional:   ?   Appearance: Normal appearance.  ?HENT:  ?   Head: Atraumatic.  ?   Right Ear: Tympanic membrane and external ear normal.  ?   Left Ear: Tympanic membrane and external ear normal.  ?   Nose: Rhinorrhea present.  ?   Mouth/Throat:  ?   Mouth: Mucous membranes are moist.  ?   Pharynx: Posterior oropharyngeal erythema present.  ?Eyes:  ?   Extraocular Movements: Extraocular movements intact.  ?   Conjunctiva/sclera: Conjunctivae normal.  ?Cardiovascular:  ?   Rate and Rhythm: Normal rate and regular rhythm.  ?   Heart sounds: Normal heart sounds.  ?Pulmonary:  ?   Effort: Pulmonary effort is normal.  ?   Breath sounds: Normal breath  sounds. No wheezing or  rales.  ?Musculoskeletal:     ?   General: Normal range of motion.  ?   Cervical back: Normal range of motion and neck supple.  ?Skin: ?   General: Skin is warm and dry.  ?Neurological:  ?   Mental Status: She is alert and oriented to person, place, and time.  ?Psychiatric:     ?   Mood and Affect: Mood normal.     ?   Thought Content: Thought content normal.  ? ? ? ?UC Treatments / Results  ?Labs ?(all labs ordered are listed, but only abnormal results are displayed) ?Labs Reviewed  ?COVID-19, FLU A+B NAA  ?CULTURE, GROUP A STREP [facility])  ?POCT RAPID STREP A (OFFICE)  ? ? ?EKG ? ? ?Radiology ?No results found. ? ?Procedures ?Procedures (including critical care time) ? ?Medications Ordered in UC ?Medications - No data to display ? ?Initial Impression / Assessment and Plan / UC Course  ?I have reviewed the triage vital signs and the nursing notes. ? ?Pertinent labs & imaging results that were available during my care of the patient were reviewed by me and considered in my medical decision making (see chart for details). ? ?  ? ?Suspect viral upper respiratory infection, rapid strep negative, throat culture and COVID PCR pending.  Treat with prednisone course, Phenergan DM and supportive over-the-counter medications and home care.  Return for any acutely worsening symptoms. ? ?Final Clinical Impressions(s) / UC Diagnoses  ? ?Final diagnoses:  ?Exposure to the flu  ?Sore throat  ?Viral URI with cough  ? ?Discharge Instructions   ?None ?  ? ?ED Prescriptions   ? ? Medication Sig Dispense Auth. Provider  ? predniSONE (DELTASONE) 20 MG tablet Take 2 tablets (40 mg total) by mouth daily with breakfast. 10 tablet Particia Nearing, PA-C  ? promethazine-dextromethorphan (PROMETHAZINE-DM) 6.25-15 MG/5ML syrup Take 5 mLs by mouth 4 (four) times daily as needed. 100 mL Particia Nearing, [place]  ? ?  ? ?PDMP not reviewed this encounter. ?  ?Particia Nearing, PA-C ?05/30/21 1936 ? ?

## 2021-06-01 ENCOUNTER — Encounter: Payer: Medicaid Other | Admitting: Internal Medicine

## 2021-06-01 LAB — COVID-19, FLU A+B NAA
Influenza A, NAA: NOT DETECTED
Influenza B, NAA: NOT DETECTED
SARS-CoV-2, NAA: NOT DETECTED

## 2021-06-02 LAB — CULTURE, GROUP A STREP (THRC)

## 2021-07-18 ENCOUNTER — Other Ambulatory Visit: Payer: Self-pay

## 2021-07-18 ENCOUNTER — Encounter: Payer: Self-pay | Admitting: Internal Medicine

## 2021-07-18 ENCOUNTER — Ambulatory Visit: Payer: Medicaid Other | Admitting: Internal Medicine

## 2021-07-18 DIAGNOSIS — W57XXXA Bitten or stung by nonvenomous insect and other nonvenomous arthropods, initial encounter: Secondary | ICD-10-CM

## 2021-07-18 DIAGNOSIS — R61 Generalized hyperhidrosis: Secondary | ICD-10-CM | POA: Diagnosis not present

## 2021-07-18 DIAGNOSIS — F331 Major depressive disorder, recurrent, moderate: Secondary | ICD-10-CM | POA: Diagnosis not present

## 2021-07-18 DIAGNOSIS — S70261A Insect bite (nonvenomous), right hip, initial encounter: Secondary | ICD-10-CM | POA: Diagnosis not present

## 2021-07-18 DIAGNOSIS — F329 Major depressive disorder, single episode, unspecified: Secondary | ICD-10-CM

## 2021-07-18 MED ORDER — ESCITALOPRAM OXALATE 10 MG PO TABS: 10.0000 mg | ORAL_TABLET | Freq: Every day | ORAL | 3 refills | Status: DC

## 2021-07-18 NOTE — Progress Notes (Unsigned)
CC: Night Sweats, tick bite check, and medication refills  HPI:  Mary Meyer is a 31 y.o. person, with a PMH noted below, who presents to the clinic for follow up on her . To see the management of their acute and chronic conditions, please see the A&P note under the Encounters tab.   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  Pt states she had a stent placed during the cath/ablation, but records don't reflect this.  Cards appt 04/26/14 @ [facility]: EKG & Holter monitor all returned normal per pt, no further f/u needed per cards    Urinary tract infection without hematuria 07/10/2019   Vaginal Pap smear, abnormal    Review of Systems:   Review of Systems  Constitutional:  Negative for chills, diaphoresis, fever, malaise/fatigue and weight loss.  Musculoskeletal:  Negative for myalgias.  Skin:  Positive for itching. Negative for rash.    Physical Exam:  Vitals:   07/18/21 1407  BP: (!) 129/94  Pulse: 69  Temp: 98.2 F (36.8 C)  TempSrc: Oral  SpO2: 99%  Weight: 139 lb 8 oz (63.3 kg)  Height: 5\' 3"  (1.6 m)   Physical Exam Constitutional:      General: She is not in acute distress.    Appearance: Normal appearance. She is not ill-appearing or toxic-appearing.  Cardiovascular:     Rate and Rhythm: Normal rate and regular rhythm.     Pulses: Normal pulses.     Heart sounds: Normal heart sounds. No murmur heard.   No friction rub. No gallop.  Skin:    Findings: No bruising, erythema, lesion or rash.     Comments: Induration at the R hip, no retained parts of arthropod, no local rash appreciated. No purulence, drainage, or erythema.    Neurological:     Mental Status: She is alert and oriented to person, place, and time.  Psychiatric:        Mood and Affect: Mood normal.        Behavior: Behavior normal.     Assessment & Plan:   See Encounters Tab for problem based charting.  Patient discussed with Dr.  Chronic night sweats in the setting of SSRI Patient with extensive workup, barring CT Chest and Abdomen as this was denied x3, presents today for follow up. Mary Meyer states that after decreasing her dose of Lexapro to 10 mg, she has since stopped having night sweats. She states that while it is nice to know the underlying cause of her night sweats she would rather continue her prior dose of Lexapro as this has managed her depression.  - Will continue Lexapro despite side effect profile, patient voices understanding of known side effect  Tick bite Patient presents to the clinic for evaluation of a tick bite she had Sunday evening. She states that on Sunday evening, while in bed, she noticed intense itching on her right hip and felt a tick. She was able to pluck the tick, but cannot recall what it looks like. She is concerned that there may be retained parts at the site. She is unsure how long the  tick was [place] been bad this year. She denies any fevers, rashes, or fatigue. She does describe pruritis at the site of the bite. She has not tried anything for relief.  A/P:   Patient presents today for evaluation of a tick bite. On evaluation there are no retained tick parts at the site, there is a local induration, but no sign on erythema, rash, purulence or drainage. Patient was unsure of timing of tick bite, identification of the tick, or timing of the bite. She is outside the 72 hours for prophylaxis. Given lack of rash, low concern for tick bourne illness. Will treat conservatively.  - Zyrtec as needed for pruritis - Cortizone 10 cream as needed at bite  site  Depression Patient with PHQ-9 of 14 presenting to discuss refilling her medications. She states that while she knows that her prior dose of Lexapro is the culprit for her night sweats, it managed her depression, and since coming down the dose to 10 mg, she feels that her depression has worsened. She would like to switch back to her prior dose of Lexapro and discontinue the Wellbutrin.  - Will increase Lexapro to 30 mg daily - Discontinue Wellbutrin - Follow up in 6 weeks - PHQ-9 at next visit

## 2021-07-18 NOTE — Patient Instructions (Signed)
To Ms. Agresti,   It was a pleasure meeting you again! Today we discussed your night sweats, medication refills, and tick bite.   For your night sweats, I am glad that we have found the culprit, the Lexapro, and saved ourselves a scan.   For the medication refills, we will restart your Lexapro 30 mg back, knowing that the side effect is night sweats, I would like to start this medication back slowly. For this week please take 2 tablets of your 10 mg for a total dose of 20 mg. Next week please take 3 tablets (30 mg) until you see [place] back in 6 weeks for reevaluation. We will also stop the Wellbutrin.   For your tick bite, I am not seeing signs of retained tick parts, infection, or rashes concerning for Lyme disease. Please take over the counter Zyrtec for the itchiness, and over the counter Cortizone 10 for relief.   We will see you back in 6 weeks! Have a good day,  Dolan Amen, MD

## 2021-07-19 DIAGNOSIS — W57XXXA Bitten or stung by nonvenomous insect and other nonvenomous arthropods, initial encounter: Secondary | ICD-10-CM | POA: Insufficient documentation

## 2021-07-19 NOTE — Assessment & Plan Note (Signed)
Patient with PHQ-9 of 14 presenting to discuss refilling her medications. She states that while she knows that her prior dose of Lexapro is the culprit for her night sweats, it managed her depression, and since coming down the dose to 10 mg, she feels that her depression has worsened. She would like to switch back to her prior dose of Lexapro and discontinue the Wellbutrin.  - Will increase Lexapro to 30 mg daily - Discontinue Wellbutrin - Follow up in 6 weeks - PHQ-9 at next visit

## 2021-07-19 NOTE — Assessment & Plan Note (Signed)
Patient presents to the clinic for evaluation of a tick bite she had Sunday evening. She states that on Sunday evening, while in bed, she noticed intense itching on her right hip and felt a tick. She was able to pluck the tick, but cannot recall what it looks like. She is concerned that there may be retained parts at the site. She is unsure how long the tick was there [place] ticks have been bad this year. She denies any fevers, rashes, or fatigue. She does describe pruritis at the site of the bite. She has not tried anything for relief.  A/P:   Patient presents today for evaluation of a tick bite. On evaluation there are no retained tick parts at the site, there is a local induration, but no sign on erythema, rash, purulence or drainage. Patient was unsure of timing of tick bite, identification of the tick, or timing of the bite. She is outside the 72 hours for prophylaxis. Given lack of rash, low concern for tick bourne illness. Will treat conservatively.  - Zyrtec as needed for pruritis - Cortizone 10 cream as needed at bite site

## 2021-07-19 NOTE — Assessment & Plan Note (Addendum)
Patient with extensive workup, barring CT Chest and Abdomen as this was denied x3, presents today for follow up. Mary Meyer states that after decreasing her dose of Lexapro to 10 mg, she has since stopped having night sweats. She states that while it is nice to know the underlying cause of her night sweats she would rather continue her prior dose of Lexapro as this has managed her depression.  - Will continue Lexapro despite side effect profile, patient voices understanding of known side effect

## 2021-07-27 NOTE — Progress Notes (Signed)
Internal Medicine Clinic Attending  Case discussed with the resident at the time of the visit.  We reviewed the resident's history and exam and pertinent patient test results.  I agree with the assessment, diagnosis, and plan of care documented in the resident's note.  

## 2021-08-19 ENCOUNTER — Encounter: Payer: Self-pay | Admitting: *Deleted

## 2021-08-24 ENCOUNTER — Ambulatory Visit (INDEPENDENT_AMBULATORY_CARE_PROVIDER_SITE_OTHER): Payer: Self-pay | Admitting: Student

## 2021-08-24 DIAGNOSIS — F329 Major depressive disorder, single episode, unspecified: Secondary | ICD-10-CM

## 2021-08-24 NOTE — Progress Notes (Deleted)
   CC: ***  This is a telephone encounter between Mary Meyer and Steffanie Rainwater on 08/24/2021 for ***. The visit was conducted with the patient located at {NAMES:[id number]::"home"} and Steffanie Rainwater at {NAMES:3044014::"[facility]","[facility]","Home"}. The patient's identity was confirmed using their DOB and current address. The {WHO:3044014::"patient","his/her legal guardian","***"} has consented to being evaluated through a telephone encounter and understands the associated risks (an examination cannot be done and the patient may need to come in for an appointment) / benefits (allows the patient to remain at home, decreasing exposure to coronavirus). I personally spent {Numbers; 0-31:[id number]} minutes on medical discussion.   HPI:  [name] is a 31 y.o. with PMH as below.   Please see A&P for assessment of the patient's acute and chronic medical conditions.   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  Pt states she had a stent placed during the cath/ablation, but records don't reflect this.  Cards appt 04/26/14 @ [facility]: EKG & Holter monitor all returned normal per pt, no further f/u needed per cards    Urinary tract infection without hematuria 07/10/2019   Vaginal Pap smear, abnormal    Review of Systems:  ***    Assessment & Plan:   See Encounters Tab for problem based charting.  Patient {GC/GE:3044014::"discussed with","seen with"} Dr. {NAMES:3044014::"Butcher","Granfortuna","E. Hoffman","Klima","Mullen","Narendra","Raines","Vincent"}  Sharrell Ku, MD, MPH

## 2021-08-26 ENCOUNTER — Encounter: Payer: Self-pay | Admitting: Student

## 2021-08-26 NOTE — Progress Notes (Signed)
Unable to reach patient via telephone after 3 attempts total in the morning and in the afternoon.

## 2021-08-30 ENCOUNTER — Ambulatory Visit
Admission: RE | Admit: 2021-08-30 | Discharge: 2021-08-30 | Disposition: A | Discharge status: Home / Self Care | Payer: Medicaid Other | Source: Ambulatory Visit | Attending: Family Medicine | Admitting: Family Medicine

## 2021-08-30 VITALS — BP 130/81 | HR 80 | Temp 98.5°F | Resp 16

## 2021-08-30 DIAGNOSIS — J069 Acute upper respiratory infection, unspecified: Secondary | ICD-10-CM | POA: Diagnosis not present

## 2021-08-30 LAB — POCT RAPID STREP A (OFFICE): Rapid Strep A Screen: NEGATIVE

## 2021-08-30 MED ORDER — PROMETHAZINE-DM 6.25-15 MG/5ML PO SYRP: 5.0000 mL | ORAL_SOLUTION | Freq: Four times a day (QID) | ORAL | 0 refills | Status: DC | PRN

## 2021-08-30 NOTE — ED Triage Notes (Signed)
Pt reports cough, chills, sore throat, bilateral ear pain, no taste or smell x 3 days. Mucinex gives no relief.

## 2021-08-30 NOTE — ED Provider Notes (Signed)
[facility]    CSN: [id number] Arrival date & time: 08/30/21  1652      History   Chief Complaint Chief Complaint  Patient presents with   Cough    Entered by patient   Appointment    1700   Otalgia         HPI Mary Meyer is a 31 y.o. female.   Presenting today with 3-day history of cough, chills, sore throat, bilateral ear pain and pressure, fatigue.  Denies chest pain, shortness of breath, abdominal pain, nausea vomiting or diarrhea.  Mucinex so far not providing any benefit.  No known sick contacts recently.  No known pertinent chronic medical problems per patient.    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  Pt states she had a stent placed during the cath/ablation, but records don't reflect this.  Cards appt 04/26/14 @ [facility]: EKG & Holter monitor all returned normal per pt, no further f/u needed per cards    Urinary tract infection without hematuria 07/10/2019   Vaginal Pap smear, abnormal     Patient Active Problem List   Diagnosis Date Noted   Tick bite 07/19/2021   Sinusitis 02/03/2021   Chronic night sweats in the setting of SSRI 02/03/2021   Back pain 08/19/2020   Papular urticaria 07/18/2020   Seborrheic dermatitis 07/18/2020   Alcohol use disorder, mild, abuse 07/18/2020   Tobacco use 05/02/2020   Depression 04/24/2019   Urinary incontinence in female 03/22/2017   Anxiety 12/15/2015   Abnormal Pap smear of cervix 05/07/2014   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 Authorizing Provider  promethazine-dextromethorphan (PROMETHAZINE-DM) 6.25-15 MG/5ML syrup Take 5 mLs by mouth 4 (four) times daily as needed. 08/30/21  Yes Particia Nearing, PA-C  escitalopram (LEXAPRO) 10 MG tablet Take 1 tablet (10 mg total) by mouth daily. 07/18/21 07/13/22  Dolan Amen, MD  fluticasone (FLONASE) 50 MCG/ACT nasal spray Place 2 sprays into both nostrils daily. 02/02/21 03/04/21  Dellia Cloud, MD  mirabegron ER (MYRBETRIQ) 25 MG TB24 tablet Take 1 tablet (25 mg total) by mouth daily. 11/29/20   Cheral Marker, CNM  norethindrone-ethinyl estradiol-FE Elmarie Shiley FE 1/20) 1-20 MG-MCG tablet Take 1 tablet by  mouth daily. 11/29/20   Cheral Marker, CNM    Family History Family History  Problem Relation Age of Onset   Thyroid disease Maternal Aunt    Heart Problems Maternal Aunt    Cancer Maternal Grandmother    Other Maternal Grandmother        brain tumor   Cancer Maternal Grandfather     Social History Social History   Tobacco Use   Smoking status: Former    Packs/day: 0.00    Years: 10.00    Total pack years: 0.00    Types: Cigarettes    Passive exposure: Never   Smokeless tobacco: Never   Tobacco comments:    smokes 4-5  cig daily  Vaping Use   Vaping Use: Every day   Substances: Nicotine, Flavoring  Substance Use Topics   Alcohol use: Yes    Alcohol/week: 0.0 standard drinks of alcohol    Comment: occ    Drug use: No     Allergies   Patient has no known  allergies.   Review of Systems Review of Systems Per HPI  Physical Exam Triage Vital Signs ED Triage Vitals [08/30/21 1659]  Enc Vitals Group     BP 130/81     Pulse Rate 80     Resp 16     Temp 98.5 F (36.9 C)     Temp Source Oral     SpO2 98 %     Weight      Height      Head Circumference      Peak Flow      Pain Score 6     Pain Loc      Pain Edu?      Excl. in GC?    No data found.  Updated Vital Signs BP 130/81 (BP Location: Right Arm)   Pulse 80   Temp 98.5 F (36.9 C) (Oral)   Resp 16   SpO2 98%   Visual Acuity Right Eye Distance:   Left Eye Distance:   Bilateral Distance:    Right Eye Near:   Left Eye Near:    Bilateral Near:     Physical Exam Vitals and nursing note reviewed.  Constitutional:      Appearance: Normal appearance. She is not ill-appearing.  HENT:     Head: Atraumatic.     Right Ear: Tympanic membrane normal.     Left Ear: Tympanic membrane normal.     Nose: Rhinorrhea present.     Mouth/Throat:     Mouth: Mucous membranes are moist.     Pharynx: Posterior oropharyngeal erythema present. No oropharyngeal exudate.  Eyes:     Extraocular Movements: Extraocular movements intact.     Conjunctiva/sclera: Conjunctivae normal.  Cardiovascular:     Rate and Rhythm: Normal rate and regular rhythm.     Heart sounds: Normal heart sounds.  Pulmonary:     Effort: Pulmonary effort is normal.     Breath sounds: Normal breath sounds. No wheezing or rales.  Musculoskeletal:        General: Normal range of motion.     Cervical back: Normal range of motion and neck supple.  Lymphadenopathy:     Cervical: No cervical adenopathy.  Skin:    General: Skin is warm and dry.  Neurological:     Mental Status: She is alert and oriented to person, place, and time.  Psychiatric:        Mood and Affect: Mood normal.  Thought Content: Thought content normal.        Judgment: Judgment normal.    UC Treatments / Results  Labs (all labs  ordered are listed, but only abnormal results are displayed) Labs Reviewed  NOVEL CORONAVIRUS, NAA  POCT RAPID STREP A (OFFICE)    EKG   Radiology No results found.  Procedures Procedures (including critical care time)  Medications Ordered in UC Medications - No data to display  Initial Impression / Assessment and Plan / UC Course  I have reviewed the triage vital signs and the nursing notes.  Pertinent labs & imaging results that were available during my care of the patient were reviewed by me and considered in my medical decision making (see chart for details).     Vitals and exam overall reassuring and suspicious for a viral upper respiratory infection.  Rapid strep was negative, COVID PCR pending.  Discussed Phenergan DM, supportive over-the-counter medications and home care.  Work note given.  Return for any worsening symptoms.  Final Clinical Impressions(s) / UC Diagnoses   Final diagnoses:  Viral URI with cough   Discharge Instructions   None    ED Prescriptions     Medication Sig Dispense Auth. Provider   promethazine-dextromethorphan (PROMETHAZINE-DM) 6.25-15 MG/5ML syrup Take 5 mLs by mouth 4 (four) times daily as needed. 100 mL Particia Nearing, [place]      PDMP not reviewed this encounter.   Particia Nearing, [place] 08/30/21 1835

## 2021-08-31 LAB — NOVEL CORONAVIRUS, NAA: SARS-CoV-2, NAA: NOT DETECTED

## 2021-09-05 ENCOUNTER — Encounter: Payer: Self-pay | Admitting: Adult Health

## 2021-09-05 ENCOUNTER — Ambulatory Visit: Payer: Medicaid Other | Admitting: Adult Health

## 2021-09-05 VITALS — BP 130/86 | HR 75 | Ht 63.0 in | Wt 140.0 lb

## 2021-09-05 DIAGNOSIS — N760 Acute vaginitis: Secondary | ICD-10-CM | POA: Insufficient documentation

## 2021-09-05 DIAGNOSIS — R6882 Decreased libido: Secondary | ICD-10-CM | POA: Insufficient documentation

## 2021-09-05 DIAGNOSIS — B9689 Other specified bacterial agents as the cause of diseases classified elsewhere: Secondary | ICD-10-CM | POA: Diagnosis not present

## 2021-09-05 DIAGNOSIS — N898 Other specified noninflammatory disorders of vagina: Secondary | ICD-10-CM | POA: Diagnosis not present

## 2021-09-05 DIAGNOSIS — N920 Excessive and frequent menstruation with regular cycle: Secondary | ICD-10-CM | POA: Diagnosis not present

## 2021-09-05 LAB — POCT WET PREP (WET MOUNT)
Clue Cells Wet Prep Whiff POC: NEGATIVE
WBC, Wet Prep HPF POC: POSITIVE

## 2021-09-05 MED ORDER — FLUCONAZOLE 150 MG PO TABS: ORAL_TABLET | ORAL | 1 refills | Status: DC

## 2021-09-05 MED ORDER — METRONIDAZOLE 500 MG PO TABS: 500.0000 mg | ORAL_TABLET | Freq: Two times a day (BID) | ORAL | 0 refills | Status: DC

## 2021-09-05 NOTE — Progress Notes (Signed)
  Subjective:     Patient ID: Mary Meyer, female   DOB: 1990/04/29, 31 y.o.   MRN: [id number]  HPI Mary Meyer is a 31 year old white female,single, G3P1021 in complaining of vaginal discharge with itching and burning and spotting and decreased sex drive.  Lab Results  Component Value Date   DIAGPAP  03/10/2019    - Negative for intraepithelial lesion or malignancy (NILM)   HPV DETECTED (A) 12/15/2015   HPVHIGH Negative 03/10/2019   PCP is Dr Aundria Rud   Review of Systems +vaginal discharge +vaginal itching and burning +decreased sex drive +spotting when wipes at times, no missed pills  Denies any new partners, same for 13 years   Reviewed past medical,surgical, social and family history. Reviewed medications and allergies.     Objective:   Physical Exam BP 130/86 (BP Location: Left Arm, Patient Position: Sitting, Cuff Size: Normal)   Pulse 75   Ht 5\' 3"  (1.6 m)   Wt 140 lb (63.5 kg)   LMP 08/17/2021 (Approximate)   BMI 24.80 kg/m     Skin warm and dry.Pelvic: external genitalia is normal in appearance no lesions, vagina: white discharge without odor,urethra has no lesions or masses noted, cervix:smooth and bulbous, uterus: normal size, shape and contour, non tender, no masses felt, adnexa: no masses or tenderness noted. Bladder is non tender and no masses felt. Wet prep: + for clue cells and +WBCs Fall risk is low  Upstream - 09/05/21 0909       Pregnancy Intention Screening   Does the patient want to become pregnant in the next year? No    Does the patient's partner want to become pregnant in the next year? No    Would the patient like to discuss contraceptive options today? No      Contraception Wrap Up   Current Method Oral Contraceptive    End Method Oral Contraceptive            Examination chaperoned by 0910 LPN  Assessment:     1. Vaginal discharge  2. Vaginal itching  3. BV (bacterial vaginosis) +clue cells on wet prep Will rx flagyl, no  sex or alcohol while taking, will rx diflucan too  Meds ordered this encounter  Medications   metroNIDAZOLE (FLAGYL) 500 MG tablet    Sig: Take 1 tablet (500 mg total) by mouth 2 (two) times daily.    Dispense:  14 tablet    Refill:  0    Order Specific Question:   Supervising Provider    Answer:   Malachy Mood, LUTHER H [[id number]]   fluconazole (DIFLUCAN) 150 MG tablet    Sig: Take 1 now and 1 in 3 days    Dispense:  2 tablet    Refill:  1    Order Specific Question:   Supervising Provider    Answer:   EURE, LUTHER H [[id number]]     4. Decreased sex drive [employer] ADDYI Try to increase frequency of sex   5. Spotting Will watch for now, may be from BV    Plan:     Follow up prn

## 2022-02-26 DIAGNOSIS — O009 Unspecified ectopic pregnancy without intrauterine pregnancy: Secondary | ICD-10-CM

## 2022-02-26 HISTORY — DX: Unspecified ectopic pregnancy without intrauterine pregnancy: O00.90

## 2022-02-28 ENCOUNTER — Telehealth: Payer: Self-pay

## 2022-02-28 MED ORDER — NORETHIN ACE-ETH ESTRAD-FE 1-20 MG-MCG PO TABS: 1.0000 | ORAL_TABLET | Freq: Every day | ORAL | 3 refills | Status: DC

## 2022-02-28 NOTE — Telephone Encounter (Signed)
Refilled larin Fe

## 2022-02-28 NOTE — Telephone Encounter (Signed)
Left message on voicemail notifying patient that med has been refilled.

## 2022-02-28 NOTE — Telephone Encounter (Signed)
Patient wants to know if she can get a refill called in for birth control, until she comes in for her annual.

## 2022-03-08 ENCOUNTER — Encounter: Payer: Self-pay | Admitting: Adult Health

## 2022-03-08 ENCOUNTER — Ambulatory Visit (INDEPENDENT_AMBULATORY_CARE_PROVIDER_SITE_OTHER): Payer: 59 | Admitting: Adult Health

## 2022-03-08 VITALS — BP 134/88 | HR 97 | Ht 63.0 in | Wt 134.0 lb

## 2022-03-08 DIAGNOSIS — Z3201 Encounter for pregnancy test, result positive: Secondary | ICD-10-CM | POA: Diagnosis not present

## 2022-03-08 DIAGNOSIS — N939 Abnormal uterine and vaginal bleeding, unspecified: Secondary | ICD-10-CM | POA: Insufficient documentation

## 2022-03-08 LAB — POCT URINE PREGNANCY: Preg Test, Ur: POSITIVE — AB

## 2022-03-08 MED ORDER — PRENATAL PLUS 27-1 MG PO TABS: 1.0000 | ORAL_TABLET | Freq: Every day | ORAL | 12 refills | Status: DC

## 2022-03-08 NOTE — Progress Notes (Signed)
  Subjective:     Patient ID: Mary Meyer, female   DOB: 1990/06/06, 32 y.o.   MRN: [id number]  HPI Mary Meyer is a 32 year old white female,single, G4P1021 in for UPT, had bleeding earlier today but none now, had +UPT at Urgent Care Saturday, seen for ear infection on amoxil.  Last pap was negative HPV and NILM 03/10/19.  PCP is Dr Stann Mainland  Review of Systems LMP October ran out of birth control +UPT Saturday  Bleeding today, but none now Reviewed past medical,surgical, social and family history. Reviewed medications and allergies.     Objective:   Physical Exam BP 134/88 (BP Location: Left Arm, Patient Position: Sitting, Cuff Size: Normal)   Pulse 97   Ht 5\' 3"  (1.6 m)   Wt 134 lb (60.8 kg)   LMP 12/17/2021 (Approximate)   BMI 23.74 kg/m  UPT is faintly + Skin warm and dry. Lungs: clear to ausculation bilaterally. Cardiovascular: regular rate and rhythm.    Fall risk is low  Upstream - 03/08/22 1457       Pregnancy Intention Screening   Does the patient want to become pregnant in the next year? N/A    Does the patient's partner want to become pregnant in the next year? N/A    Would the patient like to discuss contraceptive options today? N/A      Contraception Wrap Up   Current Method Pregnant/Seeking Pregnancy    End Method Pregnant/Seeking Pregnancy    Contraception Counseling Provided No             Assessment:     1. Pregnancy examination or test, positive result Had +UPT Saturday at Urgent Care Check [facility] now Take PNV,rx sent  Meds ordered this encounter  Medications   prenatal vitamin w/FE, FA (PRENATAL 1 + 1) 27-1 MG TABS tablet    Sig: Take 1 tablet by mouth daily at 12 noon.    Dispense:  30 tablet    Refill:  12    Order Specific Question:   Supervising Provider    Answer:   Tania Ade H [[id number]]     2. Vaginal bleeding Had bleeding earlier today, none now Will check QHCG now, will repeat in 48+ hours to see if rising or not  Blood type  is O+ in EMR    Plan:     Follow up TBD

## 2022-03-09 ENCOUNTER — Other Ambulatory Visit: Payer: Self-pay | Admitting: Adult Health

## 2022-03-09 DIAGNOSIS — Z3201 Encounter for pregnancy test, result positive: Secondary | ICD-10-CM

## 2022-03-09 DIAGNOSIS — N939 Abnormal uterine and vaginal bleeding, unspecified: Secondary | ICD-10-CM

## 2022-03-09 LAB — BETA HCG QUANT (REF LAB): hCG Quant: 79 m[IU]/mL

## 2022-03-13 ENCOUNTER — Other Ambulatory Visit: Payer: Self-pay | Admitting: Adult Health

## 2022-03-13 DIAGNOSIS — Z3201 Encounter for pregnancy test, result positive: Secondary | ICD-10-CM

## 2022-03-13 DIAGNOSIS — N939 Abnormal uterine and vaginal bleeding, unspecified: Secondary | ICD-10-CM

## 2022-03-13 LAB — BETA HCG QUANT (REF LAB): hCG Quant: 755 m[IU]/mL

## 2022-03-13 NOTE — Progress Notes (Signed)
Ck QHCG in am  

## 2022-03-15 ENCOUNTER — Inpatient Hospital Stay (HOSPITAL_COMMUNITY)
Admission: AD | Admit: 2022-03-15 | Discharge: 2022-03-15 | Disposition: A | Discharge status: Home / Self Care | Payer: 59 | Attending: Obstetrics & Gynecology | Admitting: Obstetrics & Gynecology

## 2022-03-15 ENCOUNTER — Other Ambulatory Visit: Payer: Self-pay

## 2022-03-15 ENCOUNTER — Other Ambulatory Visit: Payer: Self-pay | Admitting: Adult Health

## 2022-03-15 ENCOUNTER — Encounter (HOSPITAL_COMMUNITY): Payer: Self-pay | Admitting: Obstetrics & Gynecology

## 2022-03-15 ENCOUNTER — Inpatient Hospital Stay (HOSPITAL_COMMUNITY): Payer: 59

## 2022-03-15 DIAGNOSIS — O0281 Inappropriate change in quantitative human chorionic gonadotropin (hCG) in early pregnancy: Secondary | ICD-10-CM

## 2022-03-15 DIAGNOSIS — O209 Hemorrhage in early pregnancy, unspecified: Secondary | ICD-10-CM | POA: Diagnosis not present

## 2022-03-15 DIAGNOSIS — O200 Threatened abortion: Secondary | ICD-10-CM | POA: Diagnosis not present

## 2022-03-15 DIAGNOSIS — O3680X0 Pregnancy with inconclusive fetal viability, not applicable or unspecified: Secondary | ICD-10-CM | POA: Diagnosis not present

## 2022-03-15 DIAGNOSIS — Z3A00 Weeks of gestation of pregnancy not specified: Secondary | ICD-10-CM | POA: Diagnosis not present

## 2022-03-15 DIAGNOSIS — Z3201 Encounter for pregnancy test, result positive: Secondary | ICD-10-CM

## 2022-03-15 DIAGNOSIS — N939 Abnormal uterine and vaginal bleeding, unspecified: Secondary | ICD-10-CM

## 2022-03-15 LAB — URINALYSIS, ROUTINE W REFLEX MICROSCOPIC
Bilirubin Urine: NEGATIVE
Glucose, UA: NEGATIVE mg/dL
Ketones, ur: NEGATIVE mg/dL
Nitrite: NEGATIVE
Protein, ur: NEGATIVE mg/dL
Specific Gravity, Urine: 1.003 — ABNORMAL LOW (ref 1.005–1.030)
pH: 6 (ref 5.0–8.0)

## 2022-03-15 LAB — CBC
HCT: 41.6 % (ref 36.0–46.0)
Hemoglobin: 14.5 g/dL (ref 12.0–15.0)
MCH: 31.4 pg (ref 26.0–34.0)
MCHC: 34.9 g/dL (ref 30.0–36.0)
MCV: 90 fL (ref 80.0–100.0)
Platelets: 252 10*3/uL (ref 150–400)
RBC: 4.62 MIL/uL (ref 3.87–5.11)
RDW: 11.7 % (ref 11.5–15.5)
WBC: 6.3 10*3/uL (ref 4.0–10.5)
nRBC: 0 % (ref 0.0–0.2)

## 2022-03-15 LAB — WET PREP, GENITAL
Clue Cells Wet Prep HPF POC: NONE SEEN
Sperm: NONE SEEN
Trich, Wet Prep: NONE SEEN
WBC, Wet Prep HPF POC: <10 (ref <10)
Yeast Wet Prep HPF POC: NONE SEEN

## 2022-03-15 LAB — BETA HCG QUANT (REF LAB): hCG Quant: 824 m[IU]/mL

## 2022-03-15 LAB — ABO/RH: ABO/RH(D): O POS

## 2022-03-15 LAB — HCG, QUANTITATIVE, PREGNANCY: hCG, Beta Chain, Quant, S: 1086 m[IU]/mL — ABNORMAL HIGH (ref <5)

## 2022-03-15 NOTE — MAU Note (Addendum)
Mary Meyer is a 32 y.o. at Unknown here in MAU reporting: she's having VB that began 1 week ago.  Reports VB isn't heavy enough to wear a sanitary napkin.  Isn't wearing a pad or panty liner.  Last intercourse over 1 week ago, none since VB began.  Also reports lower abdominal cramping mostly in left side. LMP: November 2023 (beginning) Onset of complaint: 1 week ago Pain score: 3 Vitals:   03/15/22 1226  BP: 121/73  Pulse: 86  Resp: 19  Temp: 98.4 F (36.9 C)  SpO2: 100%     FHT:NA Lab orders placed from triage:   UA

## 2022-03-15 NOTE — MAU Provider Note (Signed)
History     CSN: [id number]  Arrival date and time: 03/15/22 1149   Event Date/Time   First Provider Initiated Contact with Patient 03/15/22 1302      Chief Complaint  Patient presents with   Vaginal Bleeding   Mary Meyer , a  32 y.o. [id number] at Unknown presents to MAU with complaints of vaginal bleeding x1 week. Patient states she has a Positive UPT on Saturday. She began bleeding on on Sunday. Reports small just with wiping but continued to get heavier over the last 2 days. She endorses bright red bleeding and passing  clots. She reports more brown vaginal discharge starting today and denies passing clots. She also reports pain on the left side. Currently rating pain 3/10. Denies attempting to relieve symptoms.   She saw her PCP this week and has had a steady rise over the course of the last 7 day despite the bleeding.          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  Pt states she had a stent placed during the cath/ablation, but records don't reflect this.  Cards appt 04/26/14 @ [facility]: EKG & Holter monitor all returned normal per pt, no further f/u needed per cards    Urinary tract infection without hematuria 07/10/2019   Vaginal Pap smear, abnormal     Past Surgical History:  Procedure Laterality Date   CARDIAC CATHETERIZATION  2009   states due to tacycardia, had alblation   CARDIAC CATHETERIZATION  2009   CESAREAN SECTION N/A 10/03/2014   Procedure:  CESAREAN SECTION;  Surgeon: Adam Phenix, MD;  Location: [facility];  Service: Obstetrics;  Laterality: N/A;    Family History  Problem Relation Age of Onset   Thyroid disease Maternal Aunt    Heart Problems Maternal Aunt    Cancer Maternal Grandmother    Other Maternal Grandmother        brain tumor   Cancer Maternal Grandfather     Social History   Tobacco Use   Smoking status: Former    Packs/day: 0.00    Years: 10.00    Total pack years: 0.00    Types: Cigarettes    Passive exposure: Never   Smokeless tobacco: Never   Tobacco comments:    smokes 4-5  cig daily  Vaping Use   Vaping Use: Former   Substances: Nicotine, Flavoring  Substance Use Topics   Alcohol use: Not Currently    Comment: occ    Drug use: No    Allergies: No Known Allergies  Medications Prior to Admission  Medication Sig Dispense Refill Last Dose   amoxicillin (AMOXIL) 500 MG capsule Take 500 mg by mouth  2 (two) times daily.      prenatal vitamin w/FE, FA (PRENATAL 1 + 1) 27-1 MG TABS tablet Take 1 tablet by mouth daily at 12 noon. 30 tablet 12     Review of Systems  Constitutional:  Negative for chills, fatigue and fever.  Eyes:  Negative for pain and visual disturbance.  Respiratory:  Negative for apnea, shortness of breath and wheezing.   Cardiovascular:  Negative for chest pain and palpitations.  Gastrointestinal:  Positive for abdominal pain. Negative for constipation, diarrhea, nausea and vomiting.  Genitourinary:  Positive for vaginal bleeding. Negative for difficulty urinating, dysuria, pelvic pain, vaginal discharge and vaginal pain.  Musculoskeletal:  Negative for back pain.  Neurological:  Negative for seizures, weakness and headaches.  Psychiatric/Behavioral:  Negative for suicidal ideas.    Physical Exam   Blood pressure 121/73, pulse 86, temperature 98.4 F (36.9 C), temperature source Oral, resp. rate 19, height 5\' 3"  (1.6 m), weight 59.4 kg, last menstrual period 12/17/2021,  SpO2 100 %.  Physical Exam Vitals and nursing note reviewed.  Constitutional:      General: She is not in acute distress.    Appearance: Normal appearance.  HENT:     Head: Normocephalic.  Pulmonary:     Effort: Pulmonary effort is normal.  Musculoskeletal:     Cervical back: Normal range of motion.  Skin:    General: Skin is warm and dry.     Capillary Refill: Capillary refill takes less than 2 seconds.  Neurological:     Mental Status: She is alert and oriented to person, place, and time.  Psychiatric:        Mood and Affect: Mood normal.    MAU Course  Procedures Orders Placed This Encounter  Procedures   Wet prep, genital   12/19/2021 OB LESS THAN 14 WEEKS WITH OB TRANSVAGINAL   Urinalysis, Routine w reflex microscopic Urine, Clean Catch   CBC   hCG, quantitative, pregnancy   Diet NPO time specified   ABO/Rh   Results for orders placed or performed during the hospital encounter of 03/15/22 (from the past 24 hour(s))  CBC     Status: None   Collection Time: 03/15/22 12:53 PM  Result Value Ref Range   WBC 6.3 4.0 - 10.5 K/uL   RBC 4.62 3.87 - 5.11 MIL/uL   Hemoglobin 14.5 12.0 - 15.0 g/dL   HCT 03/17/22 40.9 - 81.1 %   MCV 90.0 80.0 - 100.0 fL   MCH 31.4 26.0 - 34.0 pg   MCHC 34.9 30.0 - 36.0 g/dL   RDW 91.4 78.2 - 95.6 %   Platelets 252 150 - 400 K/uL   nRBC 0.0 0.0 - 0.2 %  ABO/Rh     Status: None   Collection Time: 03/15/22 12:53 PM  Result Value Ref Range   ABO/RH(D) O POS    No rh immune globuloin      NOT A RH IMMUNE GLOBULIN CANDIDATE, PT RH POSITIVE Performed at [facility], [address] [place], [place]   hCG, quantitative, pregnancy     Status: Abnormal   Collection Time: 03/15/22 12:53 PM  Result Value Ref Range   hCG, Beta Chain, Quant, S 1,086 (H) <5 mIU/mL  Urinalysis, Routine w reflex microscopic Urine, Clean Catch     Status: Abnormal   Collection Time: 03/15/22 12:59 PM  Result Value Ref Range   Color, Urine YELLOW YELLOW    APPearance HAZY (A) CLEAR   Specific  Gravity, Urine 1.003 (L) 1.005 - 1.030   pH 6.0 5.0 - 8.0   Glucose, UA NEGATIVE NEGATIVE mg/dL   Hgb urine dipstick LARGE (A) NEGATIVE   Bilirubin Urine NEGATIVE NEGATIVE   Ketones, ur NEGATIVE NEGATIVE mg/dL   Protein, ur NEGATIVE NEGATIVE mg/dL   Nitrite NEGATIVE NEGATIVE   Leukocytes,Ua TRACE (A) NEGATIVE   RBC / HPF 0-5 0 - 5 RBC/hpf   WBC, UA 6-10 0 - 5 WBC/hpf   Bacteria, UA RARE (A) NONE SEEN   Squamous Epithelial / HPF 21-50 0 - 5 /HPF   Mucus PRESENT   Wet prep, genital     Status: None   Collection Time: 03/15/22  1:04 PM   Specimen: PATH Cytology Cervicovaginal Ancillary Only  Result Value Ref Range   Yeast Wet Prep HPF POC NONE SEEN NONE SEEN   Trich, Wet Prep NONE SEEN NONE SEEN   Clue Cells Wet Prep HPF POC NONE SEEN NONE SEEN   WBC, Wet Prep HPF POC <10 <10   Sperm NONE SEEN              Component Ref Range & Units 1 d ago 3 d ago 7 d ago 1 yr ago 2 yr ago 3 yr ago 5 yr ago  hCG Quant mIU/mL 824 755 CM 79 CM         US OB LESS THAN 14 WEEKS WITH OB TRANSVAGINAL  Result Date: 03/15/2022 CLINICAL DATA:  Abdominal pain, vaginal bleeding, beta HCG 1,086 EXAM: OBSTETRIC <14 WK [place] AND TRANSVAGINAL OB US TECHNIQUE: Both transabdominal and transvaginal ultrasound examinations were performed for complete evaluation of the gestation as well as the maternal uterus, adnexal regions, and pelvic cul-de-sac. Transvaginal technique was performed to assess early pregnancy. COMPARISON:  None Available. FINDINGS: Intrauterine gestational sac: None Yolk sac:  Not Visualized. Embryo:  Not Visualized. Cardiac Activity: Not Visualized. Maternal uterus/adnexae: Uterus is grossly unremarkable. There is a small amount of fluid within the endometrial cavity. The right ovary measures 2.3 x 1.2 x 1.2 cm. No evidence of ovarian mass. The left ovary measures 3.3 x 1.7 x 2.1 cm. 1.3 cm thick walled cystic structure within the left adnexa is nonspecific. I do not  see any yolk sac or fetal pole within the cystic structure. No significant increased vascularity. There is trace pelvic free fluid. IMPRESSION: 1. No evidence of intrauterine pregnancy at this time. 2. 1.3 cm thick walled cystic structure within the left adnexa, nonspecific. Given the patient complaint of left adnexal pain, left adnexal ectopic pregnancy cannot be excluded. 3. Trace pelvic free fluid. No evidence of hemorrhage to suggest ruptured ectopic pregnancy. Critical Value/emergent results were called by telephone at the time of interpretation on 03/15/2022 at 3:23 pm to provider [facility] , who verbally acknowledged these results. Electronically Signed   By: Randa Ngo M.D.   On: 03/15/2022 15:23    MDM - CBC normal  - Normal Wet Prep  - UA reflexed to Culture  - Quant increased from 824--> 1086 in less than 48 hours.  - [place] results suggestive of ectopic but not definitive. Consulted Dr. Ernestina Patches, and reviewed patient presentation labs and current clinical picture. MD reviewed images and per MD recommendation repeat quant tomorrow on 1/19 and compare results to 1/17. Send home on strict return Ectopic precautions.   Assessment and Plan   1. Pregnancy with uncertain fetal viability, single or unspecified fetus   2. Abortion, threatened, early pregnancy   3. Elevated level  of quantitative hCG for gestational age in early pregnancy    - Patient has labs ordered for tomorrow morning, by OB. Encouraged to keep visit.  - Strict ectopic precautions reviewed.  - Worsening signs and return precautions reviewed.  - Reccommended to follow up with OB provider  - Patient discharged home in stable condition and may return to MAU.   Jacquiline Doe, MSN CNM  03/15/2022, 1:02 PM

## 2022-03-16 LAB — GC/CHLAMYDIA PROBE AMP (~~LOC~~) NOT AT ARMC
Chlamydia: NEGATIVE
Comment: NEGATIVE
Comment: NORMAL
Neisseria Gonorrhea: NEGATIVE

## 2022-03-17 LAB — BETA HCG QUANT (REF LAB): hCG Quant: 1106 m[IU]/mL

## 2022-03-18 ENCOUNTER — Inpatient Hospital Stay (HOSPITAL_COMMUNITY): Payer: 59

## 2022-03-18 ENCOUNTER — Other Ambulatory Visit: Payer: Self-pay

## 2022-03-18 ENCOUNTER — Inpatient Hospital Stay (HOSPITAL_COMMUNITY)
Admission: AD | Admit: 2022-03-18 | Discharge: 2022-03-18 | Disposition: A | Discharge status: Home / Self Care | Payer: 59 | Attending: Family Medicine | Admitting: Family Medicine

## 2022-03-18 DIAGNOSIS — O00102 Left tubal pregnancy without intrauterine pregnancy: Secondary | ICD-10-CM

## 2022-03-18 DIAGNOSIS — O26891 Other specified pregnancy related conditions, first trimester: Secondary | ICD-10-CM

## 2022-03-18 DIAGNOSIS — R109 Unspecified abdominal pain: Secondary | ICD-10-CM | POA: Insufficient documentation

## 2022-03-18 DIAGNOSIS — Z3A13 13 weeks gestation of pregnancy: Secondary | ICD-10-CM | POA: Diagnosis not present

## 2022-03-18 LAB — CBC
HCT: 42.4 % (ref 36.0–46.0)
Hemoglobin: 14.7 g/dL (ref 12.0–15.0)
MCH: 31.4 pg (ref 26.0–34.0)
MCHC: 34.7 g/dL (ref 30.0–36.0)
MCV: 90.6 fL (ref 80.0–100.0)
Platelets: 233 10*3/uL (ref 150–400)
RBC: 4.68 MIL/uL (ref 3.87–5.11)
RDW: 11.6 % (ref 11.5–15.5)
WBC: 5.1 10*3/uL (ref 4.0–10.5)
nRBC: 0 % (ref 0.0–0.2)

## 2022-03-18 LAB — URINALYSIS, ROUTINE W REFLEX MICROSCOPIC
Bilirubin Urine: NEGATIVE
Glucose, UA: NEGATIVE mg/dL
Ketones, ur: NEGATIVE mg/dL
Leukocytes,Ua: NEGATIVE
Nitrite: NEGATIVE
Protein, ur: 30 mg/dL — AB
Specific Gravity, Urine: 1.002 — ABNORMAL LOW (ref 1.005–1.030)
pH: 6 (ref 5.0–8.0)

## 2022-03-18 LAB — COMPREHENSIVE METABOLIC PANEL
ALT: 19 U/L (ref 0–44)
AST: 21 U/L (ref 15–41)
Albumin: 4 g/dL (ref 3.5–5.0)
Alkaline Phosphatase: 48 U/L (ref 38–126)
Anion gap: 7 (ref 5–15)
BUN: 8 mg/dL (ref 6–20)
CO2: 22 mmol/L (ref 22–32)
Calcium: 8.7 mg/dL — ABNORMAL LOW (ref 8.9–10.3)
Chloride: 106 mmol/L (ref 98–111)
Creatinine, Ser: 0.86 mg/dL (ref 0.44–1.00)
GFR, Estimated: >60 mL/min (ref >60)
Glucose, Bld: 79 mg/dL (ref 70–99)
Potassium: 3.8 mmol/L (ref 3.5–5.1)
Sodium: 135 mmol/L (ref 135–145)
Total Bilirubin: <0.1 mg/dL — ABNORMAL LOW (ref 0.3–1.2)
Total Protein: 6.9 g/dL (ref 6.5–8.1)

## 2022-03-18 LAB — HCG, QUANTITATIVE, PREGNANCY: hCG, Beta Chain, Quant, S: 1591 m[IU]/mL — ABNORMAL HIGH (ref <5)

## 2022-03-18 MED ORDER — TRAMADOL HCL 50 MG PO TABS: 50.0000 mg | ORAL_TABLET | Freq: Four times a day (QID) | ORAL | 0 refills | Status: DC | PRN

## 2022-03-18 MED ORDER — METHOTREXATE FOR ECTOPIC PREGNANCY
50.0000 mg/m2 | Freq: Once | INTRAMUSCULAR | Status: AC
  Administered 2022-03-18: 80 mg via INTRAMUSCULAR
  Filled 2022-03-18: qty 3.2

## 2022-03-18 NOTE — MAU Note (Signed)
.  Mary Meyer is a 32 y.o. at Unknown here in MAU reporting: ongoing vag bleeding since 1/11 that has "slightly worsened" today.  Reports lower left abd pain.  Took 800mg  of ibuprofen at noon today with no relief.   Onset of complaint: 1/11 Pain score: 5 Vitals:   03/18/22 1618 03/18/22 1619  BP: (!) 142/88   Pulse: 84   Resp: 18   Temp:  98.3 F (36.8 C)  SpO2:

## 2022-03-18 NOTE — MAU Provider Note (Signed)
History    None     Chief Complaint:  Vaginal Bleeding and Abdominal Pain   Mary Meyer is  32 y.o. [id number] Patient's last menstrual period was 12/17/2021 (approximate).. Patient is here for follow up of quantitative HCG and ongoing surveillance of pregnancy status.   She is 13.[redacted] weeks gestation  by LMP.  Has had abnormal rise in [facility] and vaginal bleeding since 1/11. [place] concerning for left ectopic.   Since her last visit, the patient is with new complaint on increased pain.     ROS Abdominal Pain: Worsened wince last MAU visit 03/16/22. 5/10, steady, unable to sleep through pain now.  Vaginal bleeding: similar to period.   Passage of clots or tissue: Denies Dizziness: Denies  O POS  Her previous Quantitative HCG values are:   Latest Reference Range & Units 03/08/22 15:21 03/12/22 15:54 03/14/22 13:56 03/15/22 12:53 03/16/22 11:41 03/18/22 15:27  hCG Quant mIU/mL 79 755 824  1,106   HCG, Beta Chain, Quant, S <5 mIU/mL    1,086 (H)  1,591 (H)  (H): Data is abnormally high  Last [place] US OB LESS THAN 14 WEEKS WITH OB TRANSVAGINAL  Result Date: 03/15/2022 CLINICAL DATA:  Abdominal pain, vaginal bleeding, beta HCG 1,086 EXAM: OBSTETRIC <14 WK [place] AND TRANSVAGINAL OB US TECHNIQUE: Both transabdominal and transvaginal ultrasound examinations were performed for complete evaluation of the gestation as well as the maternal uterus, adnexal regions, and pelvic cul-de-sac. Transvaginal technique was performed to assess early pregnancy. COMPARISON:  None Available. FINDINGS: Intrauterine gestational sac: None Yolk sac:  Not Visualized. Embryo:  Not Visualized. Cardiac Activity: Not Visualized. Maternal uterus/adnexae: Uterus is grossly unremarkable. There is a small amount of fluid within the endometrial cavity. The right ovary measures 2.3 x 1.2 x 1.2 cm. No evidence of ovarian mass. The left ovary measures 3.3 x 1.7 x 2.1 cm. 1.3 cm thick walled cystic structure within the left adnexa is  nonspecific. I do not see any yolk sac or fetal pole within the cystic structure. No significant increased vascularity. There is trace pelvic free fluid. IMPRESSION: 1. No evidence of intrauterine pregnancy at this time. 2. 1.3 cm thick walled cystic structure within the left adnexa, nonspecific. Given the patient complaint of left adnexal pain, left adnexal ectopic pregnancy cannot be excluded. 3. Trace pelvic free fluid. No evidence of hemorrhage to suggest ruptured ectopic pregnancy. Critical Value/emergent results were called by telephone at the time of interpretation on 03/15/2022 at 3:23 pm to provider [facility] , who verbally acknowledged these results. Electronically Signed   By: Sharlet Salina M.D.   On: 03/15/2022 15:23   Physical Exam   Patient Vitals for the past 24 hrs:  BP Temp Temp src Pulse Resp SpO2 Weight  03/18/22 1619 -- 98.3 F (36.8 C) -- -- -- -- 58.1 kg  03/18/22 1618 (!) 142/88 -- Oral 84 18 -- --  03/18/22 1617 -- -- -- -- -- 99 % --   Constitutional: Well-nourished female in no apparent distress. No pallor. Anxious.  Neuro: Alert and oriented 4 Cardiovascular: Normal rate Respiratory: Normal effort and rate Abdomen: Deferred Gynecological Exam: examination not indicated  Labs: Results for orders placed or performed during the hospital encounter of 03/18/22 (from the past 24 hour(s))  CBC   Collection Time: 03/18/22  3:27 PM  Result Value Ref Range   WBC 5.1 4.0 - 10.5 K/uL   RBC 4.68 3.87 - 5.11 MIL/uL   Hemoglobin 14.7 12.0 - 15.0 g/dL  HCT 42.4 36.0 - 46.0 %   MCV 90.6 80.0 - 100.0 fL   MCH 31.4 26.0 - 34.0 pg   MCHC 34.7 30.0 - 36.0 g/dL   RDW 11.6 11.5 - 15.5 %   Platelets 233 150 - 400 K/uL   nRBC 0.0 0.0 - 0.2 %  Comprehensive metabolic panel   Collection Time: 03/18/22  3:27 PM  Result Value Ref Range   Sodium 135 135 - 145 mmol/L   Potassium 3.8 3.5 - 5.1 mmol/L   Chloride 106 98 - 111 mmol/L   CO2 22 22 - 32 mmol/L   Glucose, Bld 79  70 - 99 mg/dL   BUN 8 6 - 20 mg/dL   Creatinine, Ser 0.86 0.44 - 1.00 mg/dL   Calcium 8.7 (L) 8.9 - 10.3 mg/dL   Total Protein 6.9 6.5 - 8.1 g/dL   Albumin 4.0 3.5 - 5.0 g/dL   AST 21 15 - 41 U/L   ALT 19 0 - 44 U/L   Alkaline Phosphatase 48 38 - 126 U/L   Total Bilirubin <0.1 (L) 0.3 - 1.2 mg/dL   GFR, Estimated >60 >60 mL/min   Anion gap 7 5 - 15  hCG, quantitative, pregnancy   Collection Time: 03/18/22  3:27 PM  Result Value Ref Range   hCG, Beta Chain, Quant, S 1,591 (H) <5 mIU/mL  Urinalysis, Routine w reflex microscopic Urine, Clean Catch   Collection Time: 03/18/22  4:37 PM  Result Value Ref Range   Color, Urine YELLOW YELLOW   APPearance HAZY (A) CLEAR   Specific Gravity, Urine 1.002 (L) 1.005 - 1.030   pH 6.0 5.0 - 8.0   Glucose, UA NEGATIVE NEGATIVE mg/dL   Hgb urine dipstick MODERATE (A) NEGATIVE   Bilirubin Urine NEGATIVE NEGATIVE   Ketones, ur NEGATIVE NEGATIVE mg/dL   Protein, ur 30 (A) NEGATIVE mg/dL   Nitrite NEGATIVE NEGATIVE   Leukocytes,Ua NEGATIVE NEGATIVE   RBC / HPF 0-5 0 - 5 RBC/hpf   WBC, UA 0-5 0 - 5 WBC/hpf   Bacteria, UA RARE (A) NONE SEEN   Squamous Epithelial / HPF 0-5 0 - 5 /HPF    Ultrasound Studies:   US OB Transvaginal  Result Date: 03/18/2022 CLINICAL DATA:  Abdominal pain during first trimester of pregnancy, vaginal bleeding, inappropriate change in quantitative beta hCG in early pregnancy, prior abnormal ultrasound question ectopic pregnancy, pregnancy of unknown anatomic location EXAM: TRANSVAGINAL OB ULTRASOUND TECHNIQUE: Transvaginal ultrasound was performed for complete evaluation of the gestation as well as the maternal uterus, adnexal regions, and pelvic cul-de-sac. COMPARISON:  03/15/2022 FINDINGS: Intrauterine gestational sac: Absent Yolk sac:  N/A Embryo:  N/A Cardiac Activity: N/A Heart Rate: N/A bpm MSD:   mm    w     d CRL:     mm    w  d                  [place] EDC: Subchorionic hemorrhage:  N/A Maternal uterus/adnexae: Uterus  anteverted, normal appearance. Endometrial complex normal appearance 4 mm thick. No endometrial fluid or gestational sac, nor evidence of products of conception. No free pelvic fluid. RIGHT ovary normal size and morphology, 1.6 x 1.5 x 1.5 cm. LEFT ovary measures 2.8 x 2.0 x 1.2 cm and contains a 2.2 x 1.4 x 1.6 cm diameter thick walled ring-like slightly hyperechoic mass with central fluid component. No definite yolk sac or fetal pole seen. This could represent an intra-ovarian ectopic pregnancy or a corpus  luteum. IMPRESSION: No intrauterine gestation identified. Findings consistent with pregnancy of unknown anatomic location, with differential diagnosis early intrauterine pregnancy too early to visualize, spontaneous abortion and ectopic pregnancy. However, a 2.2 x 1.4 x 1.6 cm diameter thick-walled ring-like slightly hyperechoic mass with central fluid component is identified within LEFT ovary, increased in size since the previous exam. This could represent an intra-ovarian ectopic pregnancy or a corpus luteum, and correlation with quantitative beta hCG recommended Critical Value/emergent results were called by telephone at the time of interpretation on 03/18/2022 at 1916 hours to provider [place], who verbally acknowledged these results. Electronically Signed   By: Lavonia Dana M.D.   On: 03/18/2022 19:17     MAU course/MDM: Orders Placed This Encounter  Procedures   US OB Transvaginal   CBC   Comprehensive metabolic panel   hCG, quantitative, pregnancy   Urinalysis, Routine w reflex microscopic Urine, Clean Catch   Diet NPO time specified   Discharge patient   US shows no free fluid. Appropriate for MTX. Reviewed labs, [place] with Dr. Kennon Rounds.   The risks of methotrexate were reviewed including failure requiring repeat dosing or eventual surgery. She understands that methotrexate involves frequent return visits to monitor lab values and that she remains at risk of ectopic rupture until her  beta is less than assay. ?The patient opts to proceed with methotrexate.  She has no history of hepatic or renal dysfunction, has normal BUN/Cr/LFT's/platelets.  She is felt to be reliable for follow-up. Side effects of photosensitivity & GI upset were discussed.  She knows to avoid direct sunlight and abstain from alcohol, NSAIDs and sexual intercourse for two weeks. She was counseled to discontinue any MVI with folic acid. ?She understands to follow up on D4 (03/21/22) and D7 (03/24/22) for repeat BHCG and was given the instruction sheet. ?Strict ectopic precautions were reviewed, the patient knows to call with any abdominal pain, vomiting, fainting, or any concerns with her health.  Day 0/1 Day 4 Day 7  Sunday Wednesday Saturday  Monday Thursday Sunday  Tuesday Friday Monday  Wednesday Saturday Tuesday  Thursday Sunday Wednesday  Friday Monday Thursday  Saturday Tuesday Friday     Assessment: 1. Left tubal pregnancy without intrauterine pregnancy  2. Abdominal pain during pregnancy in first trimester  Plan: Discharge home in stable condition per consult with Dr. Kennon Rounds. Support given. Offered IBH at office. Will consider.  Ectopic precautions  Follow-up Information     [facility] Follow up.   Why: You need a lab appointment on Wednesday. The office will contact you about it being scheduled. Contact information: [address] [place] [address] [phone]               Allergies as of 03/18/2022   No Known Allergies      Medication List     STOP taking these medications    amoxicillin 500 MG capsule Commonly known as: AMOXIL   prenatal vitamin w/FE, FA 27-1 MG Tabs tablet        [place], [place], [place] 03/18/2022, 8:19 PM  2/3

## 2022-03-19 ENCOUNTER — Other Ambulatory Visit: Payer: Self-pay | Admitting: Adult Health

## 2022-03-19 LAB — CULTURE, OB URINE: Culture: <10000 — AB

## 2022-03-21 ENCOUNTER — Other Ambulatory Visit: Payer: 59

## 2022-03-21 DIAGNOSIS — O00102 Left tubal pregnancy without intrauterine pregnancy: Secondary | ICD-10-CM

## 2022-03-22 LAB — BETA HCG QUANT (REF LAB): hCG Quant: 1837 m[IU]/mL

## 2022-03-24 ENCOUNTER — Other Ambulatory Visit: Payer: Self-pay

## 2022-03-24 ENCOUNTER — Inpatient Hospital Stay (HOSPITAL_COMMUNITY)
Admission: AD | Admit: 2022-03-24 | Discharge: 2022-03-24 | Disposition: A | Discharge status: Home / Self Care | Payer: 59 | Attending: Obstetrics & Gynecology | Admitting: Obstetrics & Gynecology

## 2022-03-24 DIAGNOSIS — Z9221 Personal history of antineoplastic chemotherapy: Secondary | ICD-10-CM

## 2022-03-24 DIAGNOSIS — R109 Unspecified abdominal pain: Secondary | ICD-10-CM | POA: Diagnosis not present

## 2022-03-24 DIAGNOSIS — O209 Hemorrhage in early pregnancy, unspecified: Secondary | ICD-10-CM | POA: Insufficient documentation

## 2022-03-24 DIAGNOSIS — O26892 Other specified pregnancy related conditions, second trimester: Secondary | ICD-10-CM | POA: Diagnosis present

## 2022-03-24 DIAGNOSIS — Z3A14 14 weeks gestation of pregnancy: Secondary | ICD-10-CM | POA: Diagnosis not present

## 2022-03-24 LAB — HCG, QUANTITATIVE, PREGNANCY: hCG, Beta Chain, Quant, S: 2102 m[IU]/mL — ABNORMAL HIGH (ref <5)

## 2022-03-24 NOTE — MAU Provider Note (Signed)
S Mary Meyer is a 32 y.o. [phone] patient who presents to MAU today for Day 7 Quant hCG s/p Methotrexate administration on 03/18/2022. She reports bleeding and cramping x 3 weeks. Symptoms have not changed in the setting of MTX administration. Patient requests discharge home, CNM to call with results. Patient verbalizes understanding that she may be asked to return for additional MTX administration.  O LMP 12/17/2021 (Approximate)   Physical Exam Vitals and nursing note reviewed.  Constitutional:      General: She is not in acute distress.    Appearance: Normal appearance. She is not ill-appearing.  Cardiovascular:     Rate and Rhythm: Normal rate.  Pulmonary:     Effort: Pulmonary effort is normal.  Skin:    Capillary Refill: Capillary refill takes less than 2 seconds.  Neurological:     Mental Status: She is alert and oriented to person, place, and time.  Psychiatric:        Mood and Affect: Mood normal.        Behavior: Behavior normal.        Thought Content: Thought content normal.        Judgment: Judgment normal.    A Medical screening exam complete S/p Methotrexate administration on 03/18/2022 Quant hCG increasing since administration Discussed with Dr. Elonda Husky, who confirms patient should receive second dose  Treatment day  Single dose protocol   1  hCG.  Administer Methotrexate 50 mg/m2 body surface area IM  4  hCG  7  hCG  If <15 percent hCG decline from day 4 to 7, give additional dose of methotrexate 50 mg/m2 IM  If ?15 percent hCG decline from day 4 to 7, draw hCG weekly until undetectable   Component     Latest Ref Rng 03/18/2022 03/21/2022 03/24/2022  HCG, Beta Chain, Quant, S     <5 mIU/mL 1,591  1837 2,102     P Discharge from MAU in stable condition. CNM to call with results Patient may return to MAU as needed   F/U Patient called at home at 1808. Identity confirmed. Results reviewed Patient agreeable to second Methotrexate administration.  CNM explained she will need repeat CMET before she can receive medication Patient states she will come tomorrow during day shift  Mallie Snooks, [place], MSN, CNM 03/24/2022 6:30 PM

## 2022-03-24 NOTE — MAU Note (Signed)
Mary Meyer is a 32 y.o. at [redacted]w[redacted]d here in MAU reporting: here for repeat HCG level.   Reports S/p MTX on Sunday.  Reports continues to have intermittent  mild abdominal cramping and VB. LMP: NA Onset of complaint: 1 week ago Pain score: 2 Vitals:   03/24/22 1647  BP: 118/73  Pulse: 77  Resp: 18  Temp: 98.6 F (37 C)  SpO2: 100%     FHT:NA Lab orders placed from triage:   HCG

## 2022-03-25 ENCOUNTER — Other Ambulatory Visit (HOSPITAL_COMMUNITY)
Admission: RE | Admit: 2022-03-25 | Discharge: 2022-03-25 | Disposition: A | Discharge status: Home / Self Care | Payer: 59 | Source: Ambulatory Visit | Attending: Obstetrics & Gynecology | Admitting: Obstetrics & Gynecology

## 2022-03-25 ENCOUNTER — Other Ambulatory Visit: Payer: Self-pay

## 2022-03-25 ENCOUNTER — Inpatient Hospital Stay (HOSPITAL_COMMUNITY)
Admission: AD | Admit: 2022-03-25 | Discharge: 2022-03-25 | Disposition: A | Discharge status: Home / Self Care | Payer: 59 | Attending: Obstetrics & Gynecology | Admitting: Obstetrics & Gynecology

## 2022-03-25 DIAGNOSIS — Z9221 Personal history of antineoplastic chemotherapy: Secondary | ICD-10-CM | POA: Diagnosis not present

## 2022-03-25 DIAGNOSIS — O00102 Left tubal pregnancy without intrauterine pregnancy: Secondary | ICD-10-CM | POA: Diagnosis present

## 2022-03-25 DIAGNOSIS — Z3A14 14 weeks gestation of pregnancy: Secondary | ICD-10-CM

## 2022-03-25 LAB — COMPREHENSIVE METABOLIC PANEL
ALT: 34 U/L (ref 0–44)
AST: 25 U/L (ref 15–41)
Albumin: 3.9 g/dL (ref 3.5–5.0)
Alkaline Phosphatase: 52 U/L (ref 38–126)
Anion gap: 6 (ref 5–15)
BUN: 12 mg/dL (ref 6–20)
CO2: 26 mmol/L (ref 22–32)
Calcium: 9.4 mg/dL (ref 8.9–10.3)
Chloride: 106 mmol/L (ref 98–111)
Creatinine, Ser: 0.92 mg/dL (ref 0.44–1.00)
GFR, Estimated: >60 mL/min (ref >60)
Glucose, Bld: 87 mg/dL (ref 70–99)
Potassium: 4.3 mmol/L (ref 3.5–5.1)
Sodium: 138 mmol/L (ref 135–145)
Total Bilirubin: 0.5 mg/dL (ref 0.3–1.2)
Total Protein: 6.7 g/dL (ref 6.5–8.1)

## 2022-03-25 LAB — CBC
HCT: 41.2 % (ref 36.0–46.0)
Hemoglobin: 14.7 g/dL (ref 12.0–15.0)
MCH: 31.9 pg (ref 26.0–34.0)
MCHC: 35.7 g/dL (ref 30.0–36.0)
MCV: 89.4 fL (ref 80.0–100.0)
Platelets: 243 10*3/uL (ref 150–400)
RBC: 4.61 MIL/uL (ref 3.87–5.11)
RDW: 11.6 % (ref 11.5–15.5)
WBC: 3.6 10*3/uL — ABNORMAL LOW (ref 4.0–10.5)
nRBC: 0 % (ref 0.0–0.2)

## 2022-03-25 LAB — HCG, QUANTITATIVE, PREGNANCY: hCG, Beta Chain, Quant, S: 1893 m[IU]/mL — ABNORMAL HIGH (ref <5)

## 2022-03-25 MED ORDER — METHOTREXATE FOR ECTOPIC PREGNANCY
50.0000 mg/m2 | Freq: Once | INTRAMUSCULAR | Status: AC
  Administered 2022-03-25: 77.5 mg via INTRAMUSCULAR
  Filled 2022-03-25: qty 3.1

## 2022-03-25 NOTE — MAU Provider Note (Signed)
History   Chief Complaint:  MTX injection   Mary Meyer is  32 y.o. [id number] Patient's last menstrual period was 12/17/2021 (approximate).. Patient is here for follow up of quantitative HCG and ongoing surveillance of pregnancy status. She is 57w0dweeks gestation  by LMP.  She was seen in MAU yesterday and was told she needed to return for a second dose of MTX per Dr. EElonda Husky     Since her last visit, the patient is without new complaint. The patient reports bleeding as  none now.  She denies any pain.  General ROS:  negative  Her previous Quantitative HCG values are: 1/11: 79 1/15: 755 1/17: 824 1/18: 1086 1/19: 1106 1/21: 1591- MTX dose 1 1/24: 1837 1/27: 2102   Physical Exam   Blood pressure 120/78, pulse 78, temperature 98.2 F (36.8 C), temperature source Oral, resp. rate 18, height 5' 3"$  (1.6 m), weight 57.9 kg, last menstrual period 12/17/2021, SpO2 100 %.   Physical Exam Vitals and nursing note reviewed.  Constitutional:      General: She is not in acute distress.    Appearance: She is well-developed.  HENT:     Head: Normocephalic.  Eyes:     Pupils: Pupils are equal, round, and reactive to light.  Cardiovascular:     Rate and Rhythm: Normal rate and regular rhythm.  Pulmonary:     Effort: Pulmonary effort is normal. No respiratory distress.     Breath sounds: Normal breath sounds.  Abdominal:     Palpations: Abdomen is soft.     Tenderness: There is no abdominal tenderness.  Musculoskeletal:        General: Normal range of motion.     Cervical back: Normal range of motion.  Skin:    General: Skin is warm and dry.  Neurological:     Mental Status: She is alert and oriented to person, place, and time.  Psychiatric:        Behavior: Behavior normal.        Thought Content: Thought content normal.        Judgment: Judgment normal.    Labs: Results for orders placed or performed during the hospital encounter of 03/25/22 (from the past 24  hour(s))  CBC   Collection Time: 03/25/22  9:53 AM  Result Value Ref Range   WBC 3.6 (L) 4.0 - 10.5 K/uL   RBC 4.61 3.87 - 5.11 MIL/uL   Hemoglobin 14.7 12.0 - 15.0 g/dL   HCT 41.2 36.0 - 46.0 %   MCV 89.4 80.0 - 100.0 fL   MCH 31.9 26.0 - 34.0 pg   MCHC 35.7 30.0 - 36.0 g/dL   RDW 11.6 11.5 - 15.5 %   Platelets 243 150 - 400 K/uL   nRBC 0.0 0.0 - 0.2 %  Comprehensive metabolic panel   Collection Time: 03/25/22  9:53 AM  Result Value Ref Range   Sodium 138 135 - 145 mmol/L   Potassium 4.3 3.5 - 5.1 mmol/L   Chloride 106 98 - 111 mmol/L   CO2 26 22 - 32 mmol/L   Glucose, Bld 87 70 - 99 mg/dL   BUN 12 6 - 20 mg/dL   Creatinine, Ser 0.92 0.44 - 1.00 mg/dL   Calcium 9.4 8.9 - 10.3 mg/dL   Total Protein 6.7 6.5 - 8.1 g/dL   Albumin 3.9 3.5 - 5.0 g/dL   AST 25 15 - 41 U/L   ALT 34 0 - 44 U/L   Alkaline  Phosphatase 52 38 - 126 U/L   Total Bilirubin 0.5 0.3 - 1.2 mg/dL   GFR, Estimated >60 >60 mL/min   Anion gap 6 5 - 15  hCG, quantitative, pregnancy   Collection Time: 03/25/22  9:53 AM  Result Value Ref Range   hCG, Beta Chain, Quant, S 1,893 (H) <5 mIU/mL  Results for orders placed or performed during the hospital encounter of 03/24/22 (from the past 24 hour(s))  hCG, quantitative, pregnancy   Collection Time: 03/24/22  4:40 PM  Result Value Ref Range   hCG, Beta Chain, Quant, S 2,102 (H) <5 mIU/mL    Assessment:   1. Left tubal pregnancy without intrauterine pregnancy   2. Hx of methotrexate therapy     The risks of methotrexate were reviewed including failure requiring repeat dosing or eventual surgery. She understands that methotrexate involves frequent return visits to monitor lab values and that she remains at risk of ectopic rupture until her beta is less than assay. ?The patient opts to proceed with methotrexate.  She has no history of hepatic or renal dysfunction, has normal BUN/Cr/LFT's/platelets.  She is felt to be reliable for follow-up. Side effects of  photosensitivity & GI upset were discussed.  She knows to avoid direct sunlight and abstain from alcohol, NSAIDs and sexual intercourse for two weeks. She was counseled to discontinue any MVI with folic acid. ?She understands to follow up on D4 (1/31) and D7 (2/3) for repeat BHCG and was given the instruction sheet. ?Strict ectopic precautions were reviewed, the patient knows to call with any abdominal pain, vomiting, fainting, or any concerns with her health.  Day 0/1 Day 4 Day 7  Sunday Wednesday Saturday  Monday Thursday Sunday  Tuesday Friday Monday  Wednesday Saturday Tuesday  Thursday Sunday Wednesday  Friday Monday Thursday  Saturday Tuesday Friday   Plan: -Discharge home in stable condition -Strict ectopic precautions discussed -Patient advised to follow-up with [facility] on Wednesday for repeat labs, urgent message sent -Patient may return to MAU as needed or if her condition were to change or worsen  Wende Mott, CNM 03/25/2022, 11:15 AM

## 2022-03-25 NOTE — MAU Note (Signed)
.  Mary Meyer is a 32 y.o. at [redacted]w[redacted]d here in MAU reporting: here to receive 2nd dose of Methotrexate.  Reports mild cramping and small VB. LMP: NA Onset of complaint: 1 week ago Pain score: 2 Vitals:   03/25/22 0934  BP: 120/78  Pulse: 78  Resp: 18  Temp: 98.2 F (36.8 C)  SpO2: 100%     FHT:NA Lab orders placed from triage:   None

## 2022-03-27 ENCOUNTER — Other Ambulatory Visit: Payer: Self-pay

## 2022-03-27 ENCOUNTER — Encounter (HOSPITAL_COMMUNITY): Payer: Self-pay | Admitting: *Deleted

## 2022-03-27 ENCOUNTER — Emergency Department (HOSPITAL_COMMUNITY)
Admission: EM | Admit: 2022-03-27 | Discharge: 2022-03-28 | Disposition: A | Discharge status: Home / Self Care | Payer: 59 | Attending: Emergency Medicine | Admitting: Emergency Medicine

## 2022-03-27 DIAGNOSIS — R109 Unspecified abdominal pain: Secondary | ICD-10-CM

## 2022-03-27 DIAGNOSIS — N939 Abnormal uterine and vaginal bleeding, unspecified: Secondary | ICD-10-CM | POA: Diagnosis not present

## 2022-03-27 DIAGNOSIS — O009 Unspecified ectopic pregnancy without intrauterine pregnancy: Secondary | ICD-10-CM | POA: Diagnosis not present

## 2022-03-27 DIAGNOSIS — J45909 Unspecified asthma, uncomplicated: Secondary | ICD-10-CM | POA: Insufficient documentation

## 2022-03-27 DIAGNOSIS — R1031 Right lower quadrant pain: Secondary | ICD-10-CM | POA: Insufficient documentation

## 2022-03-27 LAB — CBC WITH DIFFERENTIAL/PLATELET
Abs Immature Granulocytes: 0.01 10*3/uL (ref 0.00–0.07)
Basophils Absolute: 0 10*3/uL (ref 0.0–0.1)
Basophils Relative: 1 %
Eosinophils Absolute: 0.1 10*3/uL (ref 0.0–0.5)
Eosinophils Relative: 2 %
HCT: 37.5 % (ref 36.0–46.0)
Hemoglobin: 12.9 g/dL (ref 12.0–15.0)
Immature Granulocytes: 0 %
Lymphocytes Relative: 30 %
Lymphs Abs: 2 10*3/uL (ref 0.7–4.0)
MCH: 31.3 pg (ref 26.0–34.0)
MCHC: 34.4 g/dL (ref 30.0–36.0)
MCV: 91 fL (ref 80.0–100.0)
Monocytes Absolute: 0.2 10*3/uL (ref 0.1–1.0)
Monocytes Relative: 3 %
Neutro Abs: 4.4 10*3/uL (ref 1.7–7.7)
Neutrophils Relative %: 64 %
Platelets: 213 10*3/uL (ref 150–400)
RBC: 4.12 MIL/uL (ref 3.87–5.11)
RDW: 11.7 % (ref 11.5–15.5)
WBC: 6.7 10*3/uL (ref 4.0–10.5)
nRBC: 0 % (ref 0.0–0.2)

## 2022-03-27 LAB — BASIC METABOLIC PANEL
Anion gap: 7 (ref 5–15)
BUN: 12 mg/dL (ref 6–20)
CO2: 25 mmol/L (ref 22–32)
Calcium: 8.6 mg/dL — ABNORMAL LOW (ref 8.9–10.3)
Chloride: 103 mmol/L (ref 98–111)
Creatinine, Ser: 0.78 mg/dL (ref 0.44–1.00)
GFR, Estimated: >60 mL/min (ref >60)
Glucose, Bld: 87 mg/dL (ref 70–99)
Potassium: 3.7 mmol/L (ref 3.5–5.1)
Sodium: 135 mmol/L (ref 135–145)

## 2022-03-27 LAB — HCG, QUANTITATIVE, PREGNANCY: hCG, Beta Chain, Quant, S: 1118 m[IU]/mL — ABNORMAL HIGH (ref <5)

## 2022-03-27 MED ORDER — SODIUM CHLORIDE 0.9 % IV BOLUS
1000.0000 mL | Freq: Once | INTRAVENOUS | Status: AC
  Administered 2022-03-27: 1000 mL via INTRAVENOUS

## 2022-03-27 NOTE — ED Provider Notes (Signed)
[facility] Provider Note   CSN: [id number] Arrival date & time: 03/27/22  2214     History {Add pertinent medical, surgical, social history, OB history to HPI:1} Chief Complaint  Patient presents with   Abdominal Pain   Vaginal Bleeding    Ectopic pregnancy     Mary Meyer is a 32 y.o. female.  Patient states that she started to have right lower quadrant abdominal pain tonight with moderate to heavy bleeding.  She was diagnosed with an ectopic pregnancy on January 11.  She has been treated with 2 doses of methotrexate 1 on January 21 and 1 on January 28..  Patient has had 4 pregnancies.  She has 1 live birth and 2 previous miscarriages and this is ectopic.  She has a history of asthma  The history is provided by the patient and medical records. No language interpreter was used.  Abdominal Pain Pain location:  RLQ Pain quality: aching   Pain radiates to:  Does not radiate Pain severity:  Moderate Onset quality:  Sudden Timing:  Intermittent Progression:  Waxing and waning Chronicity:  New Context: not alcohol use   Relieved by:  Nothing Worsened by:  Nothing Ineffective treatments:  None tried Associated symptoms: vaginal bleeding   Associated symptoms: no chest pain, no cough, no diarrhea, no fatigue and no hematuria   Vaginal Bleeding Associated symptoms: abdominal pain   Associated symptoms: no back pain and no fatigue        Home Medications Prior to Admission medications   Not on File      Allergies    Patient has no known allergies.    Review of Systems   Review of Systems  Constitutional:  Negative for appetite change and fatigue.  HENT:  Negative for congestion, ear discharge and sinus pressure.   Eyes:  Negative for discharge.  Respiratory:  Negative for cough.   Cardiovascular:  Negative for chest pain.  Gastrointestinal:  Positive for abdominal pain. Negative for diarrhea.  Genitourinary:   Positive for vaginal bleeding. Negative for frequency and hematuria.  Musculoskeletal:  Negative for back pain.  Skin:  Negative for rash.  Neurological:  Negative for seizures and headaches.  Psychiatric/Behavioral:  Negative for hallucinations.     Physical Exam Updated Vital Signs BP (!) 127/97 (BP Location: Right Arm)   Pulse 88   Temp 97.7 F (36.5 C) (Oral)   Resp 16   Ht 5\' 3"  (1.6 m)   Wt 57.9 kg   LMP 12/27/2021 (Approximate)   SpO2 100%   BMI 22.62 kg/m  Physical Exam Vitals and nursing note reviewed.  Constitutional:      Appearance: She is well-developed.  HENT:     Head: Normocephalic.     Nose: Nose normal.  Eyes:     General: No scleral icterus.    Conjunctiva/sclera: Conjunctivae normal.  Neck:     Thyroid: No thyromegaly.  Cardiovascular:     Rate and Rhythm: Normal rate and regular rhythm.     Heart sounds: No murmur heard.    No friction rub. No gallop.  Pulmonary:     Breath sounds: No stridor. No wheezing or rales.  Chest:     Chest wall: No tenderness.  Abdominal:     General: There is no distension.     Tenderness: There is abdominal tenderness. There is no rebound.     Comments: Moderate right lower quadrant tenderness  Genitourinary:    Comments:  Pelvic exam.  Speculum exam shows moderate amount of blood.  Bimanual exam shows tenderness to the right adnexal.  No masses felt Musculoskeletal:        General: Normal range of motion.     Cervical back: Neck supple.  Lymphadenopathy:     Cervical: No cervical adenopathy.  Skin:    Findings: No erythema or rash.  Neurological:     Mental Status: She is oriented to person, place, and time.     Motor: No abnormal muscle tone.     Coordination: Coordination normal.  Psychiatric:        Behavior: Behavior normal.     ED Results / Procedures / Treatments   Labs (all labs ordered are listed, but only abnormal results are displayed) Labs Reviewed  CBC WITH DIFFERENTIAL/PLATELET  BASIC  METABOLIC PANEL  HCG, QUANTITATIVE, PREGNANCY    EKG None  Radiology No results found.  Procedures Procedures  {Document cardiac monitor, telemetry assessment procedure when appropriate:1}  Medications Ordered in ED Medications  sodium chloride 0.9 % bolus 1,000 mL (1,000 mLs Intravenous New Bag/Given 03/27/22 2303)    ED Course/ Medical Decision Making/ A&P  Patient with ectopic pregnancy and has been treated twice with methotrexate.  I contacted her OB/GYN doctor Elonda Husky and he stated that the patient could follow back up Monday as long as her symptoms seem to improve.  We are checking another quantitative beta-hCG and basic labs {   Click here for ABCD2, HEART and other calculatorsREFRESH Note before signing :1}                          Medical Decision Making Amount and/or Complexity of Data Reviewed Labs: ordered.  Ectopic pregnancy that has been treated with 2 injections of methotrexate.  Labs pending.  Dr. Dina Rich will disposition the patient after reevaluation and the labs are back  {Document critical care time when appropriate:1} {Document review of labs and clinical decision tools ie heart score, Chads2Vasc2 etc:1}  {Document your independent review of radiology images, and any outside records:1} {Document your discussion with family members, caretakers, and with consultants:1} {Document social determinants of health affecting pt's care:1} {Document your decision making why or why not admission, treatments were needed:1} Final Clinical Impression(s) / ED Diagnoses Final diagnoses:  None    Rx / DC Orders ED Discharge Orders     None

## 2022-03-27 NOTE — ED Triage Notes (Signed)
Pt with abd pain, pt currently being treated for ectopic pregnancy and received second shot Methotrexate  on Sunday.  + heavy vaginal bleeding.  Pain has became severe. Sees [facility] .

## 2022-03-28 ENCOUNTER — Other Ambulatory Visit: Payer: 59

## 2022-03-28 DIAGNOSIS — Z9221 Personal history of antineoplastic chemotherapy: Secondary | ICD-10-CM

## 2022-03-28 DIAGNOSIS — O00102 Left tubal pregnancy without intrauterine pregnancy: Secondary | ICD-10-CM

## 2022-03-28 NOTE — Discharge Instructions (Signed)
You were seen today for abdominal pain.  This is likely related to your known ectopic pregnancy and treatment with methotrexate.  Your beta-hCG levels are going down and your other labs are reassuring.  Follow-up closely with OB/GYN.  If you have worsening pain, recurrent pain, dizziness, syncope, you should be reevaluated immediately.

## 2022-03-28 NOTE — ED Provider Notes (Signed)
Patient signed out pending lab work.  Labs are largely reassuring.  Beta hCG level downtrending to 1118.  Globin is 12.9.  On recheck, patient is comfortable.  Vital signs are reassuring.  She denies lightheadedness or dizziness.  She states pain improved with her tramadol from home.  She still has some tenderness on exam.  Bedside ultrasound does not show any free fluid in the abdomen or pelvis which is highly reassuring.  Per Dr. Ellsworth Lennox discussions with Dr. Elonda Husky, patient will follow-up closely tomorrow. Physical Exam  BP 124/86   Pulse 84   Temp 97.7 F (36.5 C) (Oral)   Resp 16   Ht 1.6 m (5\' 3" )   Wt 57.9 kg   LMP 12/27/2021 (Approximate)   SpO2 99%   BMI 22.62 kg/m   Physical Exam Awake, alert, no acute distress Tenderness to palpation lower abdomen, no rebound or guarding Procedures  Procedures  ED Course / MDM    Medical Decision Making Amount and/or Complexity of Data Reviewed Labs: ordered.   Problem List Items Addressed This Visit   None Visit Diagnoses     Abdominal pain, unspecified abdominal location    -  Primary   Ectopic pregnancy without intrauterine pregnancy, unspecified location                 Merryl Hacker, MD 03/28/22 [phone]

## 2022-04-03 LAB — BETA HCG QUANT (REF LAB): hCG Quant: 657 m[IU]/mL

## 2022-04-04 ENCOUNTER — Encounter: Payer: Self-pay | Admitting: Adult Health

## 2022-04-04 ENCOUNTER — Ambulatory Visit: Payer: 59 | Admitting: Adult Health

## 2022-04-04 VITALS — BP 114/81 | HR 86 | Ht 63.0 in | Wt 127.0 lb

## 2022-04-04 DIAGNOSIS — Z01419 Encounter for gynecological examination (general) (routine) without abnormal findings: Secondary | ICD-10-CM

## 2022-04-04 DIAGNOSIS — F32A Depression, unspecified: Secondary | ICD-10-CM

## 2022-04-04 DIAGNOSIS — O00102 Left tubal pregnancy without intrauterine pregnancy: Secondary | ICD-10-CM

## 2022-04-04 DIAGNOSIS — Z9221 Personal history of antineoplastic chemotherapy: Secondary | ICD-10-CM

## 2022-04-04 MED ORDER — ESCITALOPRAM OXALATE 10 MG PO TABS: 10.0000 mg | ORAL_TABLET | Freq: Every day | ORAL | 6 refills | Status: DC

## 2022-04-04 NOTE — Progress Notes (Addendum)
Patient ID: Mary Meyer, female   DOB: [birth year], 32 y.o.   MRN: [id number] History of Present Illness: Jarita is a 32 year old white female,single, G4P1, in for a well woman gyn exam and pap. She recently was treated with methotrexate twice for left ectopic. She is bleeding today and still has some pressure.    Current Medications, Allergies, Past Medical History, Past Surgical History, Family History and Social History were reviewed in [employer] record.     Review of Systems: Patient denies any headaches, hearing loss, fatigue, blurred vision, shortness of breath, chest pain, abdominal pain, problems with bowel movements, urination, or intercourse. No joint pain or mood swings.  See HPI for positives   Physical Exam:BP 114/81 (BP Location: Left Arm, Patient Position: Sitting, Cuff Size: Normal)   Pulse 86   Ht 5\' 3"  (1.6 m)   Wt 127 lb (57.6 kg)   LMP 12/27/2021 (Approximate)   Breastfeeding No   BMI 22.50 kg/m   General:  Well developed, well nourished, no acute distress Skin:  Warm and dry Neck:  Midline trachea, normal thyroid, good ROM, no lymphadenopathy Lungs; Clear to auscultation bilaterally Breast:  No dominant palpable mass, retraction, or nipple discharge Cardiovascular: Regular rate and rhythm Abdomen:  Soft, non tender, no hepatosplenomegaly Pelvic:  External genitalia is normal in appearance, no lesions.  The vagina is normal in appearance,+blood. Urethra has no lesions or masses. The cervix is bulbous.pap with HR HPV genotyping performed.  Uterus is felt to be normal size, shape, and contour.  No adnexal masses or tenderness noted.Bladder is mildly tender, no masses felt. Extremities/musculoskeletal:  No swelling or varicosities noted, no clubbing or cyanosis Psych:  No mood changes, alert and cooperative,seems happy AA is 5 Fall risk is low    04/04/2022   11:35 AM 07/18/2021    2:01 PM 03/06/2021    1:58 PM  Depression screen  PHQ 2/9  Decreased Interest 3 1 1   Down, Depressed, Hopeless 3 3 1   PHQ - 2 Score 6 4 2   Altered sleeping 3 3 3   Tired, decreased energy 3 2 3   Change in appetite 0 0 1  Feeling bad or failure about yourself  1 2 0  Trouble concentrating 1 1 0  Moving slowly or fidgety/restless 0 2 0  Suicidal thoughts 0 0 0  PHQ-9 Score 14 14 9   Difficult doing work/chores  Very difficult Somewhat difficult       04/04/2022   11:35 AM 02/02/2021    1:25 PM 11/29/2020   10:59 AM 05/02/2020    4:05 PM  GAD 7 : Generalized Anxiety Score  Nervous, Anxious, on Edge 2 1 0 3  Control/stop worrying 3 3 0 3  Worry too much - different things 1 3 0 3  Trouble relaxing 3 2 1 1   Restless 1 3 1  0  Easily annoyed or irritable 1 3 0 3  Afraid - awful might happen 0 3 0 3  Total GAD 7 Score 11 18 2 16   Anxiety Difficulty  Somewhat difficult        Upstream - 04/04/22 1134       Pregnancy Intention Screening   Does the patient want to become pregnant in the next year? No    Does the patient's partner want to become pregnant in the next year? No    Would the patient like to discuss contraceptive options today? Yes      Contraception Wrap Up  Current Method Abstinence    End Method Abstinence    Contraception Counseling Provided Yes            Examination chaperoned by Levy Pupa LPN   Impression and Plan: 1. Encounter for gynecological examination with Papanicolaou smear of cervix Pap sent Pap in 3 years if normal Physical in 1 year  - Cytology - PAP( [facility])  2. Left tubal pregnancy without intrauterine pregnancy Check QHCG 04/09/22 - Beta hCG quant (ref lab) Discussed birth control, will start [place], when QHCG  <5  3. Hx of methotrexate therapy Check QHCG 04/09/22 - Beta hCG quant (ref lab)  4. Anxiety and depression Will rx lexapro, has used in past Meds ordered this encounter  Medications   escitalopram (LEXAPRO) 10 MG tablet    Sig: Take 1 tablet (10 mg total) by mouth  daily.    Dispense:  30 tablet    Refill:  6    Order Specific Question:   Supervising Provider    Answer:   Tania Ade H [[id number]]     Follow up in 8 weeks for ROS

## 2022-04-10 ENCOUNTER — Other Ambulatory Visit: Payer: Self-pay | Admitting: Adult Health

## 2022-04-10 DIAGNOSIS — Z9221 Personal history of antineoplastic chemotherapy: Secondary | ICD-10-CM

## 2022-04-10 LAB — BETA HCG QUANT (REF LAB): hCG Quant: 392 m[IU]/mL

## 2022-04-10 LAB — CYTOLOGY - PAP
Adequacy: ABNORMAL
Comment: NEGATIVE

## 2022-04-17 ENCOUNTER — Other Ambulatory Visit: Payer: Self-pay | Admitting: Adult Health

## 2022-04-17 DIAGNOSIS — Z9221 Personal history of antineoplastic chemotherapy: Secondary | ICD-10-CM

## 2022-04-17 LAB — BETA HCG QUANT (REF LAB): hCG Quant: 267 m[IU]/mL

## 2022-04-17 NOTE — Progress Notes (Signed)
Ck QHCG

## 2022-04-18 ENCOUNTER — Other Ambulatory Visit (HOSPITAL_COMMUNITY)
Admission: RE | Admit: 2022-04-18 | Discharge: 2022-04-18 | Disposition: A | Discharge status: Home / Self Care | Payer: 59 | Source: Ambulatory Visit | Attending: Adult Health | Admitting: Adult Health

## 2022-04-18 ENCOUNTER — Encounter: Payer: Self-pay | Admitting: Adult Health

## 2022-04-18 ENCOUNTER — Ambulatory Visit (INDEPENDENT_AMBULATORY_CARE_PROVIDER_SITE_OTHER): Payer: 59 | Admitting: Adult Health

## 2022-04-18 VITALS — BP 117/78 | HR 73 | Ht 63.0 in | Wt 126.0 lb

## 2022-04-18 DIAGNOSIS — Z124 Encounter for screening for malignant neoplasm of cervix: Secondary | ICD-10-CM | POA: Insufficient documentation

## 2022-04-18 NOTE — Progress Notes (Signed)
  Subjective:     Patient ID: Mary Meyer, female   DOB: Nov 05, 1990, 32 y.o.   MRN: [id number]  HPI [name] is a 32 year old white female,single, [id number] in for a repeat pap that was unsatisfactory, she was bleeding, will do at no charge visit.   Review of Systems Spotting some  Denies any pain Reviewed past medical,surgical, social and family history. Reviewed medications and allergies.     Objective:   Physical Exam BP 117/78 (BP Location: Right Arm, Patient Position: Sitting, Cuff Size: Normal)   Pulse 73   Ht 5' 3"$  (1.6 m)   Wt 126 lb (57.2 kg)   LMP 12/27/2021 (Approximate)   Breastfeeding No   BMI 22.32 kg/m     Skin warm and dry.Pelvic: external genitalia is normal in appearance no lesions, vagina: light tan discharge without odor,urethra has no lesions or masses noted, cervix is  bulbous,everted at os, pap with HR HPV genotyping performed, uterus: normal size, shape and contour, non tender, no masses felt, adnexa: no masses or tenderness noted. Bladder is non tender and no masses felt.  Examination chaperoned by Alvera Singh NP student. Assessment:     1. Routine cervical smear Pap was unsatisfactory due to bleeding 04/04/22 Pap sent Pap in 3 years if normal     Plan:     Follow up prn

## 2022-04-20 LAB — CYTOLOGY - PAP
Comment: NEGATIVE
Diagnosis: NEGATIVE
High risk HPV: NEGATIVE

## 2022-04-24 ENCOUNTER — Other Ambulatory Visit: Payer: Self-pay | Admitting: Adult Health

## 2022-04-24 DIAGNOSIS — Z9221 Personal history of antineoplastic chemotherapy: Secondary | ICD-10-CM

## 2022-04-24 LAB — BETA HCG QUANT (REF LAB): hCG Quant: 37 m[IU]/mL

## 2022-04-24 NOTE — Progress Notes (Signed)
Ck QHCG

## 2022-05-01 LAB — BETA HCG QUANT (REF LAB): hCG Quant: 2 m[IU]/mL

## 2022-05-04 ENCOUNTER — Other Ambulatory Visit: Payer: Self-pay | Admitting: Adult Health

## 2022-05-04 MED ORDER — NORETHIN ACE-ETH ESTRAD-FE 1-20 MG-MCG PO TABS: 1.0000 | ORAL_TABLET | Freq: Every day | ORAL | 4 refills | Status: DC

## 2022-05-04 NOTE — Progress Notes (Signed)
Rx junel 1/20

## 2022-05-30 ENCOUNTER — Ambulatory Visit: Payer: 59 | Admitting: Adult Health

## 2022-11-14 ENCOUNTER — Ambulatory Visit: Payer: Medicaid Other | Admitting: Internal Medicine

## 2022-11-14 ENCOUNTER — Encounter: Payer: Self-pay | Admitting: Internal Medicine

## 2022-11-14 VITALS — BP 138/90 | HR 89 | Temp 98.5°F | Ht 63.0 in | Wt 130.5 lb

## 2022-11-14 DIAGNOSIS — F32A Depression, unspecified: Secondary | ICD-10-CM | POA: Diagnosis not present

## 2022-11-14 DIAGNOSIS — H9203 Otalgia, bilateral: Secondary | ICD-10-CM

## 2022-11-14 DIAGNOSIS — F329 Major depressive disorder, single episode, unspecified: Secondary | ICD-10-CM

## 2022-11-14 MED ORDER — AMOXICILLIN-POT CLAVULANATE 500-125 MG PO TABS: 1.0000 | ORAL_TABLET | Freq: Three times a day (TID) | ORAL | 0 refills | Status: AC

## 2022-11-14 MED ORDER — FLUTICASONE PROPIONATE 50 MCG/ACT NA SUSP: 1.0000 | Freq: Every day | NASAL | 2 refills | Status: DC

## 2022-11-14 MED ORDER — ESCITALOPRAM OXALATE 10 MG PO TABS: 10.0000 mg | ORAL_TABLET | Freq: Every day | ORAL | 6 refills | Status: DC

## 2022-11-14 NOTE — Patient Instructions (Addendum)
Ms Ehresman,   For your ear pain, I am prescribing a 5 day course of an antibiotic called Augmentin, as well as a decongestant called Flonase to relieve pressure. Please call the clinic if your symptoms are not better after you  complete the antibiotic.   I am also ordering Lexapro 10mg . We will plan to see you again in about 6 weeks to see how that is going and if you need any adjustments.   Thanks,  Dr Carlynn Purl

## 2022-11-14 NOTE — Assessment & Plan Note (Signed)
Patient reports 3 weeks of bilateral ear pain and pressure, muffled hearing, and occasional ringing. No drainage. She has had one episode of right ear infection several years ago. She does not swim, denies foreign body insertion. Ear exam shows pain to otoscope insertion, no wax buildup. Tympanic membranes appear gray with some bulging of the right membrane. Bilateral acute otitis media would be irregular but seems most likely.  -5 day course of Augmentin prescribed -Flonase to decrease sinus pressure -Pt instructed to call clinic if symptoms do not improve after abx course

## 2022-11-14 NOTE — Progress Notes (Signed)
    Subjective:  CC: ear pain, depression  HPI:  Mary Meyer is a 32 y.o. female with a past medical history stated below and presents today for above. Please see problem based assessment and plan for additional details.  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  Pt states she had a stent placed during the cath/ablation, but records don't reflect this.  Cards appt 04/26/14 @ [facility]: EKG & Holter monitor all returned normal per pt, no further f/u needed per cards    Urinary tract infection without hematuria 07/10/2019   Vaginal Pap smear, abnormal     Current Outpatient Medications on File Prior to Visit  Medication Sig Dispense Refill   norethindrone-ethinyl estradiol-FE (LOESTRIN FE) 1-20 MG-MCG tablet Take 1 tablet by mouth daily. 84 tablet 4   No current facility-administered medications on file prior to visit.    Review of Systems: ROS negative except for as is noted on the assessment and plan.  Objective:   Vitals:   11/14/22 1443  BP: (!) 138/90  Pulse: 89  Temp: 98.5 F (36.9 C)  TempSrc: Oral  SpO2: 100%  Weight: 130 lb 8 oz (59.2 kg)  Height: 5\' 3"  (1.6 m)    Physical Exam: Constitutional: well-appearing, in no acute distress HENT: nonerythematous tympanic membranes, some bulging of right membrane Eyes: conjunctiva non-erythematous Neck: supple Cardiovascular: regular rate and rhythm, no m/r/g Pulmonary/Chest: normal work of breathing on room air, lungs clear to auscultation bilaterally Abdominal: soft, non-tender, non-distended MSK: normal bulk and tone Neurological: alert &  oriented x 3, 5/5 strength in bilateral upper and lower extremities, normal gait Skin: warm and dry  Assessment & Plan:   Depression Patient reports increased depression for the past several months. She reports increased irritability, anxiety, poor concentration, trouble sleeping, and decreased enjoyment from being around people. Denies SI/HI. PHQ9 score is 15, GAD7 score is 13. She has been on Lexapro in the past but stopped due to night sweats. She feels she would like to be restarted on Lexapro.  -Lexapro 10mg  ordered -Follow up scheduled for 6 weeks  Ear pain, bilateral Patient reports 3 weeks of bilateral ear pain and pressure, muffled hearing, and occasional ringing. No drainage. She has had one episode of right ear infection several years ago. She does not swim, denies foreign body insertion. Ear exam shows pain to otoscope insertion, no wax buildup. Tympanic membranes appear gray with some bulging of the right membrane. Bilateral acute otitis media would be irregular but seems most likely.  -5 day course of Augmentin prescribed -Flonase to decrease sinus pressure -Pt instructed to call clinic if symptoms do not improve after abx course    Patient seen with Dr. Rance Muir MD [facility]  PGY-1 Pager: [phone] Date 11/14/2022  Time 3:39 PM

## 2022-11-14 NOTE — Assessment & Plan Note (Signed)
Patient reports increased depression for the past several months. She reports increased irritability, anxiety, poor concentration, trouble sleeping, and decreased enjoyment from being around people. Denies SI/HI. PHQ9 score is 15, GAD7 score is 13. She has been on Lexapro in the past but stopped due to night sweats. She feels she would like to be restarted on Lexapro.  -Lexapro 10mg  ordered -Follow up scheduled for 6 weeks

## 2022-11-19 DIAGNOSIS — R079 Chest pain, unspecified: Secondary | ICD-10-CM | POA: Diagnosis not present

## 2022-11-19 DIAGNOSIS — Z6822 Body mass index (BMI) 22.0-22.9, adult: Secondary | ICD-10-CM | POA: Diagnosis not present

## 2022-11-20 ENCOUNTER — Telehealth: Payer: Self-pay | Admitting: *Deleted

## 2022-11-20 NOTE — Telephone Encounter (Signed)
Received a call from pt who stated she saw Dr Carlynn Purl on 9/18 and an abx was prescribed, Augmentin. Stated she started having CP yesterday. She went to an UC, [facility], last night. And was told EKG looks ok and if she starts to have irregular heartbeat an to go to the ED. Pt stated she has 1 Augmentin tab left. She denies fever,shob,dizzy. I also advised pt to go the ED if CP continues. And I will inform the doctor.

## 2022-11-20 NOTE — Progress Notes (Signed)
Internal Medicine Clinic Attending  I was physically present during the key portions of the resident provided service and participated in the medical decision making of patient's management care. I reviewed pertinent patient test results.  The assessment, diagnosis, and plan were formulated together and I agree with the documentation in the resident's note.  Right TM bulging, c/f acute otitis media. Suspect some ETD given bilateral ear symptoms, R > L. Augmentin for right AOM with Flonase. Close f/u scheduled for depression monitoring.  Dickie La, MD

## 2022-12-26 ENCOUNTER — Encounter: Payer: Medicaid Other | Admitting: Internal Medicine

## 2023-01-09 ENCOUNTER — Encounter: Payer: Self-pay | Admitting: Internal Medicine

## 2023-01-09 ENCOUNTER — Ambulatory Visit: Payer: Medicaid Other | Admitting: Internal Medicine

## 2023-01-09 VITALS — BP 132/86 | HR 85 | Temp 98.1°F | Ht 63.0 in | Wt 136.5 lb

## 2023-01-09 DIAGNOSIS — F32A Depression, unspecified: Secondary | ICD-10-CM

## 2023-01-09 DIAGNOSIS — H9203 Otalgia, bilateral: Secondary | ICD-10-CM | POA: Diagnosis not present

## 2023-01-09 DIAGNOSIS — F329 Major depressive disorder, single episode, unspecified: Secondary | ICD-10-CM

## 2023-01-09 MED ORDER — ESCITALOPRAM OXALATE 10 MG PO TABS: 20.0000 mg | ORAL_TABLET | Freq: Every day | ORAL | 6 refills | Status: DC

## 2023-01-09 MED ORDER — CEFDINIR 300 MG PO CAPS: 600.0000 mg | ORAL_CAPSULE | Freq: Every day | ORAL | 0 refills | Status: DC

## 2023-01-09 NOTE — Patient Instructions (Signed)
Ms Mary Meyer,   For your ear pain, I am prescribing a new antibiotic. If this causes problems, please call the clinic and I will try something else.   I am increasing your Lexapro dosage to 20mg  daily. Let's plan on seeing you again in clinic in another 2 months to see how this is working for you.   Thanks,  Dr Carlynn Purl

## 2023-01-09 NOTE — Assessment & Plan Note (Signed)
Patient here for follow up of depressive symptoms with recent re-initiation of Lexapro 10mg . Patient feels her mood is mostly unchanged since beginning SSRI. She has had a decrease in libido. Patient feels she would be okay increasing medication, and that perhaps increased mood would help with decreased libido.  -Increase Lexapro to 20mg  daily -Follow up in 2 months

## 2023-01-09 NOTE — Assessment & Plan Note (Signed)
Patient continues to have bilateral ear pain and mild hearing loss. She was prescribed Augmentin at last visit which caused chest pain. She stopped taking the antibiotic before completing the entire course.  -5 day course of Cefdinir for presumed acute otitis media prescribed with instructions to call clinic if she experiences side effects

## 2023-01-09 NOTE — Progress Notes (Signed)
    Subjective:  CC: depression, ear pain  HPI:  [name] is a 32 y.o. female with a past medical history stated below and presents today for above. Please see problem based assessment and plan for additional details.  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  Pt states she had a stent placed during the cath/ablation, but records don't reflect this.  Cards appt 04/26/14 @ [facility]: EKG & Holter monitor all returned normal per pt, no further f/u needed per cards    Urinary tract infection without hematuria 07/10/2019   Vaginal Pap smear, abnormal     Current Outpatient Medications on File Prior to Visit  Medication Sig Dispense Refill   norethindrone-ethinyl estradiol-FE (LOESTRIN FE) 1-20 MG-MCG tablet Take 1 tablet by mouth daily. 84 tablet 4   fluticasone (FLONASE) 50 MCG/ACT nasal spray Place 1 spray into both nostrils daily. 9.9 mL 2   No current facility-administered medications on file prior to visit.    Review of Systems: ROS negative except for as is noted on the assessment and plan.  Objective:   Vitals:   01/09/23 1355  BP: 132/86  Pulse: 85  Temp: 98.1 F (36.7 C)  TempSrc: Oral  SpO2: 100%  Weight: 136 lb 8 oz (61.9 kg)  Height: 5\' 3"  (1.6 m)    Physical Exam: Constitutional: well-appearing, in no acute distress HENT: normocephalic atraumatic, mucous membranes moist Cardiovascular: regular rate and rhythm, no m/r/g Pulmonary/Chest: normal work of breathing on room air, lungs clear to auscultation bilaterally Abdominal: soft, non-tender, non-distended MSK: normal bulk and  tone Neurological: alert & oriented x 3, 5/5 strength in bilateral upper and lower extremities Skin: warm and dry  Assessment & Plan:   Depression Patient here for follow up of depressive symptoms with recent re-initiation of Lexapro 10mg . Patient feels her mood is mostly unchanged since beginning SSRI. She has had a decrease in libido. Patient feels she would be okay increasing medication, and that perhaps increased mood would help with decreased libido.  -Increase Lexapro to 20mg  daily -Follow up in 2 months  Ear pain, bilateral Patient continues to have bilateral ear pain and mild hearing loss. She was prescribed Augmentin at last visit which caused chest pain. She stopped taking the antibiotic before completing the entire course.  -5 day course of Cefdinir for presumed acute otitis media prescribed with instructions to call clinic if she experiences side effects    Patient seen with Dr. Bari Edward MD [facility] Internal Medicine  PGY-1 Pager: [phone] Date 01/09/2023  Time 3:01 PM

## 2023-01-18 NOTE — Progress Notes (Signed)
Internal Medicine Clinic Attending  I was physically present during the key portions of the resident provided service and participated in the medical decision making of patient's management care. I reviewed pertinent patient test results.  The assessment, diagnosis, and plan were formulated together and I agree with the documentation in the resident's note.  Agree with plan for trial of new antibiotics.  Follow up if again no improvement.   Inez Catalina, MD

## 2023-03-11 ENCOUNTER — Encounter: Payer: Medicaid Other | Admitting: Student

## 2023-03-18 DIAGNOSIS — J22 Unspecified acute lower respiratory infection: Secondary | ICD-10-CM | POA: Diagnosis not present

## 2023-03-18 DIAGNOSIS — R059 Cough, unspecified: Secondary | ICD-10-CM | POA: Diagnosis not present

## 2023-03-26 ENCOUNTER — Encounter: Payer: Medicaid Other | Admitting: Student

## 2023-04-08 ENCOUNTER — Encounter: Payer: Medicaid Other | Admitting: Student

## 2023-04-23 ENCOUNTER — Ambulatory Visit: Payer: Medicaid Other | Admitting: Student

## 2023-04-23 VITALS — BP 130/89 | HR 87 | Ht 63.0 in | Wt 149.2 lb

## 2023-04-23 DIAGNOSIS — F419 Anxiety disorder, unspecified: Secondary | ICD-10-CM

## 2023-04-23 DIAGNOSIS — F329 Major depressive disorder, single episode, unspecified: Secondary | ICD-10-CM

## 2023-04-23 DIAGNOSIS — F32A Depression, unspecified: Secondary | ICD-10-CM | POA: Diagnosis not present

## 2023-04-23 DIAGNOSIS — Z23 Encounter for immunization: Secondary | ICD-10-CM | POA: Diagnosis not present

## 2023-04-23 DIAGNOSIS — Z0000 Encounter for general adult medical examination without abnormal findings: Secondary | ICD-10-CM

## 2023-04-23 MED ORDER — ESCITALOPRAM OXALATE 20 MG PO TABS: 20.0000 mg | ORAL_TABLET | Freq: Every day | ORAL | 3 refills | Status: DC

## 2023-04-23 MED ORDER — BUSPIRONE HCL 7.5 MG PO TABS: 7.5000 mg | ORAL_TABLET | Freq: Two times a day (BID) | ORAL | 3 refills | Status: DC

## 2023-04-23 NOTE — Progress Notes (Unsigned)
 CC: Follow-up  HPI:  [name] is a 33 y.o. female living with a history stated below and presents today for follow-up. Please see problem based assessment and plan for additional details.  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  Pt states she had a stent placed during the cath/ablation, but records don't reflect this.  Cards appt 04/26/14 @ [facility]: EKG & Holter monitor all returned normal per pt, no further f/u needed per cards    Urinary tract infection without hematuria 07/10/2019   Vaginal Pap smear, abnormal     Current Outpatient Medications on File Prior to Visit  Medication Sig Dispense Refill   norethindrone-ethinyl estradiol-FE (LOESTRIN FE) 1-20 MG-MCG tablet Take 1 tablet by mouth daily. 84 tablet 4   cefdinir (OMNICEF) 300 MG capsule Take 2 capsules (600 mg total) by mouth daily. 10 capsule 0   escitalopram (LEXAPRO) 10 MG tablet Take 2 tablets (20 mg total) by mouth daily. 30 tablet 6   fluticasone (FLONASE) 50 MCG/ACT nasal spray Place 1 spray into both nostrils daily. 9.9 mL 2   No current facility-administered medications on file prior to visit.    Family History  Problem Relation Age of Onset   Thyroid disease Maternal Aunt    Heart Problems Maternal Aunt    Cancer Maternal Grandmother    Other Maternal Grandmother        brain tumor   Cancer Maternal Grandfather     Social History   Socioeconomic History   Marital status: Single    Spouse name: Not on file   Number of children: 1   Years of education: Not on file   Highest education level: Not on file  Occupational  History   Not on file  Tobacco Use   Smoking status: Former    Passive exposure: Never   Smokeless tobacco: Current   Tobacco comments:    Vapes daily  Vaping Use   Vaping status: Former   Substances: Nicotine, Flavoring  Substance and Sexual Activity   Alcohol use: Yes    Comment: occ    Drug use: No   Sexual activity: Not Currently    Birth control/protection: None, Abstinence  Other Topics Concern   Not on file  Social History Narrative   Not on file   Social Drivers of Health   Financial Resource Strain: Low Risk  (04/04/2022)   Overall Financial Resource Strain (CARDIA)    Difficulty of Paying Living Expenses: Not hard at all  Food Insecurity: No Food Insecurity (04/04/2022)   Hunger Vital Sign    Worried About Running Out of Food in the Last Year: Never true    Ran Out of Food in the Last Year: Never true  Transportation Needs: No Transportation Needs (04/04/2022)   PRAPARE - Administrator, Civil Service (Medical): No    Lack of Transportation (Non-Medical): No  Physical Activity: Sufficiently Active (04/04/2022)   Exercise Vital Sign    Days of Exercise per Week: 6 days    Minutes of Exercise per Session: 40 min  Stress: Stress Concern  Present (04/04/2022)   [employer] of Occupational Health - Occupational Stress Questionnaire    Feeling of Stress : To some extent  Social Connections: Socially Isolated (04/04/2022)   Social Connection and Isolation Panel [NHANES]    Frequency of Communication with Friends and Family: Once a week    Frequency of Social Gatherings with Friends and Family: Never    Attends Religious Services: Never    Database administrator or Organizations: No    Attends Banker Meetings: Never    Marital Status: Living with partner  Intimate Partner Violence: Not At Risk (04/04/2022)   Humiliation, Afraid, Rape, and Kick questionnaire    Fear of Current or Ex-Partner: No    Emotionally Abused: No    Physically Abused: No     Sexually Abused: No    Review of Systems: ROS negative except for what is noted on the assessment and plan.  Vitals:   04/23/23 1425  BP: 130/89  Pulse: 87  Weight: 149 lb 3.2 oz (67.7 kg)  Height: 5\' 3"  (1.6 m)    Physical Exam: Constitutional: well-appearing, sitting in chair, in no acute distress Cardiovascular: regular rate and rhythm, no m/r/g Pulmonary/Chest: normal work of breathing on room air, lungs clear to auscultation bilaterally Abdominal: soft, non-tender, non-distended MSK: normal bulk and tone Skin: warm and dry Psych: normal mood and behavior  Assessment & Plan:     Patient discussed with Dr. Lafonda Mosses  Depression Managed with Lexapro 20 mg/daily.    Mary Meyer, D.O. [facility] Internal Medicine, PGY-1 Phone: [phone] Date 04/23/2023 Time 2:42 PM

## 2023-04-23 NOTE — Assessment & Plan Note (Addendum)
 Managed with Lexapro 20 mg/daily.  Trazadone, fluoxetine.

## 2023-04-24 ENCOUNTER — Encounter: Payer: Self-pay | Admitting: Student

## 2023-04-24 LAB — HCV INTERPRETATION

## 2023-04-24 LAB — HCV AB W REFLEX TO QUANT PCR: HCV Ab: NONREACTIVE

## 2023-04-24 NOTE — Assessment & Plan Note (Signed)
 Tdap and Hep C screening today.

## 2023-04-24 NOTE — Progress Notes (Signed)
 Internal Medicine Clinic Attending  Case discussed with the resident at the time of the visit.  We reviewed the resident's history and exam and pertinent patient test results.  I agree with the assessment, diagnosis, and plan of care documented in the resident's note.   Her mood is depressed and anxious.  I agree with plan to continue Lexapro, and add Buspar and BH referral. Please repeat PHQ9 & GAD7 at visit in April.

## 2023-04-25 ENCOUNTER — Encounter: Payer: Self-pay | Admitting: Student

## 2023-05-07 ENCOUNTER — Other Ambulatory Visit (HOSPITAL_COMMUNITY)
Admission: RE | Admit: 2023-05-07 | Discharge: 2023-05-07 | Disposition: A | Discharge status: Home / Self Care | Source: Ambulatory Visit | Attending: Adult Health | Admitting: Adult Health

## 2023-05-07 ENCOUNTER — Encounter: Payer: Self-pay | Admitting: Adult Health

## 2023-05-07 ENCOUNTER — Ambulatory Visit: Payer: Medicaid Other | Admitting: Adult Health

## 2023-05-07 VITALS — BP 131/85 | HR 73 | Ht 63.0 in | Wt 143.0 lb

## 2023-05-07 DIAGNOSIS — R6882 Decreased libido: Secondary | ICD-10-CM

## 2023-05-07 DIAGNOSIS — N841 Polyp of cervix uteri: Secondary | ICD-10-CM | POA: Diagnosis not present

## 2023-05-07 DIAGNOSIS — Z1331 Encounter for screening for depression: Secondary | ICD-10-CM | POA: Diagnosis not present

## 2023-05-07 DIAGNOSIS — Z01419 Encounter for gynecological examination (general) (routine) without abnormal findings: Secondary | ICD-10-CM | POA: Diagnosis not present

## 2023-05-07 NOTE — Progress Notes (Signed)
 Patient ID: Mary Meyer, female   DOB: August 16, 1990, 33 y.o.   MRN: [id number] History of Present Illness: Mary Meyer is a 33 year old white female, single, [id number] in for a well woman gyn exam.     Component Value Date/Time   DIAGPAP  04/18/2022 0938    - Negative for intraepithelial lesion or malignancy (NILM)   DIAGPAP - Non-diagnostic (A) 04/04/2022 1140   DIAGPAP  03/10/2019 1123    - Negative for intraepithelial lesion or malignancy (NILM)   HPVHIGH Negative 04/18/2022 0938   HPVHIGH Other 04/04/2022 1140   HPVHIGH Negative 03/10/2019 1123   ADEQPAP  04/18/2022 0938    Satisfactory for evaluation; transformation zone component PRESENT.   ADEQPAP  04/04/2022 1140    UNSATISFACTORY for evaluation due to extremely scant cellularity. The   [facility]  04/04/2022 1140    specimen is processed and examined microscopically, but is found to be   ADEQPAP  04/04/2022 1140    unsatisfactory for evaluation of an epithelial abnormality. Repeat study   ADEQPAP recommended. 04/04/2022 1140   PCP is Dr Carlynn Purl    Current Medications, Allergies, Past Medical History, Past Surgical History, Family History and Social History were reviewed in [employer] [facility] electronic medical record.     Review of Systems: Patient denies any headaches, hearing loss, fatigue, blurred vision, shortness of breath, chest pain, abdominal pain, problems with bowel movements, urination, or intercourse. No joint pain or mood swings.  Period irregular Has decreased sex drive Has spotted after sex   Physical Exam:BP 131/85 (BP Location: Left Arm, Patient Position: Sitting, Cuff Size: Normal)   Pulse 73   Ht 5\' 3"  (1.6 m)   Wt 143 lb (64.9 kg)   LMP 04/30/2023 (Approximate)   BMI 25.33 kg/m  consent signed for polyp removal.  General:  Well developed, well nourished, no acute distress Skin:  Warm and dry Neck:  Midline trachea, normal thyroid, good ROM, no lymphadenopathy Lungs; Clear to auscultation  bilaterally Breast:  No dominant palpable mass, retraction, or nipple discharge Cardiovascular: Regular rate and rhythm Abdomen:  Soft, non tender, no hepatosplenomegaly Pelvic:  External genitalia is normal in appearance, no lesions.  The vagina is normal in appearance. Urethra has no lesions or masses. The cervix has cervical polyp that is friable and tender, when grasped with forceps and removed.  Uterus is felt to be normal size, shape, and contour.  No adnexal masses or tenderness noted.Bladder is non tender, no masses felt. Extremities/musculoskeletal:  No swelling or varicosities noted, no clubbing or cyanosis Psych:  No mood changes, alert and cooperative,seems happy AA is 4 Fall risk is low    05/07/2023    3:35 PM 04/24/2023    3:27 PM 11/14/2022    3:31 PM  Depression screen PHQ 2/9  Decreased Interest 3 2 3   Down, Depressed, Hopeless 3 3 3   PHQ - 2 Score 6 5 6   Altered sleeping 3 3 3   Tired, decreased energy 2 2 2   Change in appetite 0 0 1  Feeling bad or failure about yourself  1 1 1   Trouble concentrating 2 1 2   Moving slowly or fidgety/restless 0 0 0  Suicidal thoughts 0 0 0  PHQ-9 Score 14 12 15   Difficult doing work/chores  Somewhat difficult Somewhat difficult   On meds     05/07/2023    3:36 PM 04/24/2023    3:27 PM 11/14/2022    3:31 PM 04/04/2022   11:35 AM  GAD 7 :  Generalized Anxiety Score  Nervous, Anxious, on Edge 3 1 2 2   Control/stop worrying 3 3 3 3   Worry too much - different things 3 3 3 1   Trouble relaxing 2 2 2 3   Restless 2 1 0 1  Easily annoyed or irritable 3 2 3 1   Afraid - awful might happen 1 1 0 0  Total GAD 7 Score 17 13 13 11   Anxiety Difficulty  Somewhat difficult Somewhat difficult       Upstream - 05/07/23 1535       Pregnancy Intention Screening   Does the patient want to become pregnant in the next year? No    Does the patient's partner want to become pregnant in the next year? No    Would the patient like to discuss  contraceptive options today? No      Contraception Wrap Up   Current Method Oral Contraceptive    End Method Oral Contraceptive    Contraception Counseling Provided Yes             CO exam with Rodney Cruise NP student   Impression and plan: 1. Encounter for well woman exam with routine gynecological exam (Primary) Pap in 2027 Physical in 1 year  Labs with PCP  2. Cervical polyp Polyp removed and sent to pathology - Surgical pathology( [place] [facility]/ [facility]) Recheck in 1 week  No sex for 3 days   3. Decreased sex drive Try increasing frequency of sex and foreplay

## 2023-05-09 LAB — SURGICAL PATHOLOGY

## 2023-05-14 ENCOUNTER — Ambulatory Visit: Admitting: Adult Health

## 2023-05-14 ENCOUNTER — Encounter: Payer: Self-pay | Admitting: Adult Health

## 2023-05-14 VITALS — BP 119/74 | HR 72 | Ht 63.0 in | Wt 143.0 lb

## 2023-05-14 DIAGNOSIS — N841 Polyp of cervix uteri: Secondary | ICD-10-CM | POA: Diagnosis not present

## 2023-05-14 DIAGNOSIS — N930 Postcoital and contact bleeding: Secondary | ICD-10-CM

## 2023-05-14 NOTE — Progress Notes (Signed)
  Subjective:     Patient ID: Mary Meyer, female   DOB: 17-Nov-1990, 33 y.o.   MRN: [id number]  HPI Christean is a 33 year old white female, single, [id number] in for follow up on having cervical polyp removed 05/07/23, was painful and had bleeding. The pathology was negative for glandular hyperplasia and malignancy, minute fragmane tof benign endocervical polyp.     Component Value Date/Time   DIAGPAP  04/18/2022 0938    - Negative for intraepithelial lesion or malignancy (NILM)   DIAGPAP - Non-diagnostic (A) 04/04/2022 1140   DIAGPAP  03/10/2019 1123    - Negative for intraepithelial lesion or malignancy (NILM)   HPVHIGH Negative 04/18/2022 0938   HPVHIGH Other 04/04/2022 1140   HPVHIGH Negative 03/10/2019 1123   ADEQPAP  04/18/2022 0938    Satisfactory for evaluation; transformation zone component PRESENT.   ADEQPAP  04/04/2022 1140    UNSATISFACTORY for evaluation due to extremely scant cellularity. The   [facility]  04/04/2022 1140    specimen is processed and examined microscopically, but is found to be   ADEQPAP  04/04/2022 1140    unsatisfactory for evaluation of an epithelial abnormality. Repeat study   ADEQPAP recommended. 04/04/2022 1140   PCP is Dr Carlynn Purl  Review of Systems Bleeding stopped But has had bleeding after sex Reviewed past medical,surgical, social and family history. Reviewed medications and allergies.     Objective:   Physical Exam BP 119/74 (BP Location: Left Arm, Patient Position: Sitting, Cuff Size: Normal)   Pulse 72   Ht 5\' 3"  (1.6 m)   Wt 143 lb (64.9 kg)   LMP 04/30/2023 (Approximate)   BMI 25.33 kg/m     Skin warm and dry.Pelvic: external genitalia is normal in appearance no lesions, vagina: pink,urethra has no lesions or masses noted, cervix:bulbous, still has cervical polyp at os and when touched with Q tip had some bleeding. Dr Charlotta Newton in for co exam.  Upstream - 05/14/23 1610       Pregnancy Intention Screening   Does the patient want to  become pregnant in the next year? No    Does the patient's partner want to become pregnant in the next year? No    Would the patient like to discuss contraceptive options today? No      Contraception Wrap Up   Current Method Oral Contraceptive    End Method Oral Contraceptive    Contraception Counseling Provided Yes            Examination chaperoned by Marylynn Pearson NP student.  Assessment:     1. Cervical polyp (Primary) Dr Charlotta Newton to scheduled surgical removal - Ambulatory Referral For Surgery Scheduling  2. Postcoital and contact bleeding      Plan:     Follow up prn

## 2023-05-16 ENCOUNTER — Encounter: Payer: Self-pay | Admitting: Obstetrics & Gynecology

## 2023-05-21 ENCOUNTER — Other Ambulatory Visit: Payer: Self-pay | Admitting: Internal Medicine

## 2023-05-21 DIAGNOSIS — F329 Major depressive disorder, single episode, unspecified: Secondary | ICD-10-CM

## 2023-05-27 ENCOUNTER — Encounter: Payer: Self-pay | Admitting: Licensed Clinical Social Worker

## 2023-06-13 NOTE — Patient Instructions (Signed)
 Mary Meyer  06/13/2023     @PREFPERIOPPHARMACY @   Your procedure is scheduled on  06/18/2023.   Report to [facility] at  [phone] A.M.   Call this number if you have problems the morning of surgery:  [phone]  If you experience any cold or flu symptoms such as cough, fever, chills, shortness of breath, etc. between now and your scheduled surgery, please notify us at the above number.   Remember:  Do not eat after midnight.    You may drink clear liquids until  0445 am on 06/18/2023.    Clear liquids allowed are:                    Water, Juice (No red color; non-citric and without pulp; diabetics please choose diet or no sugar options), Carbonated beverages (diabetics please choose diet or no sugar options), Clear Tea (No creamer, milk, or cream, including half & half and powdered creamer), Black Coffee Only (No creamer, milk or cream, including half & half and powdered creamer), and Plain Jell-O Only (No red color; diabetics please choose no sugar options)    Take these medicines the morning of surgery with A SIP OF WATER                                        buspirone, escitalopram.    Do not wear jewelry, make-up or nail polish, including gel polish,  artificial nails, or any other type of covering on natural nails (fingers and  toes).  Do not wear lotions, powders, or perfumes, or deodorant.  Do not shave 48 hours prior to surgery.  Men may shave face and neck.  Do not bring valuables to the hospital.  [facility] is not responsible for any belongings or valuables.  Contacts, dentures or bridgework may not be worn into surgery.  Leave your suitcase in the car.  After surgery it may be brought to your room.  For patients admitted to the hospital, discharge time will be determined by your treatment team.  Patients discharged the day of surgery will not be allowed to drive home and must have someone with them for 24 hours.    Special instructions:    DO NOT smoke tobacco or vape for 24 hours before your procedure.  Please read over the following fact sheets that you were given. Coughing and Deep Breathing, Surgical Site Infection Prevention, Anesthesia Post-op Instructions, and Care and Recovery After Surgery      Dilation and Curettage or Vacuum Curettage, Care After The following information offers guidance on how to care for yourself after your procedure. Your doctor may also give you more specific instructions. If you have problems or questions, contact your doctor. What can I expect after the procedure? After the procedure, it is common to have: Mild pain or cramps. Some bleeding or spotting from the vagina. These may last for up to 2 weeks. Follow these instructions at home: Medicines Take over-the-counter and prescription medicines only as told by your doctor. If told, take steps to prevent problems with pooping (constipation). You may need to: Drink enough fluid to keep your pee (urine) pale yellow. Take medicines. You will be told what medicines to take. Eat foods that are high in fiber. These include beans, whole grains, and fresh fruits and vegetables. Limit foods that are high  in fat and sugar. These include fried or sweet foods. Ask your doctor if you should avoid driving or using machines while you are taking your medicine. Activity  If you were given a medicine to help you relax (sedative) during your procedure, it can affect you for many hours. Do not drive or use machinery until your doctor says that it is safe. Rest as told by your doctor. Get up to take short walks every 1-2 hours. Ask for help if you feel weak or unsteady. Do not lift anything that is heavier than 10 lb (4.5 kg), or the limit that you are told. Return to your normal activities when your doctor says that it is safe. Lifestyle For at least 2 weeks, or as long as told by your doctor: Do not douche. Do not use tampons. Do not have sex. General  instructions Do not take baths, swim, or use a hot tub. Ask your doctor if you may take showers. Do not smoke or use any products that contain nicotine or tobacco. These can delay healing. If you need help quitting, ask your doctor. Wear compression stockings as told by your doctor. It is up to you to get the results of your procedure. Ask how to get your results when they are ready. Keep all follow-up visits. Contact a doctor if: You have very bad cramps that get worse or do not get better with medicine. You have very bad pain in your belly (abdomen). You cannot drink fluids without vomiting. You have pain in the area just above your thighs. You have fluid from your vagina that smells bad. You have a rash. Get help right away if: You are bleeding a lot from your vagina. This means soaking more than one sanitary pad in 1 hour, and this happens for 2 hours in a row. You have a fever that is above 100.81F (38C). Your belly feels very tender or hard. You have chest pain. You have trouble breathing. You feel dizzy or light-headed. You faint. You have pain in your neck or shoulder area. These symptoms may be an emergency. Get help right away. Call your local emergency services (911 in the [place] Do not wait to see if the symptoms will go away. Do not drive yourself to the hospital. Summary After your procedure, it is common to have pain or cramping. It is also common to have bleeding or spotting from your vagina. Rest as told. Get up to take short walks every 1-2 hours. Do not lift anything that is heavier than 10 lb (4.5 kg), or the limit that you are told. Get help right away if you have problems from the procedure. Ask your doctor what problems to watch for. This information is not intended to replace advice given to you by your health care provider. Make sure you discuss any questions you have with your health care provider. Document Revised: 02/01/2020 Document Reviewed:  02/03/2020 Elsevier Patient Education  2024 Elsevier Inc.General Anesthesia, Adult, Care After The following information offers guidance on how to care for yourself after your procedure. Your health care provider may also give you more specific instructions. If you have problems or questions, contact your health care provider. What can I expect after the procedure? After the procedure, it is common for people to: Have pain or discomfort at the IV site. Have nausea or vomiting. Have a sore throat or hoarseness. Have trouble concentrating. Feel cold or chills. Feel weak, sleepy, or tired (fatigue). Have soreness and body aches.  These can affect parts of the body that were not involved in surgery. Follow these instructions at home: For the time period you were told by your health care provider:  Rest. Do not participate in activities where you could fall or become injured. Do not drive or use machinery. Do not drink alcohol. Do not take sleeping pills or medicines that cause drowsiness. Do not make important decisions or sign legal documents. Do not take care of children on your own. General instructions Drink enough fluid to keep your urine pale yellow. If you have sleep apnea, surgery and certain medicines can increase your risk for breathing problems. Follow instructions from your health care provider about wearing your sleep device: Anytime you are sleeping, including during daytime naps. While taking prescription pain medicines, sleeping medicines, or medicines that make you drowsy. Return to your normal activities as told by your health care provider. Ask your health care provider what activities are safe for you. Take over-the-counter and prescription medicines only as told by your health care provider. Do not use any products that contain nicotine or tobacco. These products include cigarettes, chewing tobacco, and vaping devices, such as e-cigarettes. These can delay incision  healing after surgery. If you need help quitting, ask your health care provider. Contact a health care provider if: You have nausea or vomiting that does not get better with medicine. You vomit every time you eat or drink. You have pain that does not get better with medicine. You cannot urinate or have bloody urine. You develop a skin rash. You have a fever. Get help right away if: You have trouble breathing. You have chest pain. You vomit blood. These symptoms may be an emergency. Get help right away. Call 911. Do not wait to see if the symptoms will go away. Do not drive yourself to the hospital. Summary After the procedure, it is common to have a sore throat, hoarseness, nausea, vomiting, or to feel weak, sleepy, or fatigue. For the time period you were told by your health care provider, do not drive or use machinery. Get help right away if you have difficulty breathing, have chest pain, or vomit blood. These symptoms may be an emergency. This information is not intended to replace advice given to you by your health care provider. Make sure you discuss any questions you have with your health care provider. Document Revised: 05/12/2021 Document Reviewed: 05/12/2021 Elsevier Patient Education  2024 Elsevier Inc.How to Use Chlorhexidine at Home in the Shower Chlorhexidine gluconate (CHG) is a germ-killing (antiseptic) wash that's used to clean the skin. It can get rid of the germs that normally live on the skin and can keep them away for about 24 hours. If you're having surgery, you may be told to shower with CHG at home the night before surgery. This can help lower your risk for infection. To use CHG wash in the shower, follow the steps below. Supplies needed: CHG body wash. Clean washcloth. Clean towel. How to use CHG in the shower Follow these steps unless you're told to use CHG in a different way: Start the shower. Use your normal soap and shampoo to wash your face and  hair. Turn off the shower or move out of the shower stream. Pour CHG onto a clean washcloth. Do not use any type of brush or rough sponge. Start at your neck, washing your body down to your toes. Make sure you: Wash the part of your body where the surgery will be done for at  least 1 minute. Do not scrub. Do not use CHG on your head or face unless your health care provider tells you to. If it gets into your ears or eyes, rinse them well with water. Do not wash your genitals with CHG. Wash your back and under your arms. Make sure to wash skin folds. Let the CHG sit on your skin for 1-2 minutes or as long as told. Rinse your entire body in the shower, including all body creases and folds. Turn off the shower. Dry off with a clean towel. Do not put anything on your skin afterward, such as powder, lotion, or perfume. Put on clean clothes or pajamas. If it's the night before surgery, sleep in clean sheets. General tips Use CHG only as told, and follow the instructions on the label. Use the full amount of CHG as told. This is often one bottle. Do not smoke and stay away from flames after using CHG. Your skin may feel sticky after using CHG. This is normal. The sticky feeling will go away as the CHG dries. Do not use CHG: If you have a chlorhexidine allergy or have reacted to chlorhexidine in the past. On open wounds or areas of skin that have broken skin, cuts, or scrapes. On babies younger than 34 months of age. Contact a health care provider if: You have questions about using CHG. Your skin gets irritated or itchy. You have a rash after using CHG. You swallow any CHG. Call your local poison control center [phone] in the [place] Your eyes itch badly, or they become very red or swollen. Your hearing changes. You have trouble seeing. If you can't reach your provider, go to an urgent care or emergency room. Do not drive yourself. Get help right away if: You have swelling or tingling in  your mouth or throat. You make high-pitched whistling sounds when you breathe, most often when you breathe out (wheeze). You have trouble breathing. These symptoms may be an emergency. Call 911 right away. Do not wait to see if the symptoms will go away. Do not drive yourself to the hospital. This information is not intended to replace advice given to you by your health care provider. Make sure you discuss any questions you have with your health care provider. Document Revised: 08/28/2022 Document Reviewed: 08/24/2021 Elsevier Patient Education  2024 [employer].

## 2023-06-17 ENCOUNTER — Encounter (HOSPITAL_COMMUNITY): Payer: Self-pay

## 2023-06-17 ENCOUNTER — Encounter: Payer: Self-pay | Admitting: Obstetrics & Gynecology

## 2023-06-17 ENCOUNTER — Encounter (HOSPITAL_COMMUNITY)
Admission: RE | Admit: 2023-06-17 | Discharge: 2023-06-17 | Disposition: A | Discharge status: Home / Self Care | Source: Ambulatory Visit | Attending: Obstetrics & Gynecology | Admitting: Obstetrics & Gynecology

## 2023-06-17 VITALS — BP 116/88 | HR 68 | Temp 98.0°F | Resp 16 | Ht 63.0 in | Wt 143.1 lb

## 2023-06-17 DIAGNOSIS — I4719 Other supraventricular tachycardia: Secondary | ICD-10-CM | POA: Insufficient documentation

## 2023-06-17 DIAGNOSIS — Z01818 Encounter for other preprocedural examination: Secondary | ICD-10-CM | POA: Diagnosis not present

## 2023-06-17 DIAGNOSIS — F1011 Alcohol abuse, in remission: Secondary | ICD-10-CM | POA: Diagnosis not present

## 2023-06-17 DIAGNOSIS — Z01810 Encounter for preprocedural cardiovascular examination: Secondary | ICD-10-CM | POA: Diagnosis present

## 2023-06-17 DIAGNOSIS — Z01812 Encounter for preprocedural laboratory examination: Secondary | ICD-10-CM | POA: Diagnosis present

## 2023-06-17 DIAGNOSIS — Z720 Tobacco use: Secondary | ICD-10-CM | POA: Diagnosis not present

## 2023-06-17 DIAGNOSIS — N841 Polyp of cervix uteri: Secondary | ICD-10-CM | POA: Diagnosis not present

## 2023-06-17 HISTORY — DX: Cardiac arrhythmia, unspecified: I49.9

## 2023-06-17 LAB — COMPREHENSIVE METABOLIC PANEL WITH GFR
ALT: 17 U/L (ref 0–44)
AST: 17 U/L (ref 15–41)
Albumin: 3.8 g/dL (ref 3.5–5.0)
Alkaline Phosphatase: 42 U/L (ref 38–126)
Anion gap: 9 (ref 5–15)
BUN: 15 mg/dL (ref 6–20)
CO2: 21 mmol/L — ABNORMAL LOW (ref 22–32)
Calcium: 8.8 mg/dL — ABNORMAL LOW (ref 8.9–10.3)
Chloride: 105 mmol/L (ref 98–111)
Creatinine, Ser: 0.76 mg/dL (ref 0.44–1.00)
GFR, Estimated: >60 mL/min (ref >60)
Glucose, Bld: 72 mg/dL (ref 70–99)
Potassium: 3.8 mmol/L (ref 3.5–5.1)
Sodium: 135 mmol/L (ref 135–145)
Total Bilirubin: 0.8 mg/dL (ref 0.0–1.2)
Total Protein: 7.3 g/dL (ref 6.5–8.1)

## 2023-06-17 LAB — PREGNANCY, URINE: Preg Test, Ur: NEGATIVE

## 2023-06-17 LAB — CBC
HCT: 41.1 % (ref 36.0–46.0)
Hemoglobin: 13.8 g/dL (ref 12.0–15.0)
MCH: 31.7 pg (ref 26.0–34.0)
MCHC: 33.6 g/dL (ref 30.0–36.0)
MCV: 94.5 fL (ref 80.0–100.0)
Platelets: 234 10*3/uL (ref 150–400)
RBC: 4.35 MIL/uL (ref 3.87–5.11)
RDW: 12.5 % (ref 11.5–15.5)
WBC: 5.4 10*3/uL (ref 4.0–10.5)
nRBC: 0 % (ref 0.0–0.2)

## 2023-06-17 NOTE — H&P (Addendum)
 [facility] Attending History and Physical  Mary Meyer is a 33 y.o. [id number] who presents for scheduled polyp removal and possible hysteroscopy, D&C.  In review in office cervical polyp removal was attempted, but due to discomfort and bleeding the procedure was incomplete.  Denies any abnormal vaginal discharge, fevers, chills, sweats, dysuria, nausea, vomiting, other GI or GU symptoms or other general symptoms.  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  Pt states she had a stent placed during the cath/ablation, but records don't reflect this.  Cards appt 04/26/14 @ [facility]: EKG & Holter monitor all returned normal per pt, no further f/u needed per cards    Urinary tract infection without hematuria 07/10/2019   Vaginal Pap smear, abnormal    Past Surgical History:  Procedure Laterality Date   CARDIAC CATHETERIZATION  2009   states due to tacycardia, had alblation   CARDIAC CATHETERIZATION  2009   CESAREAN SECTION N/A 10/03/2014   Procedure: CESAREAN SECTION;  Surgeon: Tresia Fruit, MD;  Location: [facility];  Service: Obstetrics;  Laterality: N/A;   OB History  Gravida Para Term Preterm AB Living  4 1 1  3 1   SAB IAB Ectopic Multiple Live Births  2  1 0 1    # Outcome Date GA Lbr Len/2nd Weight Sex Type Anes PTL Lv  4 Ectopic 2024          3 SAB 03/2016          2 Term 10/03/14 [redacted]w[redacted]d  3035 g M CS-LTranv Other N LIV     Complications: Umbilical cord prolapse  1 SAB           Patient denies any other pertinent  gynecologic issues.  No current facility-administered medications on file prior to encounter.   Current Outpatient Medications on File Prior to Encounter  Medication Sig Dispense Refill   busPIRone  (BUSPAR ) 7.5 MG tablet Take 1 tablet (7.5 mg total) by mouth 2 (two) times daily. 180 tablet 3   escitalopram  (LEXAPRO ) 20 MG tablet Take 1 tablet (20 mg total) by mouth daily. 90 tablet 3   Melatonin (MELATONIN KIDS) 1 MG CHEW Chew 1 mg by mouth at bedtime as needed (sleep).     norethindrone-ethinyl estradiol -FE (LOESTRIN FE) 1-20 MG-MCG tablet Take 1 tablet by mouth in the morning.     No Known Allergies  Social History:   reports that she has quit smoking. She has never been exposed to tobacco smoke. She has never used smokeless tobacco. She reports current alcohol use. She reports that she does not use drugs. Family History  Problem Relation Age of Onset   Thyroid  disease Maternal Aunt    Heart Problems Maternal Aunt    Cancer Maternal Grandmother    Other Maternal Grandmother        brain tumor   Cancer Maternal Grandfather     Review of Systems: Pertinent items noted in HPI and remainder of comprehensive ROS otherwise negative.  PHYSICAL EXAM: There were no vitals  taken for this visit. *** CONSTITUTIONAL: Well-developed, well-nourished female in no acute distress.  SKIN: Skin is warm and dry. No rash noted. Not diaphoretic. No erythema. No pallor. NEUROLOGIC: Alert and oriented to person, place, and time. Normal reflexes, muscle tone coordination. No cranial nerve deficit noted. PSYCHIATRIC: Normal mood and affect. Normal behavior. Normal judgment and thought content. CARDIOVASCULAR: Normal heart rate noted, regular rhythm RESPIRATORY: Effort and breath sounds normal, no problems with respiration noted ABDOMEN: Soft, nontender, nondistended. PELVIC: deferred MUSCULOSKELETAL: no calf tenderness bilaterally EXT: no edema bilaterally, normal pulses    Assessment: Cervical  polyp   Plan: Cervical polyp removal, possible hysteroscopy, D&C -NPO -LR @ 125cc/hr -SCDs to OR -Risk/benefits and alternatives reviewed with the patient including but not limited to risk of bleeding, infection and injury such as uterine perforation requiring further surgical intervention due to injury to surrounding organs.  Questions and concerns were addressed and pt desires to proceed  Barby Colvard, DO Attending Obstetrician & Gynecologist, [facility] for [facility], [facility]

## 2023-06-18 ENCOUNTER — Other Ambulatory Visit: Payer: Self-pay

## 2023-06-18 ENCOUNTER — Ambulatory Visit (HOSPITAL_COMMUNITY): Admitting: Anesthesiology

## 2023-06-18 ENCOUNTER — Encounter (HOSPITAL_COMMUNITY)
Admission: RE | Disposition: A | Discharge status: Home / Self Care | Payer: Self-pay | Source: Home / Self Care | Attending: Obstetrics & Gynecology

## 2023-06-18 ENCOUNTER — Ambulatory Visit (HOSPITAL_BASED_OUTPATIENT_CLINIC_OR_DEPARTMENT_OTHER): Admitting: Anesthesiology

## 2023-06-18 ENCOUNTER — Ambulatory Visit (HOSPITAL_COMMUNITY)
Admission: RE | Admit: 2023-06-18 | Discharge: 2023-06-18 | Disposition: A | Discharge status: Home / Self Care | Attending: Obstetrics & Gynecology | Admitting: Obstetrics & Gynecology

## 2023-06-18 ENCOUNTER — Encounter (HOSPITAL_COMMUNITY): Payer: Self-pay | Admitting: Obstetrics & Gynecology

## 2023-06-18 DIAGNOSIS — N938 Other specified abnormal uterine and vaginal bleeding: Secondary | ICD-10-CM | POA: Diagnosis not present

## 2023-06-18 DIAGNOSIS — N841 Polyp of cervix uteri: Secondary | ICD-10-CM | POA: Insufficient documentation

## 2023-06-18 DIAGNOSIS — I10 Essential (primary) hypertension: Secondary | ICD-10-CM | POA: Diagnosis not present

## 2023-06-18 DIAGNOSIS — J45909 Unspecified asthma, uncomplicated: Secondary | ICD-10-CM | POA: Insufficient documentation

## 2023-06-18 DIAGNOSIS — N72 Inflammatory disease of cervix uteri: Secondary | ICD-10-CM | POA: Diagnosis not present

## 2023-06-18 DIAGNOSIS — Z01818 Encounter for other preprocedural examination: Secondary | ICD-10-CM

## 2023-06-18 DIAGNOSIS — Z87891 Personal history of nicotine dependence: Secondary | ICD-10-CM | POA: Diagnosis not present

## 2023-06-18 DIAGNOSIS — N888 Other specified noninflammatory disorders of cervix uteri: Secondary | ICD-10-CM

## 2023-06-18 HISTORY — PX: CERVICAL POLYPECTOMY: SHX88

## 2023-06-18 SURGERY — POLYPECTOMY, CERVIX
Anesthesia: General | Site: Vagina

## 2023-06-18 MED ORDER — PHENYLEPHRINE 80 MCG/ML (10ML) SYRINGE FOR IV PUSH (FOR BLOOD PRESSURE SUPPORT)
PREFILLED_SYRINGE | INTRAVENOUS | Status: AC
  Filled 2023-06-18: qty 10

## 2023-06-18 MED ORDER — FENTANYL CITRATE (PF) 100 MCG/2ML IJ SOLN
INTRAMUSCULAR | Status: AC
  Filled 2023-06-18: qty 2

## 2023-06-18 MED ORDER — PROPOFOL 10 MG/ML IV BOLUS
INTRAVENOUS | Status: DC | PRN
Start: 2023-06-18 — End: 2023-06-18
  Administered 2023-06-18: 250 mg via INTRAVENOUS
  Administered 2023-06-18: 50 mg via INTRAVENOUS

## 2023-06-18 MED ORDER — ORAL CARE MOUTH RINSE: 15.0000 mL | Freq: Once | OROMUCOSAL | Status: AC

## 2023-06-18 MED ORDER — KETOROLAC TROMETHAMINE 30 MG/ML IJ SOLN: 30.0000 mg | INTRAMUSCULAR | Status: AC

## 2023-06-18 MED ORDER — DEXMEDETOMIDINE HCL IN NACL 80 MCG/20ML IV SOLN
INTRAVENOUS | Status: DC | PRN
  Administered 2023-06-18: 12 ug via INTRAVENOUS

## 2023-06-18 MED ORDER — LIDOCAINE HCL (PF) 2 % IJ SOLN
INTRAMUSCULAR | Status: AC
  Filled 2023-06-18: qty 5

## 2023-06-18 MED ORDER — PROPOFOL 10 MG/ML IV BOLUS
INTRAVENOUS | Status: AC
  Filled 2023-06-18: qty 20

## 2023-06-18 MED ORDER — SODIUM CHLORIDE 0.9 % IR SOLN
Status: DC | PRN
  Administered 2023-06-18: 1000 mL

## 2023-06-18 MED ORDER — EPHEDRINE 5 MG/ML INJ
INTRAVENOUS | Status: AC
  Filled 2023-06-18: qty 5

## 2023-06-18 MED ORDER — KETOROLAC TROMETHAMINE 30 MG/ML IJ SOLN
INTRAMUSCULAR | Status: AC
  Administered 2023-06-18: 30 mg via INTRAVENOUS
  Filled 2023-06-18: qty 1

## 2023-06-18 MED ORDER — ONDANSETRON HCL 4 MG/2ML IJ SOLN
INTRAMUSCULAR | Status: DC | PRN
  Administered 2023-06-18: 4 mg via INTRAVENOUS

## 2023-06-18 MED ORDER — POVIDONE-IODINE 10 % EX SWAB
2.0000 | Freq: Once | CUTANEOUS | Status: AC
  Administered 2023-06-18: 2 via TOPICAL

## 2023-06-18 MED ORDER — EPHEDRINE SULFATE-NACL 50-0.9 MG/10ML-% IV SOSY
PREFILLED_SYRINGE | INTRAVENOUS | Status: DC | PRN
  Administered 2023-06-18: 10 mg via INTRAVENOUS

## 2023-06-18 MED ORDER — DEXAMETHASONE SODIUM PHOSPHATE 10 MG/ML IJ SOLN
INTRAMUSCULAR | Status: DC | PRN
  Administered 2023-06-18: 8 mg via INTRAVENOUS

## 2023-06-18 MED ORDER — FENTANYL CITRATE PF 50 MCG/ML IJ SOSY: 25.0000 ug | PREFILLED_SYRINGE | INTRAMUSCULAR | Status: DC | PRN

## 2023-06-18 MED ORDER — MONSELS FERRIC SUBSULFATE EX SOLN
CUTANEOUS | Status: DC | PRN
  Administered 2023-06-18: 1 via TOPICAL

## 2023-06-18 MED ORDER — ONDANSETRON HCL 4 MG/2ML IJ SOLN: 4.0000 mg | Freq: Once | INTRAMUSCULAR | Status: DC | PRN

## 2023-06-18 MED ORDER — FENTANYL CITRATE (PF) 100 MCG/2ML IJ SOLN
INTRAMUSCULAR | Status: DC | PRN
  Administered 2023-06-18 (×4): 25 ug via INTRAVENOUS

## 2023-06-18 MED ORDER — MIDAZOLAM HCL 2 MG/2ML IJ SOLN
INTRAMUSCULAR | Status: AC
  Filled 2023-06-18: qty 2

## 2023-06-18 MED ORDER — PHENYLEPHRINE 80 MCG/ML (10ML) SYRINGE FOR IV PUSH (FOR BLOOD PRESSURE SUPPORT)
PREFILLED_SYRINGE | INTRAVENOUS | Status: DC | PRN
  Administered 2023-06-18: 80 ug via INTRAVENOUS

## 2023-06-18 MED ORDER — LIDOCAINE HCL (PF) 2 % IJ SOLN
INTRAMUSCULAR | Status: DC | PRN
  Administered 2023-06-18: 100 mg via INTRADERMAL

## 2023-06-18 MED ORDER — CHLORHEXIDINE GLUCONATE 0.12 % MT SOLN
15.0000 mL | Freq: Once | OROMUCOSAL | Status: AC
  Administered 2023-06-18: 15 mL via OROMUCOSAL

## 2023-06-18 MED ORDER — MONSELS FERRIC SUBSULFATE EX SOLN
CUTANEOUS | Status: AC
  Filled 2023-06-18: qty 8

## 2023-06-18 MED ORDER — OXYCODONE HCL 5 MG/5ML PO SOLN: 5.0000 mg | Freq: Once | ORAL | Status: DC | PRN

## 2023-06-18 MED ORDER — LIDOCAINE-EPINEPHRINE 0.5 %-1:200000 IJ SOLN
INTRAMUSCULAR | Status: DC | PRN
  Administered 2023-06-18: 20 mL
  Administered 2023-06-18: 10 mL

## 2023-06-18 MED ORDER — LACTATED RINGERS IV SOLN: INTRAVENOUS | Status: DC

## 2023-06-18 MED ORDER — OXYCODONE HCL 5 MG PO TABS: 5.0000 mg | ORAL_TABLET | Freq: Once | ORAL | Status: DC | PRN

## 2023-06-18 MED ORDER — MIDAZOLAM HCL 2 MG/2ML IJ SOLN
INTRAMUSCULAR | Status: DC | PRN
  Administered 2023-06-18: 2 mg via INTRAVENOUS

## 2023-06-18 SURGICAL SUPPLY — 23 items
COVER LIGHT HANDLE STERIS (MISCELLANEOUS) ×6 IMPLANT
ELECT BALL LEEP 5MM RED (ELECTRODE) ×1 IMPLANT
ELECTRODE LOOP LP RND 10X10YLW (CUTTING LOOP) ×1 IMPLANT
GAUZE 4X4 16PLY ~~LOC~~+RFID DBL (SPONGE) ×4 IMPLANT
GLOVE BIO SURGEON STRL SZ 6.5 (GLOVE) ×2 IMPLANT
GLOVE BIOGEL PI IND STRL 7.0 (GLOVE) ×6 IMPLANT
GOWN STRL REUS W/ TWL LRG LVL3 (GOWN DISPOSABLE) ×2 IMPLANT
GOWN STRL REUS W/TWL LRG LVL3 (GOWN DISPOSABLE) ×2 IMPLANT
KIT TURNOVER CYSTO (KITS) ×2 IMPLANT
NDL HYPO 18GX1.5 BLUNT FILL (NEEDLE) ×1 IMPLANT
NEEDLE HYPO 18GX1.5 BLUNT FILL (NEEDLE) ×2 IMPLANT
NS IRRIG 1000ML POUR BTL (IV SOLUTION) ×2 IMPLANT
PACK PERI GYN (CUSTOM PROCEDURE TRAY) ×2 IMPLANT
PAD ARMBOARD POSITIONER FOAM (MISCELLANEOUS) ×2 IMPLANT
PAD TELFA 3X4 1S STER (GAUZE/BANDAGES/DRESSINGS) ×2 IMPLANT
POSITIONER HEAD 8X9X4 ADT (SOFTGOODS) ×2 IMPLANT
SEAL ROD LENS SCOPE MYOSURE (ABLATOR) ×2 IMPLANT
SET BASIN LINEN APH (SET/KITS/TRAYS/PACK) ×2 IMPLANT
SOL .9 NS 3000ML IRR UROMATIC (IV SOLUTION) ×2 IMPLANT
SOL PREP POV-IOD 4OZ 10% (MISCELLANEOUS) ×2 IMPLANT
SYR 30ML LL (SYRINGE) ×2 IMPLANT
SYR CONTROL 10ML LL (SYRINGE) ×2 IMPLANT
UNDERPAD 30X36 HEAVY ABSORB (UNDERPADS AND DIAPERS) ×2 IMPLANT

## 2023-06-18 NOTE — Discharge Instructions (Addendum)
 HOME INSTRUCTIONS  Please note any unusual or excessive bleeding, pain, swelling. Mild dizziness or drowsiness are normal for about 24 hours after surgery.   Shower when comfortable  Restrictions: No driving for 24 hours or while taking pain medications.  Activity:  Nothing in vagina (no tampons, douching, or intercourse) x 2 weeks; no tub baths for 2 weeks Vaginal spotting is expected but if your bleeding is heavy, period like,  please call the office   Diet:  You may return to your regular diet.  Do not eat large meals.  Eat small frequent meals throughout the day.  Continue to drink a good amount of water at least 6-8 glasses of water per day, hydration is very important for the healing process.  Pain Management: Take over the counter tylenol  or ibuprofen  as needed for pain.  You can either take one or alternate between the two medications for pain management.  You may also use a heating pack as needed.    Alcohol -- Avoid for 24 hours and while taking pain medications.  Nausea: Take sips of ginger ale or soda  Fever -- Call physician if temperature over 101 degrees  Follow up:  Follow up if needed, please call the office at [phone].  If you experience fever (a temperature greater than 100.4), pain unrelieved by pain medication, shortness of breath, swelling of a single leg, or any other symptoms which are concerning to you please the office immediately.

## 2023-06-18 NOTE — Anesthesia Preprocedure Evaluation (Signed)
 Anesthesia Evaluation  Patient identified by MRN, date of birth, ID band Patient awake    Reviewed: Allergy & Precautions, H&P , NPO status , Patient's Chart, lab work & pertinent test results, reviewed documented beta blocker date and time   Airway Mallampati: II  TM Distance: >3 FB Neck ROM: full    Dental no notable dental hx.    Pulmonary asthma , former smoker   Pulmonary exam normal breath sounds clear to auscultation       Cardiovascular Exercise Tolerance: Good hypertension, + dysrhythmias  Rhythm:regular Rate:Normal     Neuro/Psych  PSYCHIATRIC DISORDERS Anxiety Depression    negative neurological ROS     GI/Hepatic negative GI ROS, Neg liver ROS,,,  Endo/Other  negative endocrine ROS    Renal/GU negative Renal ROS  negative genitourinary   Musculoskeletal   Abdominal   Peds  Hematology negative hematology ROS (+)   Anesthesia Other Findings   Reproductive/Obstetrics negative OB ROS                             Anesthesia Physical Anesthesia Plan  ASA: 2  Anesthesia Plan: General and General LMA   Post-op Pain Management:    Induction:   PONV Risk Score and Plan: Ondansetron   Airway Management Planned:   Additional Equipment:   Intra-op Plan:   Post-operative Plan:   Informed Consent: I have reviewed the patients History and Physical, chart, labs and discussed the procedure including the risks, benefits and alternatives for the proposed anesthesia with the patient or authorized representative who has indicated his/her understanding and acceptance.     Dental Advisory Given  Plan Discussed with: CRNA  Anesthesia Plan Comments:        Anesthesia Quick Evaluation

## 2023-06-18 NOTE — Transfer of Care (Signed)
 Immediate Anesthesia Transfer of Care Note  Patient: Mary Meyer  Procedure(s) Performed: HYSTEROSCOPY WITH CERVICAL POLYPECTOMY (Vagina )  Patient Location: PACU  Anesthesia Type:General  Level of Consciousness: drowsy and patient cooperative  Airway & Oxygen Therapy: Patient Spontanous Breathing and Patient connected to face mask oxygen  Post-op Assessment: Report given to RN and Post -op Vital signs reviewed and stable  Post vital signs: Reviewed and stable  Last Vitals:  Vitals Value Taken Time  BP 133/75 06/18/23 0945  Temp 36.4 C 06/18/23 0943  Pulse 92 06/18/23 0954  Resp 16 06/18/23 0954  SpO2 99 % 06/18/23 0954  Vitals shown include unfiled device data.  Last Pain:  Vitals:   06/18/23 0943  TempSrc:   PainSc: 0-No pain      Patients Stated Pain Goal: 6 (06/18/23 0805)  Complications: No notable events documented.

## 2023-06-18 NOTE — Anesthesia Procedure Notes (Addendum)
 Procedure Name: LMA Insertion Date/Time: 06/18/2023 9:15 AM  Performed by: Juluis Ok, CRNAPre-anesthesia Checklist: Patient identified, Emergency Drugs available, Suction available and Patient being monitored Patient Re-evaluated:Patient Re-evaluated prior to induction Oxygen Delivery Method: Circle system utilized Preoxygenation: Pre-oxygenation with 100% oxygen Induction Type: IV induction Ventilation: Mask ventilation without difficulty LMA: LMA inserted LMA Size: 4.0 Number of attempts: 1 Placement Confirmation: positive ETCO2, CO2 detector and breath sounds checked- equal and bilateral Tube secured with: Tape Dental Injury: Teeth and Oropharynx as per pre-operative assessment  Comments: LMA inserted by Jacques Mattock, EMT student with instruction by CRNA. Lips and teeth remain in preoperative condition.

## 2023-06-18 NOTE — Op Note (Signed)
 OPERATIVE NOTE  PREOPERATIVE DIAGNOSIS:  Irregular bleeding, cervical polyp POSTOPERATIVE DIAGNOSIS: same PROCEDURE PERFORMED: Hysteroscopy and dilation, cervical biopsy SURGEON: Dr. Keene Pastures ANESTHESIA: General endotracheal.  ESTIMATED BLOOD LOSS: minimal.  IV FLUIDS: 500cc of crystalloid.  SPECIMEN(S): cervical biopsy COMPLICATIONS: None.  CONDITION: Stable.  FINDINGS: 7cm uterus with normal proliferative endometrium.  Both ostia visualized.  Irregular tissue did not extend within the cervical canal.  ??Procedure: Informed consent was obtained from the patient prior to taking her to the operating room where anesthesia was found to be adequate. She was placed in dorsal lithotomy.  She was prepped and draped in normal sterile fashion.  A bivalved speculum was placed in the patient's vagina. A grounding pad placed on the patient. Cervix was visualized with polyp-like friable tissue along the posterior os extending slighting to the right side- ~ 1cm in size.  Anesthesia was administered via an intracervical block using 30 ml of 0.5% Lidocaine  with epinephrine . Hysteroscopy was performed to confirm extent of lesion.  The cervix was dilated using Hank dilators to accommodate the hysteroscopy.  The scope was inserted with the findings as visualized above.  It was removed, no perforation noted.  Attention was turned back to the cervical lesion.  The suction was turned on and the Extra Small 1X Fisher Cone Biopsy Excisor on 69 Watts of blended current was used to excise the abnormal area.  Excellent hemostasis was achieved using roller ball coagulation set at 50 Watts coagulation current. Monsel's solution was then applied and the speculum was removed from the vagina. Specimens were sent to pathology.  Counts were correct and pt was taken to recovery room in stable condition.  Anastasio Wogan, DO Attending Obstetrician & Gynecologist, [facility] for [employer], [facility]

## 2023-06-19 ENCOUNTER — Encounter (HOSPITAL_COMMUNITY): Payer: Self-pay | Admitting: Obstetrics & Gynecology

## 2023-06-19 LAB — SURGICAL PATHOLOGY

## 2023-06-20 ENCOUNTER — Encounter: Payer: Self-pay | Admitting: Obstetrics & Gynecology

## 2023-06-20 ENCOUNTER — Encounter: Payer: Medicaid Other | Admitting: Student

## 2023-06-20 ENCOUNTER — Telehealth (INDEPENDENT_AMBULATORY_CARE_PROVIDER_SITE_OTHER): Admitting: Student

## 2023-06-20 DIAGNOSIS — F419 Anxiety disorder, unspecified: Secondary | ICD-10-CM

## 2023-06-20 DIAGNOSIS — F329 Major depressive disorder, single episode, unspecified: Secondary | ICD-10-CM | POA: Diagnosis not present

## 2023-06-20 MED ORDER — BUSPIRONE HCL 15 MG PO TABS: 15.0000 mg | ORAL_TABLET | Freq: Two times a day (BID) | ORAL | 3 refills | Status: DC

## 2023-06-20 NOTE — Progress Notes (Unsigned)
  [facility] Internal Medicine Residency Telephone Encounter Continuity Care Appointment  HPI:  This telephone encounter was created for Ms. Mary Meyer on 06/21/2023 for the following purpose/cc GAD and MDD.    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  Pt states she had a stent placed during the cath/ablation, but records don't reflect this.  Cards appt 04/26/14 @ [facility]: EKG & Holter monitor all returned normal per pt, no further f/u needed per cards    Urinary tract infection without hematuria 07/10/2019   Vaginal Pap smear, abnormal       Assessment / Plan / Recommendations:  Depression Patients PHQ-9 score increased from 12 in February to 13 today. She denies recent life stressors. She denies SI/HI. She was on Wellbutrin  in the past and reported that this did not help her. Plan: -Continue Lexapro  20mg  daily -Psychiatry referral -Therapy referral placed  Anxiety Patients GAD- 7 score increased from 13 in February to 17. She does not believe the buspar  7.5 mg BID is helping. Plan: -Increase Buspar  to 15mg  BID -Psychiatry referral -Therapy referral placed    Consent and Medical Decision Making:  Patient discussed with Dr. Narendra This is a telephone encounter between Mary Meyer and Mary Meyer on 06/21/2023 for GAD, MDD. The visit was conducted with the patient located in the state of [place]  and Mary Meyer at [facility]. The patient's identity was confirmed using their DOB and current address. The  patient has consented to being evaluated through a telephone encounter and understands the associated risks (an examination cannot be done and the patient may need to come in for an appointment) / benefits (allows the patient to remain at home, decreasing exposure to coronavirus). I personally spent 15 minutes on medical discussion.

## 2023-06-21 NOTE — Assessment & Plan Note (Signed)
 Patients PHQ-9 score increased from 12 in February to 13 today. She denies recent life stressors. She denies SI/HI. She was on Wellbutrin  in the past and reported that this did not help her. Plan: -Continue Lexapro  20mg  daily -Psychiatry referral -Therapy referral placed

## 2023-06-21 NOTE — Anesthesia Postprocedure Evaluation (Signed)
 Anesthesia Post Note  Patient: Mary Meyer  Procedure(s) Performed: HYSTEROSCOPY WITH CERVICAL POLYPECTOMY (Vagina )  Patient location during evaluation: Phase II Anesthesia Type: General Level of consciousness: awake Pain management: pain level controlled Vital Signs Assessment: post-procedure vital signs reviewed and stable Respiratory status: spontaneous breathing and respiratory function stable Cardiovascular status: blood pressure returned to baseline and stable Postop Assessment: no headache and no apparent nausea or vomiting Anesthetic complications: no Comments: Late entry   No notable events documented.   Last Vitals:  Vitals:   06/18/23 0954 06/18/23 1004  BP:  (!) 128/92  Pulse: 92 94  Resp: 16 17  Temp:  36.7 C  SpO2: 99% 100%    Last Pain:  Vitals:   06/18/23 1004  TempSrc: Oral  PainSc:                  [name]

## 2023-06-21 NOTE — Assessment & Plan Note (Signed)
 Patients GAD- 7 score increased from 13 in February to 17. She does not believe the buspar  7.5 mg BID is helping. Plan: -Increase Buspar  to 15mg  BID -Psychiatry referral -Therapy referral placed

## 2023-06-28 NOTE — Progress Notes (Signed)
 Internal Medicine Clinic Attending  Case discussed with the resident at the time of the visit.  We reviewed the resident's history and exam and pertinent patient test results.  I agree with the assessment, diagnosis, and plan of care documented in the resident's note.

## 2023-07-03 ENCOUNTER — Other Ambulatory Visit: Payer: Self-pay | Admitting: Adult Health

## 2023-08-16 ENCOUNTER — Telehealth: Payer: Self-pay

## 2023-08-16 NOTE — Telephone Encounter (Signed)
 Has concerns about her periods.

## 2023-08-16 NOTE — Telephone Encounter (Signed)
 Returned patient's call. States her periods have been irregular since having procedure in April. Some periods last longer than others and having cramping which she has never had before.  Advised she should be seen since she has not been seen since procedure in April. Appt made for Monday.

## 2023-08-19 ENCOUNTER — Ambulatory Visit: Admitting: Obstetrics & Gynecology

## 2023-08-28 ENCOUNTER — Ambulatory Visit: Admitting: Obstetrics & Gynecology

## 2023-08-28 ENCOUNTER — Encounter: Payer: Self-pay | Admitting: Obstetrics & Gynecology

## 2023-08-28 VITALS — BP 135/94 | HR 69 | Ht 63.0 in | Wt 150.8 lb

## 2023-08-28 DIAGNOSIS — R030 Elevated blood-pressure reading, without diagnosis of hypertension: Secondary | ICD-10-CM

## 2023-08-28 DIAGNOSIS — N939 Abnormal uterine and vaginal bleeding, unspecified: Secondary | ICD-10-CM | POA: Diagnosis not present

## 2023-08-28 MED ORDER — NORETHINDRONE ACET-ETHINYL EST 1.5-30 MG-MCG PO TABS: 1.0000 | ORAL_TABLET | Freq: Every day | ORAL | 4 refills | Status: DC

## 2023-08-28 NOTE — Progress Notes (Signed)
 GYN VISIT Patient name: Mary Meyer MRN [id number]  Date of birth: [birth year] Chief Complaint:   Bleeding since polypectomy  History of Present Illness:   Mary Meyer is a 33 y.o. [phone] female being seen today for the following concerns:  AUB:  Cervical polypectomy completed 4/22 and since that time bleeding has been very irregular.  Since then maybe 5 separate episodes of what seems to be menses.  Typically will use 2 tampons per hour for about 3 days then stop for a few days then start back up. This intermittent bleeding has been ongoing for the past 3 mos.    Currently on OCPs and has been on this for many years- previously without any problems.    Denies postcoital bleeding.  Denies discharge, itching or irritation.  Denies pelvic or abdominal pain.  No LMP recorded. (Menstrual status: Irregular Periods).    Review of Systems:   Pertinent items are noted in HPI Denies fever/chills, dizziness, headaches, visual disturbances, fatigue, shortness of breath, chest pain, abdominal pain, vomiting Pertinent History Reviewed:   Past Surgical History:  Procedure Laterality Date   CARDIAC CATHETERIZATION  2009   states due to tacycardia, had alblation   CARDIAC CATHETERIZATION  2009   CERVICAL POLYPECTOMY N/A 06/18/2023   Procedure: HYSTEROSCOPY WITH CERVICAL POLYPECTOMY;  Surgeon: Mary Nest, DO;  Location: [facility];  Service: Gynecology;  Laterality: N/A;   CESAREAN SECTION N/A 10/03/2014   Procedure: CESAREAN SECTION;  Surgeon: Mary Mary Solomons, MD;  Location: [facility];  Service: Obstetrics;  Laterality: N/A;    Past Medical History:  Diagnosis Date   Alcohol use disorder, mild, abuse 07/18/2020   Anxiety    Asthma    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  Pt states she had a stent placed during the cath/ablation, but records don't reflect this.  Cards appt 04/26/14 @ [facility]: EKG & Holter monitor all returned normal per pt, no further f/u needed per cards    Urinary tract infection without hematuria 07/10/2019   Vaginal Pap smear, abnormal    Reviewed problem list, medications and allergies. Physical Assessment:   Vitals:   08/28/23 1357  BP: (!) 135/94  Pulse: 69  Weight: 150 lb 12.8 oz (68.4 kg)  Height: 5' 3 (1.6 m)  Body mass index is 26.71 kg/m.       Physical Examination:   General appearance: alert, well appearing, and in no distress  Psych: mood appropriate, normal affect  Skin: warm & dry   Cardiovascular: normal heart rate noted  Respiratory: normal respiratory effort, no distress  Abdomen: soft, non-tender   Pelvic: VULVA: normal appearing vulva with no masses, tenderness or lesions, VAGINA: normal appearing vagina with normal color and discharge, no lesions.  Small amount of BRB noted in vault consistent with menses.  CERVIX: normal appearing cervix without discharge or lesions  Extremities: no edema   Chaperone: Mary Meyer    Assessment & Plan:  1) AUB - Reassured patient of normal-appearing cervix, no lesion or masses noted - Based on exam finding does appear to be uterine/endometrial in nature - Will plan to transition to different COC to see  if she notes any improvement - Briefly discussed alternative options including Depo, Nexplanon, IUDs []  Should she not have any improvement with change in COC consider pelvic ultrasound to rule out underlying etiology though no abnormalities were seen on hysteroscope  2) Elevated BP -plan for repeat BP check in one week []  if elevated will transition from COC to POP  Meds ordered this encounter  Medications   Norethindrone Acetate-Ethinyl  Estradiol  (LOESTRIN) 1.5-30 MG-MCG tablet    Sig: Take 1 tablet by mouth daily.    Dispense:  90 tablet    Refill:  4     Return in about 3 months (around 11/28/2023) for Medication follow up, with Dr. [name] AND ONE WEEK BP check.   Mary Boyd, DO Attending Obstetrician & Gynecologist, [facility] for [employer], [facility]

## 2023-09-04 ENCOUNTER — Ambulatory Visit: Admitting: *Deleted

## 2023-09-04 VITALS — BP 134/86 | HR 84

## 2023-09-04 DIAGNOSIS — Z0130 Encounter for examination of blood pressure without abnormal findings: Secondary | ICD-10-CM

## 2023-09-04 NOTE — Progress Notes (Signed)
   NURSE VISIT- BLOOD PRESSURE CHECK  SUBJECTIVE:  Mary Meyer is a 33 y.o. [phone] female here for BP check. She is a GYN patient    HYPERTENSION ROS:    GYN patient: Taking medicines as instructed not applicable Headaches  No Chest pain No Shortness of breath No Swelling in legs/ankles No  OBJECTIVE:  BP 134/86 (BP Location: Right Arm, Patient Position: Sitting, Cuff Size: Normal)   Pulse 84   Appearance alert, well appearing, and in no distress.  ASSESSMENT: GYN  blood pressure check  PLAN: Note routed to Dr. Ozan   Recommendations: no changes needed   Follow-up: as scheduled   Alan LITTIE Fischer  09/04/2023 2:18 PM

## 2023-09-10 ENCOUNTER — Ambulatory Visit (INDEPENDENT_AMBULATORY_CARE_PROVIDER_SITE_OTHER): Admitting: Psychiatry

## 2023-09-10 ENCOUNTER — Encounter (HOSPITAL_COMMUNITY): Payer: Self-pay | Admitting: Psychiatry

## 2023-09-10 DIAGNOSIS — F32A Depression, unspecified: Secondary | ICD-10-CM | POA: Diagnosis not present

## 2023-09-10 DIAGNOSIS — F411 Generalized anxiety disorder: Secondary | ICD-10-CM

## 2023-09-10 NOTE — Progress Notes (Signed)
 SABRA
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
## 2023-10-08 ENCOUNTER — Ambulatory Visit (INDEPENDENT_AMBULATORY_CARE_PROVIDER_SITE_OTHER): Admitting: Psychiatry

## 2023-10-08 ENCOUNTER — Ambulatory Visit (HOSPITAL_COMMUNITY): Admitting: Psychiatry

## 2023-10-08 DIAGNOSIS — F411 Generalized anxiety disorder: Secondary | ICD-10-CM | POA: Diagnosis not present
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
## 2023-10-14 ENCOUNTER — Telehealth (HOSPITAL_COMMUNITY): Payer: Self-pay

## 2023-10-14 ENCOUNTER — Ambulatory Visit (INDEPENDENT_AMBULATORY_CARE_PROVIDER_SITE_OTHER): Admitting: Registered Nurse

## 2023-10-14 ENCOUNTER — Encounter (HOSPITAL_COMMUNITY): Payer: Self-pay | Admitting: Registered Nurse

## 2023-10-14 VITALS — BP 146/78 | HR 73 | Temp 97.9°F | Ht 63.0 in | Wt 152.2 lb

## 2023-10-14 DIAGNOSIS — R030 Elevated blood-pressure reading, without diagnosis of hypertension: Secondary | ICD-10-CM | POA: Diagnosis not present

## 2023-10-14 DIAGNOSIS — G4700 Insomnia, unspecified: Secondary | ICD-10-CM | POA: Diagnosis not present

## 2023-10-14 DIAGNOSIS — F331 Major depressive disorder, recurrent, moderate: Secondary | ICD-10-CM | POA: Diagnosis not present

## 2023-10-14 DIAGNOSIS — F411 Generalized anxiety disorder: Secondary | ICD-10-CM | POA: Diagnosis not present

## 2023-10-14 DIAGNOSIS — Z79899 Other long term (current) drug therapy: Secondary | ICD-10-CM | POA: Diagnosis not present

## 2023-10-14 DIAGNOSIS — H6691 Otitis media, unspecified, right ear: Secondary | ICD-10-CM | POA: Diagnosis not present

## 2023-10-14 DIAGNOSIS — J00 Acute nasopharyngitis [common cold]: Secondary | ICD-10-CM | POA: Diagnosis not present

## 2023-10-14 MED ORDER — BUSPIRONE HCL 15 MG PO TABS: 15.0000 mg | ORAL_TABLET | Freq: Three times a day (TID) | ORAL | 1 refills | Status: DC

## 2023-10-14 MED ORDER — ESCITALOPRAM OXALATE 20 MG PO TABS: 20.0000 mg | ORAL_TABLET | Freq: Every day | ORAL | 1 refills | Status: DC

## 2023-10-14 MED ORDER — QUETIAPINE FUMARATE ER 50 MG PO TB24: 50.0000 mg | ORAL_TABLET | Freq: Every day | ORAL | 1 refills | Status: DC
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
## 2023-10-14 NOTE — Patient Instructions (Addendum)

## 2023-10-14 NOTE — Telephone Encounter (Signed)
 Prior authorization for pt's quetiapine  approved from 10/14/23 to 10/13/24 pa case # [id number]

## 2023-11-11 ENCOUNTER — Ambulatory Visit (HOSPITAL_COMMUNITY): Admitting: Registered Nurse

## 2023-11-11 ENCOUNTER — Encounter (HOSPITAL_COMMUNITY): Payer: Self-pay | Admitting: Registered Nurse

## 2023-11-11 VITALS — BP 131/86 | HR 69 | Ht 63.0 in | Wt 154.2 lb

## 2023-11-11 DIAGNOSIS — F331 Major depressive disorder, recurrent, moderate: Secondary | ICD-10-CM | POA: Diagnosis not present

## 2023-11-11 DIAGNOSIS — G4700 Insomnia, unspecified: Secondary | ICD-10-CM

## 2023-11-11 DIAGNOSIS — Z79899 Other long term (current) drug therapy: Secondary | ICD-10-CM | POA: Diagnosis not present

## 2023-11-11 DIAGNOSIS — F411 Generalized anxiety disorder: Secondary | ICD-10-CM

## 2023-11-11 MED ORDER — QUETIAPINE FUMARATE 50 MG PO TABS: 50.0000 mg | ORAL_TABLET | Freq: Every day | ORAL | 1 refills | Status: AC

## 2023-11-11 MED ORDER — BUSPIRONE HCL 15 MG PO TABS: 15.0000 mg | ORAL_TABLET | Freq: Three times a day (TID) | ORAL | 1 refills | Status: AC

## 2023-11-11 MED ORDER — ARIPIPRAZOLE 2 MG PO TABS: 2.0000 mg | ORAL_TABLET | Freq: Every day | ORAL | Status: DC

## 2023-11-11 MED ORDER — ESCITALOPRAM OXALATE 20 MG PO TABS: 20.0000 mg | ORAL_TABLET | Freq: Every day | ORAL | 1 refills | Status: AC

## 2023-11-11 MED ORDER — ARIPIPRAZOLE 2 MG PO TABS: 2.0000 mg | ORAL_TABLET | Freq: Every day | ORAL | 1 refills | Status: AC
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
## 2023-11-11 NOTE — Addendum Note (Signed)
 Addended by: Hawthorne Day B on: 11/11/2023 09:43 AM   Modules accepted: Orders

## 2023-11-11 NOTE — Patient Instructions (Signed)

## 2023-11-12 LAB — CBC WITH DIFFERENTIAL/PLATELET
Absolute Lymphocytes: 1404 {cells}/uL (ref 850–3900)
Absolute Monocytes: 491 {cells}/uL (ref 200–950)
Basophils Absolute: 47 {cells}/uL (ref 0–200)
Basophils Relative: 0.6 %
Eosinophils Absolute: 172 {cells}/uL (ref 15–500)
Eosinophils Relative: 2.2 %
HCT: 43.1 % (ref 35.0–45.0)
Hemoglobin: 13.9 g/dL (ref 11.7–15.5)
MCH: 31.3 pg (ref 27.0–33.0)
MCHC: 32.3 g/dL (ref 32.0–36.0)
MCV: 97.1 fL (ref 80.0–100.0)
MPV: 10.9 fL (ref 7.5–12.5)
Monocytes Relative: 6.3 %
Neutro Abs: 5686 {cells}/uL (ref 1500–7800)
Neutrophils Relative %: 72.9 %
Platelets: 277 Thousand/uL (ref 140–400)
RBC: 4.44 Million/uL (ref 3.80–5.10)
RDW: 12.1 % (ref 11.0–15.0)
Total Lymphocyte: 18 %
WBC: 7.8 Thousand/uL (ref 3.8–10.8)

## 2023-11-12 LAB — COMPREHENSIVE METABOLIC PANEL WITH GFR
AG Ratio: 1.4 (calc) (ref 1.0–2.5)
ALT: 12 U/L (ref 6–29)
AST: 15 U/L (ref 10–30)
Albumin: 4.3 g/dL (ref 3.6–5.1)
Alkaline phosphatase (APISO): 51 U/L (ref 31–125)
BUN: 13 mg/dL (ref 7–25)
CO2: 25 mmol/L (ref 20–32)
Calcium: 10.1 mg/dL (ref 8.6–10.2)
Chloride: 106 mmol/L (ref 98–110)
Creat: 0.86 mg/dL (ref 0.50–0.97)
Globulin: 3 g/dL (ref 1.9–3.7)
Glucose, Bld: 79 mg/dL (ref 65–139)
Potassium: 5.1 mmol/L (ref 3.5–5.3)
Sodium: 139 mmol/L (ref 135–146)
Total Bilirubin: 0.4 mg/dL (ref 0.2–1.2)
Total Protein: 7.3 g/dL (ref 6.1–8.1)
eGFR: 91 mL/min/1.73m2 (ref >=60)

## 2023-11-12 LAB — LIPID PANEL
Cholesterol: 209 mg/dL — ABNORMAL HIGH (ref <200)
HDL: 54 mg/dL (ref >=50)
LDL Cholesterol (Calc): 125 mg/dL — ABNORMAL HIGH
Non-HDL Cholesterol (Calc): 155 mg/dL — ABNORMAL HIGH (ref <130)
Total CHOL/HDL Ratio: 3.9 (calc) (ref <5.0)
Triglycerides: 179 mg/dL — ABNORMAL HIGH (ref <150)

## 2023-11-12 LAB — PROLACTIN: Prolactin: 13.2 ng/mL

## 2023-11-12 LAB — TSH: TSH: 1.54 m[IU]/L

## 2023-11-12 LAB — MAGNESIUM: Magnesium: 2.2 mg/dL (ref 1.5–2.5)

## 2023-11-12 LAB — HCG, SERUM, QUALITATIVE: Preg, Serum: NEGATIVE

## 2023-11-20 ENCOUNTER — Encounter (HOSPITAL_COMMUNITY): Payer: Self-pay

## 2023-11-20 ENCOUNTER — Ambulatory Visit (HOSPITAL_COMMUNITY): Admitting: Psychiatry

## 2023-11-20 ENCOUNTER — Telehealth (HOSPITAL_COMMUNITY): Payer: Self-pay | Admitting: Psychiatry

## 2023-11-20 NOTE — Telephone Encounter (Signed)
 Therapist attempted to contact patient via text through caregility platform for scheduled appointment, no response.  Therapist called patient, left message indicating attempt, and requesting patient call office.

## 2023-11-28 ENCOUNTER — Ambulatory Visit: Admitting: Obstetrics & Gynecology

## 2023-11-28 ENCOUNTER — Encounter: Payer: Self-pay | Admitting: Obstetrics & Gynecology

## 2023-11-28 VITALS — BP 138/84 | HR 78 | Ht 63.0 in | Wt 159.2 lb

## 2023-11-28 DIAGNOSIS — Z30011 Encounter for initial prescription of contraceptive pills: Secondary | ICD-10-CM | POA: Diagnosis not present

## 2023-11-28 DIAGNOSIS — N939 Abnormal uterine and vaginal bleeding, unspecified: Secondary | ICD-10-CM

## 2023-11-28 MED ORDER — LEVONORGEST-ETH ESTRAD 91-DAY 0.15-0.03 &0.01 MG PO TABS: 1.0000 | ORAL_TABLET | Freq: Every day | ORAL | 4 refills | Status: AC

## 2023-11-28 NOTE — Progress Notes (Signed)
 GYN VISIT Patient name: Mary Meyer MRN [id number]  Date of birth: [birth year] Chief Complaint:   Follow-up  History of Present Illness:   Mary Meyer is a 33 y.o. [phone] female being seen today for follow up regarding:  AUB: At her last visit she was transition to a different OCP, due irregular/intermenstrual bleeding.  Today she notes that the new pill has stopped this problem and she actually does not have a period at all.  While this pill has been working well for her-she wishes to discuss decreased libido.  She notes this has been an ongoing issue.  She otherwise reports no acute GYN concerns-denies dyspareunia, pelvic pain.  No LMP recorded. (Menstrual status: Oral contraceptives).    Review of Systems:   Pertinent items are noted in HPI Denies fever/chills, dizziness, headaches, visual disturbances, fatigue, shortness of breath, chest pain, abdominal pain, vomiting, no problems with periods, bowel movements, urination, or intercourse unless otherwise stated above.  Pertinent History Reviewed:   Past Surgical History:  Procedure Laterality Date   CARDIAC CATHETERIZATION  2009   states due to tacycardia, had alblation   CARDIAC CATHETERIZATION  2009   CERVICAL POLYPECTOMY N/A 06/18/2023   Procedure: HYSTEROSCOPY WITH CERVICAL POLYPECTOMY;  Surgeon: Marilynn Nest, DO;  Location: [facility];  Service: Gynecology;  Laterality: N/A;   CESAREAN SECTION N/A 10/03/2014   Procedure: CESAREAN SECTION;  Surgeon: Lynwood KANDICE Solomons, MD;  Location: [facility];  Service: Obstetrics;  Laterality: N/A;    Past Medical History:  Diagnosis Date   Alcohol use disorder, mild, abuse 07/18/2020   Anxiety    Asthma    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  Pt states she had a stent placed during the cath/ablation, but records don't reflect this.  Cards appt 04/26/14 @ [facility]: EKG & Holter monitor all returned normal per pt, no further f/u needed per cards    Urinary tract infection without hematuria 07/10/2019   Vaginal Pap smear, abnormal    Reviewed problem list, medications and allergies. Physical Assessment:   Vitals:   11/28/23 0850  BP: 138/84  Pulse: 78  Weight: 159 lb 3.2 oz (72.2 kg)  Height: 5' 3 (1.6 m)  Body mass index is 28.2 kg/m.       Physical Examination:   General appearance: alert, well appearing, and in no distress  Psych: mood appropriate, normal affect  Skin: warm & dry   Cardiovascular: normal heart rate noted  Respiratory: normal respiratory effort, no distress  Extremities: no edema   Chaperone: N/A    Assessment & Plan:  1) Contraceptive management, Decreased libido - While this pill has been better with her irregular bleeding, unfortunately suspect libido changes are due to the pill - Discussed alternative options including alternative pill, transition to LARC or nonhormonal option - Will trial different type of pill and encourage patient to give it at least 3 to 4 months before calling to let us  know any issues   No orders of the defined types were placed in this encounter.   Return for Feb 2026 annual.   Salsabeel Gorelick, DO Attending Obstetrician & Gynecologist, Faculty  [facility] for [employer], [facility]

## 2023-12-04 ENCOUNTER — Ambulatory Visit (HOSPITAL_COMMUNITY): Admitting: Psychiatry

## 2023-12-18 ENCOUNTER — Ambulatory Visit (HOSPITAL_COMMUNITY): Admitting: Psychiatry

## 2024-01-02 ENCOUNTER — Ambulatory Visit (HOSPITAL_COMMUNITY): Admitting: Psychiatry

## 2024-01-07 ENCOUNTER — Ambulatory Visit (INDEPENDENT_AMBULATORY_CARE_PROVIDER_SITE_OTHER): Admitting: Psychiatry

## 2024-01-07 ENCOUNTER — Encounter (HOSPITAL_COMMUNITY): Payer: Self-pay

## 2024-01-07 DIAGNOSIS — F411 Generalized anxiety disorder: Secondary | ICD-10-CM | POA: Diagnosis not present

## 2024-01-07 DIAGNOSIS — F331 Major depressive disorder, recurrent, moderate: Secondary | ICD-10-CM | POA: Diagnosis not present
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
## 2024-01-13 ENCOUNTER — Ambulatory Visit (HOSPITAL_COMMUNITY): Admitting: Registered Nurse

## 2024-01-21 ENCOUNTER — Ambulatory Visit (HOSPITAL_COMMUNITY): Admitting: Psychiatry

## 2024-01-21 ENCOUNTER — Encounter (HOSPITAL_COMMUNITY): Payer: Self-pay

## 2024-01-27 ENCOUNTER — Telehealth (HOSPITAL_COMMUNITY): Admitting: Registered Nurse

## 2024-02-04 ENCOUNTER — Ambulatory Visit (INDEPENDENT_AMBULATORY_CARE_PROVIDER_SITE_OTHER): Admitting: Psychiatry

## 2024-02-04 ENCOUNTER — Telehealth (HOSPITAL_COMMUNITY): Payer: Self-pay | Admitting: Psychiatry

## 2024-02-04 NOTE — Progress Notes (Signed)
 Therapist attempted to contact patient via phone for scheduled appointment and received voicemail recording.  Therapist left a message indicating attempt and requesting patient call office.

## 2024-02-04 NOTE — Telephone Encounter (Signed)
 Therapist attempted to contact patient via phone for scheduled appointment and received voice recording.  Therapist left message indicating attempt and requesting patient call office.

## 2024-03-06 ENCOUNTER — Telehealth (HOSPITAL_COMMUNITY): Payer: Self-pay | Admitting: Psychiatry

## 2024-03-06 ENCOUNTER — Encounter (HOSPITAL_COMMUNITY): Payer: Self-pay | Admitting: Psychiatry

## 2024-03-06 ENCOUNTER — Ambulatory Visit (HOSPITAL_COMMUNITY): Admitting: Psychiatry

## 2024-03-06 NOTE — Telephone Encounter (Signed)
 Therapist attempted to contact patient via phone for scheduled appointment and received voicemail recording.  Therapist left message indicating attempt and requested patient call office.

## 2024-03-20 ENCOUNTER — Ambulatory Visit (HOSPITAL_COMMUNITY): Admitting: Psychiatry

## 2024-04-02 ENCOUNTER — Encounter: Payer: Self-pay | Admitting: Student

## 2024-06-30 ENCOUNTER — Ambulatory Visit: Payer: Self-pay | Admitting: Student
